# Patient Record
Sex: Female | Born: 1950 | Race: White | Hispanic: No | Marital: Married | Smoking: Current every day smoker
Health system: Southern US, Community
[De-identification: ages and names within clinical notes are randomized; demographics above are authoritative.]

## PROBLEM LIST (undated history)

## (undated) DIAGNOSIS — I219 Acute myocardial infarction, unspecified: Secondary | ICD-10-CM

## (undated) DIAGNOSIS — M8718 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, jaw: Secondary | ICD-10-CM

## (undated) DIAGNOSIS — I2510 Atherosclerotic heart disease of native coronary artery without angina pectoris: Secondary | ICD-10-CM

## (undated) DIAGNOSIS — I10 Essential (primary) hypertension: Secondary | ICD-10-CM

## (undated) DIAGNOSIS — I499 Cardiac arrhythmia, unspecified: Secondary | ICD-10-CM

## (undated) DIAGNOSIS — F17200 Nicotine dependence, unspecified, uncomplicated: Secondary | ICD-10-CM

## (undated) DIAGNOSIS — I639 Cerebral infarction, unspecified: Secondary | ICD-10-CM

## (undated) DIAGNOSIS — Z87442 Personal history of urinary calculi: Secondary | ICD-10-CM

## (undated) DIAGNOSIS — N200 Calculus of kidney: Secondary | ICD-10-CM

## (undated) DIAGNOSIS — K219 Gastro-esophageal reflux disease without esophagitis: Secondary | ICD-10-CM

## (undated) DIAGNOSIS — I739 Peripheral vascular disease, unspecified: Secondary | ICD-10-CM

## (undated) DIAGNOSIS — J449 Chronic obstructive pulmonary disease, unspecified: Secondary | ICD-10-CM

## (undated) DIAGNOSIS — E785 Hyperlipidemia, unspecified: Secondary | ICD-10-CM

## (undated) DIAGNOSIS — J189 Pneumonia, unspecified organism: Secondary | ICD-10-CM

## (undated) DIAGNOSIS — T7840XA Allergy, unspecified, initial encounter: Secondary | ICD-10-CM

## (undated) HISTORY — DX: Calculus of kidney: N20.0

## (undated) HISTORY — DX: 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, jaw: M87.180

## (undated) HISTORY — DX: Chronic obstructive pulmonary disease, unspecified: J44.9

## (undated) HISTORY — DX: Atherosclerotic heart disease of native coronary artery without angina pectoris: I25.10

## (undated) HISTORY — DX: Essential (primary) hypertension: I10

## (undated) HISTORY — DX: Allergy, unspecified, initial encounter: T78.40XA

## (undated) HISTORY — PX: CORONARY ARTERY BYPASS GRAFT: SHX141

## (undated) HISTORY — DX: Cerebral infarction, unspecified: I63.9

## (undated) HISTORY — PX: WISDOM TOOTH EXTRACTION: SHX21

## (undated) HISTORY — DX: Hyperlipidemia, unspecified: E78.5

## (undated) HISTORY — DX: Acute myocardial infarction, unspecified: I21.9

## (undated) HISTORY — DX: Nicotine dependence, unspecified, uncomplicated: F17.200

## (undated) HISTORY — PX: KIDNEY SURGERY: SHX687

---

## 1983-10-17 HISTORY — PX: OTHER SURGICAL HISTORY: SHX169

## 1995-05-16 DIAGNOSIS — I2510 Atherosclerotic heart disease of native coronary artery without angina pectoris: Secondary | ICD-10-CM

## 1995-05-16 HISTORY — DX: Atherosclerotic heart disease of native coronary artery without angina pectoris: I25.10

## 1998-03-17 ENCOUNTER — Ambulatory Visit (HOSPITAL_COMMUNITY): Admission: RE | Admit: 1998-03-17 | Discharge: 1998-03-17 | Payer: Self-pay | Admitting: Internal Medicine

## 1998-09-20 ENCOUNTER — Encounter: Payer: Self-pay | Admitting: Internal Medicine

## 1998-09-20 ENCOUNTER — Ambulatory Visit (HOSPITAL_COMMUNITY): Admission: RE | Admit: 1998-09-20 | Discharge: 1998-09-20 | Payer: Self-pay | Admitting: Internal Medicine

## 1998-12-27 ENCOUNTER — Other Ambulatory Visit: Admission: RE | Admit: 1998-12-27 | Discharge: 1998-12-27 | Payer: Self-pay | Admitting: *Deleted

## 1998-12-27 ENCOUNTER — Encounter (INDEPENDENT_AMBULATORY_CARE_PROVIDER_SITE_OTHER): Payer: Self-pay

## 2000-05-29 ENCOUNTER — Other Ambulatory Visit: Admission: RE | Admit: 2000-05-29 | Discharge: 2000-05-29 | Payer: Self-pay | Admitting: *Deleted

## 2000-06-06 ENCOUNTER — Encounter: Payer: Self-pay | Admitting: Internal Medicine

## 2000-06-06 ENCOUNTER — Encounter: Admission: RE | Admit: 2000-06-06 | Discharge: 2000-06-06 | Payer: Self-pay | Admitting: Internal Medicine

## 2000-06-11 ENCOUNTER — Other Ambulatory Visit: Admission: RE | Admit: 2000-06-11 | Discharge: 2000-06-11 | Payer: Self-pay | Admitting: *Deleted

## 2000-06-11 ENCOUNTER — Encounter (INDEPENDENT_AMBULATORY_CARE_PROVIDER_SITE_OTHER): Payer: Self-pay

## 2004-07-31 ENCOUNTER — Encounter: Admission: RE | Admit: 2004-07-31 | Discharge: 2004-07-31 | Payer: Self-pay | Admitting: Family Medicine

## 2005-10-16 ENCOUNTER — Emergency Department (HOSPITAL_COMMUNITY): Admission: EM | Admit: 2005-10-16 | Discharge: 2005-10-16 | Payer: Self-pay | Admitting: Emergency Medicine

## 2005-10-16 ENCOUNTER — Encounter: Payer: Self-pay | Admitting: Internal Medicine

## 2006-06-17 HISTORY — PX: COLONOSCOPY: SHX174

## 2006-11-05 ENCOUNTER — Ambulatory Visit: Payer: Self-pay | Admitting: Gastroenterology

## 2006-11-13 ENCOUNTER — Encounter: Admission: RE | Admit: 2006-11-13 | Discharge: 2006-11-13 | Payer: Self-pay | Admitting: Family Medicine

## 2006-11-21 ENCOUNTER — Ambulatory Visit: Payer: Self-pay | Admitting: Gastroenterology

## 2007-04-13 ENCOUNTER — Ambulatory Visit: Payer: Self-pay | Admitting: Vascular Surgery

## 2007-04-14 ENCOUNTER — Inpatient Hospital Stay (HOSPITAL_COMMUNITY): Admission: RE | Admit: 2007-04-14 | Discharge: 2007-04-15 | Payer: Self-pay | Admitting: Cardiovascular Disease

## 2007-04-18 HISTORY — PX: CAROTID ARTERY - SUBCLAVIAN ARTERY BYPASS GRAFT: SUR178

## 2007-05-01 ENCOUNTER — Ambulatory Visit: Payer: Self-pay | Admitting: Vascular Surgery

## 2010-03-07 ENCOUNTER — Encounter: Admission: RE | Admit: 2010-03-07 | Discharge: 2010-03-07 | Payer: Self-pay | Admitting: Family Medicine

## 2010-10-30 NOTE — Assessment & Plan Note (Signed)
OFFICE VISIT   [name], Jamie  DOB:  17-Apr-1951                                       05/01/2007  CHART#:[id number]   [name] was in today for follow-up of her left carotid  histoplavian bypass on 04/14/07.  She has done well since discharge from  the hospital.  Has had neurologic deficits and does not report any  fatigue in her left arm.  Her left neck incision is well healed.  She  does have palpable radial pulses bilaterally.  Her right arm blood  pressure is 135/72, left arm is 124/71, pulse 70, respirations 16.  O2  saturations are 99% on room air.  She remains neurologically intact.  She will be released to return to full work on 05/05/07 aside from heavy  lifting.  I will see her again in 6 months and we will arrange a carotid  duplex at [facility] prior to my visit with her in  six months.   Larina Earthly, M.D.  Electronically Signed   TFE/MEDQ  D:  05/01/2007  T:  05/04/2007  Job:  [id number]   cc:   Nanetta Batty, M.D.

## 2010-10-30 NOTE — Cardiovascular Report (Signed)
NAMESALLYANN, KINNAIRD NO.:  [id number]   MEDICAL RECORD NO.:  [id number]          PATIENT TYPE:  OBV   LOCATION:  6533                         FACILITY:  [facility]   PHYSICIAN:  Nanetta Batty, M.D.   DATE OF BIRTH:  08/04/1950   DATE OF PROCEDURE:  DATE OF DISCHARGE:                            CARDIAC CATHETERIZATION   Dictating a RCA aortogram, left subclavian artery angiogram, attempted  PTCA procedure.   Ms. Zender is a 60 year old white female, history of CAD status post  coronary bypass grafting x1 with a LIMA to her LAD in 1996.  She was  recently cathed by Dr. 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  She does complain of exertional chest pain in the left upper  extremity claudication (LIMA steal).  She presents now for potential  percutaneous/endovascular revascularization.   DESCRIPTION OF PROCEDURE:  The patient brought to the second floor [place]  of PV angiographic suite, in the postabsorptive state.  She was pre-  medicated with p.o. Valium.  Her right groin was prepped and shaved in  the usual sterile fashion.  One percent Xylocaine was used for local  anesthesia.  A 6-French sheath was inserted into the right femoral  artery, using the standard Seldinger technique.  A 6-French pigtail  catheter was used for arch angiography.  Visipaque dye was used for the  entirety of the case.  Retrograde aortic pressures were monitored during  the case.  The patient received a total of 3,000 units of heparin  peripherally and an additional 2000 units via the brachial side-arm  sheath.   Attempts to pass a Glidewire antegrade across the left subclavian were  unsuccessful.  Left brachial access was then obtained using a  micropuncture technique and a short 6-French sheath.  Heparin 2000 units  was administered through it's side arm and was sewn securely in place.  Using a  6-French short-guide catheter along with a 3.5 Wholey wire, a  angled Glidewire, a 4-French end-hole catheter and choice PT moderate  support, attempts were made retrograde recanalization.  The lumen was  visible; however, the wire was never able to be passed.  There was a  small to moderate-sized dissection on the lateral aspect of the  subclavian artery, which was contained. It did not encroach on the  vertebral or IMA.  The procedure was abandoned.  An ACT was measured.  The guide catheter was removed.  The sheaths were sewn securely in  place.  The patient left the lab in stable condition.  Both sheaths will  be removed, once the ACT falls below 170.  I will get the vein and  vascular surgeons to review her case.  The arch  aortogram did show a widely patent left common carotid, with no evidence  of bifurcation disease by duplex ultrasound.  She is a good candidate  for carotid subclavian bypass.  I have reviewed the films of with Dr.  Woodfin Ganja, as well.  Dr. Jenne Campus was notified of these results.  The  patient  left the lab in stable condition.      Nanetta Batty, M.D.  Electronically Signed     JB/MEDQ  D:  04/13/2007  T:  04/13/2007  Job:  [id number]   cc:   Patient Chart  PV Angiographic Suite 2nd Floor  [place]  [facility]  Dr. Frazier Richards, [facility]

## 2010-10-30 NOTE — Discharge Summary (Signed)
[name], [name] NO.:  [id number]   MEDICAL RECORD NO.:  [id number]          PATIENT TYPE:  INP   LOCATION:  3302                         FACILITY:  [facility]   PHYSICIAN:  [name], M.D.   DATE OF BIRTH:  1951/05/05   DATE OF ADMISSION:  04/13/2007  DATE OF DISCHARGE:  04/15/2007                               DISCHARGE SUMMARY   DISCHARGE DIAGNOSES:  1. Peripheral vascular disease, attempted left subclavian percutaneous      transluminal angioplasty this admission. The patient ultimately      required left carotid subclavian bypass by Dr. Arbie Cookey on April 14, 2007.  2. Coronary disease.  The patient had bypass surgery in 1996.  A      recent catheterization this year revealed a 70% lesion in the IMA      prior to the insertion site, ramus and obtuse marginal had no      significant disease, a 60% right coronary artery and a 99% osteal      left subclavian stenosis, with an ejection fraction of 60%.   HOSPITAL COURSE:  She is seen in the office in followup, and setup for  outpatient subclavian artery PTA attempt.  Plan for her coronary disease  just continue medical therapy.  On April 13, 2007, Dr. Allyson [name] attempted  subclavian artery PTA that was unsuccessful.  The case was discussed  with Dr. Arbie Cookey.  The patient was taken to the OR on April 14, 2007,  for a left common carotid artery to left subclavian artery bypass  grafting.  We feel she can be discharged on April 15, 2007.  She will  follow up with Dr. Allyson [name].      [name], P.A.      [name], M.D.  Electronically Signed    LKK/MEDQ  D:  04/15/2007  T:  04/15/2007  Job:  [id number]   cc:   Larina Earthly, M.D.

## 2010-10-30 NOTE — Op Note (Signed)
[name], [name] NO.:  [id number]   MEDICAL RECORD NO.:  [id number]          PATIENT TYPE:  OBV   LOCATION:  2550                         FACILITY:  [facility]   PHYSICIAN:  Larina Earthly, M.D.    DATE OF BIRTH:  [birth year]   DATE OF PROCEDURE:  04/14/2007  DATE OF DISCHARGE:                               OPERATIVE REPORT   PREOPERATIVE DIAGNOSIS:  Left subclavian occlusion.   POSTOPERATIVE DIAGNOSIS:  Left subclavian occlusion.   PROCEDURE:  Left common carotid to subclavian bypass with a 7 mm  Hemashield graft.   SURGEON:  Larina Earthly, M.D.   ASSISTANT:  Jerold Coombe, P.A.   ANESTHESIA:  General endotracheal.   COMPLICATIONS:  None.   DISPOSITION:  To recovery room stable   INDICATIONS FOR PROCEDURE:  The patient is a 60 year old white female  with premature atherosclerotic disease.  She is status post coronary  artery bypass grafting in the mid 1990s with a LIMA graft.  She was  found to have an concluded subclavian artery and had an attempted  crossing and angioplasty of this the day prior to this surgery and this  was unsuccessful.  She was admitted and is taken to the operating room  at this time for left carotid subclavian bypass.   PROCEDURE IN DETAIL:  The patient was taken to the operating room and  placed in the supine position.  The area of the left neck and chest was  prepped and draped in the usual sterile fashion. Incision was made above  the level of the clavicle and carried down through the platysma.  The  clavicular head of the sternocleidomastoid was divided.  The phrenic  nerve was identified and was preserved.  The subclavian artery was  isolated, it was of small caliber, it had minimal calcification.  The  common carotid artery was identified through the same incision.  The  vagus and jugular veins were identified and preserved.  A tunnel was  created anterior to the scalene muscles.  This was behind the level of  the  jugular vein.  The patient was given 6000 units of intravenous  heparin.  The common carotid artery was occluded proximally and  distally.  The patient's blood pressure was kept in the 150-160 systolic  range.  The common carotid artery was opened with an 11 blade and  extended  with Potts' scissors.  A 7 mm Hemashield graft was brought  onto the field and was sewn end-to-side to the common carotid artery  with a running 6-0 Prolene suture.  After the usual flushing maneuvers,  the clamp was placed on the graft and removed from the common carotid  artery.  The graft was then brought into approximation on the subclavian  artery.  The subclavian artery was occluded proximally and distally, and  this was distal to the level of the vertebral takeoff and distal to the  LIMA takeoff.  The artery was opened longitudinally.  The graft was cut  to the appropriate length, was spatulated, and sewn end-to-side to the  subclavian artery with a running 6-0 Prolene  suture.  Prior to  completion of the anastomosis, the usual flush maneuvers were  undertaken.  Anastomosis was completed and flow was restored to the left  subclavian.  The patient was given 50 mg of protamine to reverse the  heparin.  The wounds were irrigated with saline.  Hemostasis with  electrocautery.  The clavicular head of the sternocleidomastoid was  closed with several  interrupted 3-0 Vicryl sutures.  The platysma was closed with a running  3-0 Vicryl suture.  The skin was closed with a 4-0 subcuticular Vicryl  stitch.  Sterile dressing was applied and the patient was awakened  neurologically intact and was transferred to the recovery room in stable  condition.      Larina Earthly, M.D.  Electronically Signed     TFE/MEDQ  D:  04/14/2007  T:  04/14/2007  Job:  [id number]   cc:   Nanetta Batty, M.D.  Darlin Priestly, MD

## 2010-10-30 NOTE — Discharge Summary (Signed)
[name], [name] NO.:  [id number]   MEDICAL RECORD NO.:  [id number]          PATIENT TYPE:  INP   LOCATION:  3302                         FACILITY:  [facility]   PHYSICIAN:  Larina Earthly, M.D.    DATE OF BIRTH:  August 04, 1950   DATE OF ADMISSION:  04/13/2007  DATE OF DISCHARGE:  04/15/2007                               DISCHARGE SUMMARY   DISCHARGE DIAGNOSIS:  Subtotal occlusion of left subclavian artery.   SECONDARY DIAGNOSES:  1. Left subclavian occlusion with unsuccessful angioplasty and now      status post left common carotid to subclavian artery bypass.  2. History of coronary artery disease, status post coronary artery      bypass grafting with the left internal mammary artery to the left      anterior descending with retrograde filling.  3. Ongoing tobacco use.  4. Dyslipidemia.   ALLERGIES:  NO KNOWN DRUG ALLERGIES.   PROCEDURE:  1. On April 13, 2007, she had arterial aortogram type 1 showing      99.9% left subclavian artery stenosis with retrograde left      ventricular filling.  She had attempted  angioplasty, but Dr. Allyson Sabal was unable to cross the lesion.  1. On April 14, 2007, left common carotid to subclavian artery      bypass using 7-mm Hemashield graft by Dr. Gretta Began.   BRIEF HISTORY:  [name] is a 60 year old white female with premature  atherosclerotic disease.  She is status post coronary artery bypass  graft in the mid 1990s with a LIMA graft.  Recent catheterization, she  was found to have occluded subclavian artery (99%.)  She reportedly had  complained of decreased strength her left upper arm and was noted to  have a 30 mmHg pressure difference within the right and left arm.  She  had no resting ischemia.  Therefore, Dr. Jenne Campus recommended that she  undergo left subclavian angiogram with possible PTA and stenting by his  partner Dr. Nanetta Batty.  She was started on Plavix in preparation  for this procedure.   HOSPITAL  COURSE:  [name] was admitted to [facility]  initially under the care of Dr. Nanetta Batty.  She was admitted on  April 13, 2007.  She underwent arteriogram as discussed above.  Dr.  Allyson Sabal was unable to perform angioplasty on her left subclavian artery.  Therefore, a vascular surgery consultation was requested.  She was seen  by Dr. Tawanna Cooler Early.  It was noted that with reviewing of her prior  arteriogram films, he noted dissection of the subclavian artery distal  to the occlusion.  Therefore, he felt the left carotid to subclavian  artery bypass would be the best treatment option for her symptoms.  This  was performed on April 14, 2007.  Postoperatively, she was transferred  to stepdown unit 3300 and at this point remained hemodynamically stable.  Her left hand remains well perfused.  Neurologically, she was intact.  Her vitals are stable.   LABORATORY DATA:  Postoperative labs show a sodium of 138, potassium  3.5, chloride 105, CO2  28, blood glucose 0.7, BUN 5, creatinine 0.51,  white count 7.1, hemoglobin 1.5, hematocrit 34.2, platelet count 195.   Postoperative, it is felt that if she tolerates breakfast, ambulates  without difficulty, voids following removal of her Foley catheter and  there are no significant changes in her status, that she will be able to  be discharged later on postoperative day 1, April 15, 2007.  Currently, she remains in stable condition.   DISCHARGE MEDICATIONS:  1. Oxycodone 5 mg 1-2 tablets p.o. q.4 h. p.r.n. pain.  2. Simvastatin 20 mg q.h.s.  3. Lexapro 10 mg p.o. daily.  4. Aspirin 325 mg daily.  5. Metoprolol ER 25 mg p.o. daily.  6. Multivitamin p.o. daily.  7. Plavix 75 mg p.o. daily unless otherwise directed by her      cardiologist.   DISCHARGE INSTRUCTIONS:  1. She is to resume a heart-healthy diet.  2. Increase her activity slowly.  3. Avoid driving or heavy lifting for the next 2-3 weeks.  4. She may shower and clean her  incisions gently with soap and water.  5. She should call if she develops fever greater than 101, redness or      drainage from her incision sites, increased pain or numbness in her      left hand.   FOLLOW UP:  1. She is to see Dr. Arbie Cookey in approximately 2 weeks.  Our office will      contact her regarding specific appointment date and time.  2. She should follow up with Dr. Allyson Sabal or Dr. Jenne Campus as directed.      Jerold Coombe, P.A.      Larina Earthly, M.D.  Electronically Signed    AWZ/MEDQ  D:  04/15/2007  T:  04/15/2007  Job:  [id number]   cc:   Larina Earthly, M.D.  Darlin Priestly, MD  Nanetta Batty, M.D.  [address] [place], [place]

## 2011-03-27 LAB — BASIC METABOLIC PANEL
BUN: 5 — ABNORMAL LOW
BUN: 9
CO2: 27
Calcium: 9.6
Calcium: 9.8
Creatinine, Ser: 0.51
GFR calc non Af Amer: >60
Glucose, Bld: 107 — ABNORMAL HIGH
Glucose, Bld: 90
Potassium: 4
Sodium: 142

## 2011-03-27 LAB — CBC
HCT: 36.8
Hemoglobin: 12.5
MCHC: 34
Platelets: 195
Platelets: 222
RDW: 15.1 — ABNORMAL HIGH
RDW: 15.2 — ABNORMAL HIGH
WBC: 7.1

## 2015-01-18 ENCOUNTER — Encounter: Payer: Self-pay | Admitting: Family Medicine

## 2015-01-23 ENCOUNTER — Ambulatory Visit: Payer: BLUE CROSS/BLUE SHIELD | Admitting: Physician Assistant

## 2015-01-30 ENCOUNTER — Ambulatory Visit (INDEPENDENT_AMBULATORY_CARE_PROVIDER_SITE_OTHER): Payer: BLUE CROSS/BLUE SHIELD | Admitting: Physician Assistant

## 2015-01-30 ENCOUNTER — Encounter: Payer: Self-pay | Admitting: Physician Assistant

## 2015-01-30 VITALS — BP 130/70 | HR 76 | Temp 98.0°F | Resp 16 | Ht 66.0 in | Wt 98.0 lb

## 2015-01-30 DIAGNOSIS — E559 Vitamin D deficiency, unspecified: Secondary | ICD-10-CM | POA: Diagnosis not present

## 2015-01-30 DIAGNOSIS — Z23 Encounter for immunization: Secondary | ICD-10-CM

## 2015-01-30 DIAGNOSIS — Z0000 Encounter for general adult medical examination without abnormal findings: Secondary | ICD-10-CM

## 2015-01-30 DIAGNOSIS — J439 Emphysema, unspecified: Secondary | ICD-10-CM | POA: Diagnosis not present

## 2015-01-30 DIAGNOSIS — M810 Age-related osteoporosis without current pathological fracture: Secondary | ICD-10-CM | POA: Insufficient documentation

## 2015-01-30 DIAGNOSIS — Z951 Presence of aortocoronary bypass graft: Secondary | ICD-10-CM | POA: Insufficient documentation

## 2015-01-30 DIAGNOSIS — Z9889 Other specified postprocedural states: Secondary | ICD-10-CM

## 2015-01-30 DIAGNOSIS — I708 Atherosclerosis of other arteries: Secondary | ICD-10-CM

## 2015-01-30 DIAGNOSIS — I771 Stricture of artery: Secondary | ICD-10-CM | POA: Insufficient documentation

## 2015-01-30 DIAGNOSIS — E785 Hyperlipidemia, unspecified: Secondary | ICD-10-CM | POA: Diagnosis not present

## 2015-01-30 DIAGNOSIS — Z905 Acquired absence of kidney: Secondary | ICD-10-CM

## 2015-01-30 DIAGNOSIS — I2510 Atherosclerotic heart disease of native coronary artery without angina pectoris: Secondary | ICD-10-CM

## 2015-01-30 DIAGNOSIS — Z87442 Personal history of urinary calculi: Secondary | ICD-10-CM

## 2015-01-30 DIAGNOSIS — Z720 Tobacco use: Secondary | ICD-10-CM | POA: Diagnosis not present

## 2015-01-30 DIAGNOSIS — R5383 Other fatigue: Secondary | ICD-10-CM | POA: Diagnosis not present

## 2015-01-30 DIAGNOSIS — Z95828 Presence of other vascular implants and grafts: Secondary | ICD-10-CM

## 2015-01-30 DIAGNOSIS — F17200 Nicotine dependence, unspecified, uncomplicated: Secondary | ICD-10-CM | POA: Insufficient documentation

## 2015-01-30 LAB — CBC WITH DIFFERENTIAL/PLATELET
Basophils Absolute: 0.1 10*3/uL (ref 0.0–0.1)
Basophils Relative: 1 % (ref 0–1)
EOS ABS: 0.1 10*3/uL (ref 0.0–0.7)
EOS PCT: 2 % (ref 0–5)
HCT: 45.4 % (ref 36.0–46.0)
Hemoglobin: 15.2 g/dL — ABNORMAL HIGH (ref 12.0–15.0)
LYMPHS ABS: 1.6 10*3/uL (ref 0.7–4.0)
Lymphocytes Relative: 25 % (ref 12–46)
MCH: 32 pg (ref 26.0–34.0)
MCHC: 33.5 g/dL (ref 30.0–36.0)
MCV: 95.6 fL (ref 78.0–100.0)
MONO ABS: 0.5 10*3/uL (ref 0.1–1.0)
MONOS PCT: 8 % (ref 3–12)
MPV: 8.7 fL (ref 8.6–12.4)
Neutro Abs: 4.1 10*3/uL (ref 1.7–7.7)
Neutrophils Relative %: 64 % (ref 43–77)
PLATELETS: 272 10*3/uL (ref 150–400)
RBC: 4.75 MIL/uL (ref 3.87–5.11)
RDW: 14.7 % (ref 11.5–15.5)
WBC: 6.4 10*3/uL (ref 4.0–10.5)

## 2015-01-30 LAB — COMPLETE METABOLIC PANEL WITH GFR
ALT: 6 U/L (ref 6–29)
AST: 21 U/L (ref 10–35)
Albumin: 3.9 g/dL (ref 3.6–5.1)
Alkaline Phosphatase: 45 U/L (ref 33–130)
BILIRUBIN TOTAL: 0.5 mg/dL (ref 0.2–1.2)
BUN: 16 mg/dL (ref 7–25)
CO2: 27 mmol/L (ref 20–31)
Calcium: 10.3 mg/dL (ref 8.6–10.4)
Chloride: 106 mmol/L (ref 98–110)
Creat: 0.79 mg/dL (ref 0.50–0.99)
GFR, EST NON AFRICAN AMERICAN: 80 mL/min (ref >=60)
Glucose, Bld: 85 mg/dL (ref 70–99)
Potassium: 5.9 mmol/L — ABNORMAL HIGH (ref 3.5–5.3)
Sodium: 139 mmol/L (ref 135–146)
TOTAL PROTEIN: 6.6 g/dL (ref 6.1–8.1)

## 2015-01-30 LAB — LIPID PANEL
Cholesterol: 201 mg/dL — ABNORMAL HIGH (ref 125–200)
HDL: 71 mg/dL (ref >=46)
LDL CALC: 119 mg/dL (ref <130)
TRIGLYCERIDES: 53 mg/dL (ref <150)
Total CHOL/HDL Ratio: 2.8 Ratio (ref <=5.0)
VLDL: 11 mg/dL (ref <30)

## 2015-01-30 NOTE — Progress Notes (Signed)
Patient ID: [name] MRN: [id number], DOB: [birth year], 64 y.o. Date of Encounter: @DATE @  Chief Complaint:  Chief Complaint  Patient presents with  . Follow-up    Re-Establish Care    HPI: 64 y.o. year old white female  presents to re-establish care.  01/30/2015: I REVIEWED HER PAPER CHART AND ABSTRACTED ALL IMPORTANT INFORMATION INTO EPIC  Her last office visit here in this office was 07/06/2010. She reports that since then, she has had no follow-up and no medical care. She says that any time she is around her friends-- they seem to have all types of aches and pains and problems-- and that she felt that she was in much better shape than any of them so she felt that she really did not need to be coming in. Says that because of her bypass surgery and having a kidney removed in the past, that she has been eating a very healthy diet because of the instructions she was given at the time of those surgeries. Says that she has continued to follow the diet given back then. Says that she has a stationary bike and she actually uses hers!! Says that she mows her grass using a riding mower as well as a push mower. Says that she has continued all of these habits and is very active and has been feeling fine so she felt that she could just go without coming into a doctor's office.  However, she recently decided that she probably better come in and get checked just to make sure.  States that she has been having no angina type symptoms. States that she has been feeling well.   Past Medical History/Problem List:   1. Atherosclerosis of native coronary artery of native heart without angina pectoris  2. History of coronary artery bypass surgery 05/20/1995 she underwent coronary artery bypass grafting by Dr. Particia Lather with a LIMA to the LAD. Cardiac catheterization had revealed 95% proximal LAD lesion too close to the left main for endovascular interventional treatment.  CABG was indicated  after a positive stress test, for relief of symptoms, and for myocardial preservation.  3. Subclavian artery stenosis, left She had complained of some mild chest discomfort so underwent a Cardiolite scan 02/23/2007. This revealed evidence of mild anteroseptal ischemia with normal EF. She was also noted to have unequal upper extremity blood pressures on her initial visit with Dr. Jenne Campus with blood pressure 120/70 in the right arm and 90/60 in the left arm. This raised suspicion for left subclavian stenosis.  She underwent left subclavian ultrasound 03/05/2007. Revealed widely patent right and left internal carotids. There were abnormal waveform in the left subclavian suggesting > 50% stenosis. It was felt that she had probable left subclavian stenosis which was concerning that may be compromising her LIMA to her LAD given her abnormal Cardiolite scan. She underwent cardiac catheterization 03/27/2007 revealing 50% proximal LAD lesion. The IMA was visualized with 70% lesion in the IMA prior to the insertion site. The ramus and OM had no significant disease. There was 60% lesion in the ostial portion of the RCA. This showed to have 99% ostial left subclavian stenosis with calcification and TIMI 1 flow into the left subclavian. She was scheduled to undergo left subclavian angiogram with possible PTA/stenting by Dr. Allyson Sabal on 04/13/2007. However, failed percutaneous revascularization.  Had left carotid -  subclavian bypass in 2008.  4. S/P vascular bypass ------ left carotid -  subclavian bypass in 2008.  5. Hyperlipidemia When she was  seeing me in the past she was on Crestor 20 mg daily.  6. Smoker  7. Vitamin D deficiency  8. Osteoporosis DEXA scan 03/07/2010 showed: Lumbar spine----- T score -2.5 Left femur---------- T score -1.6 Patient's diagnostic category was osteoporosis by criteria. At that time, she was prescribed Fosamax 70 mg 1 every week. She was also to take 1200 mg calcium daily.  August 2011 vitamin D level had been low and she was given prescription strength vitamin D and was to take this.    Home Meds: None  Allergies: No Known Allergies  Social History   Social History  . Marital Status: Married    Spouse Name: N/A  . Number of Children: N/A  . Years of Education: N/A   Occupational History  . Not on file.   Social History Main Topics  . Smoking status: Current Every Day Smoker -- 0.25 packs/day for 40 years    Types: Cigarettes  . Smokeless tobacco: Not on file  . Alcohol Use: Not on file  . Drug Use: No  . Sexual Activity: Yes   Other Topics Concern  . Not on file   Social History Narrative    Family History  Problem Relation Age of Onset  . Depression Sister      Review of Systems:  See HPI for pertinent ROS. All other ROS negative.    Physical Exam: Blood pressure 130/70, pulse 76, temperature 98 F (36.7 C), temperature source Oral, resp. rate 16, height  (1.676 m), weight 98 lb (44.453 kg)., Body mass index is 15.83 kg/(m^2). General: Thin, WNWD WF. Appears in no acute distress. Neck: Supple. No thyromegaly. No lymphadenopathy. She has soft carotid bruits bilaterally. She has a soft left subclavian bruit as well. Lungs: Clear bilaterally to auscultation without wheezes, rales, or rhonchi. Breathing is unlabored. Heart: RRR with S1 S2. No murmurs, rubs, or gallops. Abdomen: Soft, non-tender, non-distended with normoactive bowel sounds. No hepatomegaly. No rebound/guarding. No obvious abdominal masses. Musculoskeletal:  Strength and tone normal for age. Extremities/Skin: Warm and dry. No edema.  Neuro: Alert and oriented X 3. Moves all extremities spontaneously. Gait is normal. CNII-XII grossly in tact. Psych:  Responds to questions appropriately with a normal affect.     ASSESSMENT AND PLAN:  64 y.o. year old female with   1. Atherosclerosis of native coronary artery of native heart without angina pectoris - Ambulatory  referral to Cardiology--- added comments to schedule this with Dr. Allyson Sabal as patient has seen him in the past and he will have her records.  2. History of coronary artery bypass surgery - Ambulatory referral to Cardiology--- added comments to schedule this with Dr. Allyson Sabal as patient has seen him in the past and he will have her records.  3. Subclavian artery stenosis, left - Ambulatory referral to Cardiology--- added comments to schedule this with Dr. Allyson Sabal as patient has seen him in the past and he will have her records.  4. S/P vascular bypass - Ambulatory referral to Cardiology--- added comments to schedule this with Dr. Allyson Sabal as patient has seen him in the past and he will have her records.  5. Hyperlipidemia - COMPLETE METABOLIC PANEL WITH GFR - Lipid panel - Ambulatory referral to Cardiology  6. Smoker - Ambulatory referral to Cardiology ----------------------------NEED TO DISCUSS CESSATION, CHANTIX AT NEXT OV----------------------------------  7. Vitamin D deficiency - Vit D  25 hydroxy (rtn osteoporosis monitoring)  8. Osteoporosis - Vit D  25 hydroxy (rtn osteoporosis monitoring)   9.  Other fatigue - CBC with Differential/Platelet - COMPLETE METABOLIC PANEL WITH GFR - TSH  Screening Labs:  CBC, CMET, FLP, TSH, Vitamin D---checked 01/30/2015  10. Immunizations:  She is agreeable to receive a T dap today as well as an Pneumovax 23 today.----These are both given here 01/30/2015.  She is agreeable to schedule a complete physical exam with me in the upcoming 1-2 weeks. She is fasting today so we went ahead and did full set of labs today and gave above immunizations.  Will update further preventive care at her upcoming CPE. Also will further discuss Zostavax at that visit. Also will do pelvic exam with Pap smear, order mammogram, order DEXA scan.  01/30/2015: I REVIEWED HER PAPER CHART AND ABSTRACTED ALL IMPORTANT INFORMATION INTO EPIC  Signed, Shon Hale In[place], [place],  [facility] 01/30/2015 12:07 PM

## 2015-01-31 LAB — TSH: TSH: 0.811 u[IU]/mL (ref 0.350–4.500)

## 2015-01-31 LAB — VITAMIN D 25 HYDROXY (VIT D DEFICIENCY, FRACTURES): Vit D, 25-Hydroxy: 21 ng/mL — ABNORMAL LOW (ref 30–100)

## 2015-02-01 ENCOUNTER — Encounter: Payer: Self-pay | Admitting: Physician Assistant

## 2015-02-01 DIAGNOSIS — Z905 Acquired absence of kidney: Secondary | ICD-10-CM | POA: Insufficient documentation

## 2015-02-01 DIAGNOSIS — J449 Chronic obstructive pulmonary disease, unspecified: Secondary | ICD-10-CM | POA: Insufficient documentation

## 2015-02-01 DIAGNOSIS — Z87442 Personal history of urinary calculi: Secondary | ICD-10-CM | POA: Insufficient documentation

## 2015-02-03 ENCOUNTER — Other Ambulatory Visit: Payer: Self-pay | Admitting: *Deleted

## 2015-02-03 DIAGNOSIS — E785 Hyperlipidemia, unspecified: Secondary | ICD-10-CM

## 2015-02-03 MED ORDER — SIMVASTATIN 20 MG PO TABS: 20.0000 mg | ORAL_TABLET | Freq: Every day | ORAL | Status: DC

## 2015-02-13 ENCOUNTER — Encounter: Payer: Self-pay | Admitting: Physician Assistant

## 2015-02-13 ENCOUNTER — Ambulatory Visit (INDEPENDENT_AMBULATORY_CARE_PROVIDER_SITE_OTHER): Payer: BLUE CROSS/BLUE SHIELD | Admitting: Physician Assistant

## 2015-02-13 VITALS — BP 120/80 | HR 76 | Temp 97.6°F | Resp 18 | Wt 98.0 lb

## 2015-02-13 DIAGNOSIS — J439 Emphysema, unspecified: Secondary | ICD-10-CM | POA: Diagnosis not present

## 2015-02-13 DIAGNOSIS — I2510 Atherosclerotic heart disease of native coronary artery without angina pectoris: Secondary | ICD-10-CM

## 2015-02-13 DIAGNOSIS — Z0000 Encounter for general adult medical examination without abnormal findings: Secondary | ICD-10-CM

## 2015-02-13 DIAGNOSIS — Z87442 Personal history of urinary calculi: Secondary | ICD-10-CM

## 2015-02-13 DIAGNOSIS — I708 Atherosclerosis of other arteries: Secondary | ICD-10-CM | POA: Diagnosis not present

## 2015-02-13 DIAGNOSIS — E785 Hyperlipidemia, unspecified: Secondary | ICD-10-CM

## 2015-02-13 DIAGNOSIS — F17200 Nicotine dependence, unspecified, uncomplicated: Secondary | ICD-10-CM

## 2015-02-13 DIAGNOSIS — M810 Age-related osteoporosis without current pathological fracture: Secondary | ICD-10-CM | POA: Diagnosis not present

## 2015-02-13 DIAGNOSIS — Z95828 Presence of other vascular implants and grafts: Secondary | ICD-10-CM

## 2015-02-13 DIAGNOSIS — E559 Vitamin D deficiency, unspecified: Secondary | ICD-10-CM | POA: Diagnosis not present

## 2015-02-13 DIAGNOSIS — Z905 Acquired absence of kidney: Secondary | ICD-10-CM | POA: Diagnosis not present

## 2015-02-13 DIAGNOSIS — I771 Stricture of artery: Secondary | ICD-10-CM

## 2015-02-13 DIAGNOSIS — Z9889 Other specified postprocedural states: Secondary | ICD-10-CM

## 2015-02-13 DIAGNOSIS — Z720 Tobacco use: Secondary | ICD-10-CM | POA: Diagnosis not present

## 2015-02-13 DIAGNOSIS — Z951 Presence of aortocoronary bypass graft: Secondary | ICD-10-CM

## 2015-02-13 NOTE — Progress Notes (Signed)
Patient ID: [name] MRN: [id number], DOB: [birth year], 64 y.o. Date of Encounter: @DATE @  Chief Complaint:  Chief Complaint  Patient presents with  . Follow-up    CPE    HPI: 64 y.o. year old white female  Presents for CPE.   THE FOLLOWING IS COPIED FROM HER OV NOTE WITH ME 01/30/2015:    01/30/2015: I REVIEWED HER PAPER CHART AND ABSTRACTED ALL IMPORTANT INFORMATION INTO EPIC  She presents to re-establish care. Her last office visit here in this office was 07/06/2010. She reports that since then, she has had no follow-up and no medical care. She says that any time she is around her friends-- they seem to have all types of aches and pains and problems-- and that she felt that she was in much better shape than any of them so she felt that she really did not need to be coming in. Says that because of her bypass surgery and having a kidney removed in the past, that she has been eating a very healthy diet because of the instructions she was given at the time of those surgeries. Says that she has continued to follow the diet given back then. Says that she has a stationary bike and she actually uses hers!! Says that she mows her grass using a riding mower as well as a push mower. Says that she has continued all of these habits and is very active and has been feeling fine so she felt that she could just go without coming into a doctor's office.  However, she recently decided that she probably better come in and get checked just to make sure.  States that she has been having no angina type symptoms. States that she has been feeling well.   AT THAT INITIAL OV ON 01/30/2015: Checked Full Panel of Labs Ordered Referral to Cardiology Gave Tdap, Pneumovax 23 Discussed her returning for CPE and she was agreeable.  I reviewed her paper chart and abstracted information into Epic   02/13/2015--Today-- She presents for CPE. Has no complaints or concerns to address.     Past Medical  History/Problem List:   1. Atherosclerosis of native coronary artery of native heart without angina pectoris  2. History of coronary artery bypass surgery 05/20/1995 she underwent coronary artery bypass grafting by Dr. Particia Lather with a LIMA to the LAD. Cardiac catheterization had revealed 95% proximal LAD lesion too close to the left main for endovascular interventional treatment.  CABG was indicated after a positive stress test, for relief of symptoms, and for myocardial preservation.  3. Subclavian artery stenosis, left She had complained of some mild chest discomfort so underwent a Cardiolite scan 02/23/2007. This revealed evidence of mild anteroseptal ischemia with normal EF. She was also noted to have unequal upper extremity blood pressures on her initial visit with Dr. Jenne Campus with blood pressure 120/70 in the right arm and 90/60 in the left arm. This raised suspicion for left subclavian stenosis.  She underwent left subclavian ultrasound 03/05/2007. Revealed widely patent right and left internal carotids. There were abnormal waveform in the left subclavian suggesting > 50% stenosis. It was felt that she had probable left subclavian stenosis which was concerning that may be compromising her LIMA to her LAD given her abnormal Cardiolite scan. She underwent cardiac catheterization 03/27/2007 revealing 50% proximal LAD lesion. The IMA was visualized with 70% lesion in the IMA prior to the insertion site. The ramus and OM had no significant disease. There was 60% lesion in  the ostial portion of the RCA. This showed to have 99% ostial left subclavian stenosis with calcification and TIMI 1 flow into the left subclavian. She was scheduled to undergo left subclavian angiogram with possible PTA/stenting by Dr. Allyson Sabal on 04/13/2007. However, failed percutaneous revascularization.  Had left carotid -  subclavian bypass in 2008.  4. S/P vascular bypass ------ left carotid -  subclavian bypass in  2008.  5. Hyperlipidemia When she was seeing me in the past she was on Crestor 20 mg daily.  6. Smoker  7. Vitamin D deficiency  8. Osteoporosis DEXA scan 03/07/2010 showed: Lumbar spine----- T score -2.5 Left femur---------- T score -1.6 Patient's diagnostic category was osteoporosis by criteria. At that time, she was prescribed Fosamax 70 mg 1 every week. She was also to take 1200 mg calcium daily. August 2011 vitamin D level had been low and she was given prescription strength vitamin D and was to take this.    Home Meds: None  Allergies: No Known Allergies  Social History   Social History  . Marital Status: Married    Spouse Name: N/A  . Number of Children: N/A  . Years of Education: N/A   Occupational History  . Not on file.   Social History Main Topics  . Smoking status: Current Every Day Smoker -- 0.25 packs/day for 40 years    Types: Cigarettes  . Smokeless tobacco: Not on file  . Alcohol Use: Not on file  . Drug Use: No  . Sexual Activity: Yes   Other Topics Concern  . Not on file   Social History Narrative    Family History  Problem Relation Age of Onset  . Depression Sister   . COPD Father   . Cancer Sister     hysterectomy sec to cancer  . Cancer Sister     cervical cancer  . COPD Brother      Review of Systems:  See HPI for pertinent ROS. All other ROS negative.    Physical Exam: Blood pressure 120/80, pulse 76, temperature 97.6 F (36.4 C), temperature source Oral, resp. rate 18, weight 98 lb (44.453 kg)., Body mass index is 15.83 kg/(m^2). General: Thin, WNWD WF. Appears in no acute distress. HEENT: Eye Exam Normal. Ears: Bilateral Ear canals with desquamation, peeling skin. Mouth: Oral Mucosa normal. Posterior Pharynx normal.  Neck: Supple. No thyromegaly. No lymphadenopathy. She has soft carotid bruits bilaterally. She has a soft left subclavian bruit as well. Lungs: Clear bilaterally to auscultation without wheezes, rales, or  rhonchi. Breathing is unlabored. Heart: RRR with S1 S2. No murmurs, rubs, or gallops. Breast Exam: Normal Bilaterally. No masses. No nipple discharge. No skin changes.  Abdomen: Soft, non-tender, non-distended with normoactive bowel sounds. No hepatomegaly. No rebound/guarding. No obvious abdominal masses. Musculoskeletal:  Strength and tone normal for age. Pelvic Exam: External Genitalia: Normal. Vaginal Mucosa: Normal. Cervix: Normal. Bimanual Exam: Normal. Normal uterus size. No adnexal mass, no tenderness.  Extremities/Skin: Warm and dry. No edema. I noticed she has desquamation/peeling/ in bilateral ear canals--asked pt if she has other areas---she pulls back her bangs and shows me a patch of peeling/desquamation at upper forehead/hairline. I also noticed patches of rash on bilateral anterolateral ankles and a few other scattered patches as well. Neuro: Alert and oriented X 3. Moves all extremities spontaneously. Gait is normal. CNII-XII grossly in tact. Psych:  Responds to questions appropriately with a normal affect.     ASSESSMENT AND PLAN:  64 y.o. year old female with  1. Visit for preventive health examination  A. Screening Labs: 01/30/2015--Did Labs: CBC--------Normal CMET-----Normal FLP--------LDL--119--started Simvastatin ---At OV 02/13/15-she says she IS taking this TSH-------Normal Vit D------21--Recommended to start otc Vit D 2,000 IU QD---At OV 02/13/15- she says she IS taking this  B. Pap Smear: Pap Smear sent 02/13/2015 - PAP, Thin Prep w/HPV rflx HPV Type 16/18  C. Screening Mammogram: At CPE 02/13/2015---she is agreeable for me to schedule f/u Mammogram - MM Digital Screening; Future  D. DEXA: At CPE 02/13/2015--she is agreeable for me to schedule f/u DEXA - DG Bone Density; Future  E. Screening for Colorectal Cancer: Pt reports she has had one colonoscopy in past---was performed at [facility] I CHECKED EPIC---SEE NO MENTION OF THIS IN ENCOUNTERS,  PROCEDURES, ETC I CHECKED PAPER CHART---CANNOT FIND COPY OF THIS AT NEXT OV--WILL HAVE PT SIGN RELEASE TO GET RECORD/FIND OUT DATE OF LAST COLONOSCOPY  F. Immunizations:  Influenza Vaccine--------------N/A---August T dap-----------------------------Given here 01/30/2015 Pneumovax 23 -----------------Given here 01/30/2015. No further Pneumonia Vaccine indicated until age 42 Zostavax:   I Wrote this on her AVS 02/13/2015--to remind her--to call insurance regarding coverage, then call us and let us know.     Rash:  See "Skin" Physical Exam Findings Documented above.----------------------WILL ADDRESS THIS AT NEXT OV----------------------------------    1. Atherosclerosis of native coronary artery of native heart without angina pectoris -I ordered Referral at OV 01/30/2015---- Ambulatory referral to Cardiology--- added comments to schedule this with Dr. Allyson Sabal as patient has seen him in the past and he will have her records.  2. History of coronary artery bypass surgery - -I ordered Referral at OV 01/30/2015-Ambulatory referral to Cardiology--- added comments to schedule this with Dr. Allyson Sabal as patient has seen him in the past and he will have her records.  3. Subclavian artery stenosis, left --I ordered Referral at OV 01/30/2015- Ambulatory referral to Cardiology--- added comments to schedule this with Dr. Allyson Sabal as patient has seen him in the past and he will have her records.  4. S/P vascular bypass --I ordered Referral at OV 01/30/2015- Ambulatory referral to Cardiology--- added comments to schedule this with Dr. Allyson Sabal as patient has seen him in the past and he will have her records.  5. Hyperlipidemia - COMPLETE METABOLIC PANEL WITH GFR - Lipid panel  6. Smoker At OV 02/13/2015--Discussed Medication for Cessation She says she wants to work on "cutting back on her own first"----"if that doesn't work, then would try medication to help with this"--------------F/U AT NEXT  OV------------------------  7. Vitamin D deficiency At OV 01/30/2015--Checked Vitamin D Level---Was low at 21---At f/u OV she confirms that she IS taking the otc Vitamin D 2,000 IU as directed - DG Bone Density; Future  8. Osteoporosis At OV 01/30/2015--Checked Vitamin D Level---Was low at 21---At f/u OV she confirms that she IS taking the otc Vitamin D 2,000 IU as directed - DG Bone Density; Future She is a smoker. --Review of Paper Chart: DEXA scan 03/07/2010 showed: Lumbar spine----- T score -2.5 Left femur---------- T score -1.6 Patient's diagnostic category was osteoporosis by criteria. At that time, she was prescribed Fosamax 70 mg 1 every week. She was also to take 1200 mg calcium daily. August 2011 vitamin D level had been low and she was given prescription strength vitamin D and was to take this.  9. Solitary kidney, acquired 10. 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 Dr. Etta Grandchild. Staghorn Calculus, Left Kidney Nonfunctioning, With Acute and Chronic Pyelonephritis  At CPE 02/13/2015---Discussed with pt ---Needs f/u  with Urology--- She agrees to f/u with them in future, but wants to wait for me to order this referral at next OV---Right now she is going to Cardiology appt, Mammo, DEXA--------------------------------ORDER THIS AT NEXT OV------------------------------------------------------------------------   01/30/2015: I REVIEWED HER PAPER CHART AND ABSTRACTED ALL IMPORTANT INFORMATION INTO EPIC  WILL PLAN F/U OV 3 MONTHS, SOONER IF NEEDED. SHE IS AWARE TO RETURN FASTING FOR FLP/LFT 6 WEEKS AFTER STARTING [address] [place], [place], [facility] 02/13/2015 8:47 AM

## 2015-02-17 LAB — PAP, THIN PREP W/HPV RFLX HPV TYPE 16/18: HPV DNA HIGH RISK: NOT DETECTED

## 2015-02-22 ENCOUNTER — Encounter: Payer: Self-pay | Admitting: Family Medicine

## 2015-03-01 ENCOUNTER — Encounter: Payer: Self-pay | Admitting: Cardiology

## 2015-03-01 ENCOUNTER — Ambulatory Visit (INDEPENDENT_AMBULATORY_CARE_PROVIDER_SITE_OTHER): Payer: BLUE CROSS/BLUE SHIELD | Admitting: Cardiology

## 2015-03-01 VITALS — BP 168/100 | HR 84 | Ht 66.0 in | Wt 96.5 lb

## 2015-03-01 DIAGNOSIS — Z951 Presence of aortocoronary bypass graft: Secondary | ICD-10-CM | POA: Diagnosis not present

## 2015-03-01 DIAGNOSIS — I2510 Atherosclerotic heart disease of native coronary artery without angina pectoris: Secondary | ICD-10-CM | POA: Diagnosis not present

## 2015-03-01 NOTE — Patient Instructions (Signed)
Your physician wants you to follow-up in: 1 Year. You will receive a reminder letter in the mail two months in advance. If you don't receive a letter, please call our office to schedule the follow-up appointment.  Your physician has requested that you have an exercise tolerance test. For further information please visit [email or web address]. Please also follow instruction sheet, as given.    

## 2015-03-01 NOTE — Progress Notes (Signed)
Cardiology Office Note   Date:  03/01/2015   ID:  [name], DOB 1951-05-25, MRN [id number]  PCP:  Frazier Richards, PA-C  Cardiologist:   Rollene Rotunda, MD   Chief Complaint  Patient presents with  . Chest Pain      History of Present Illness: [name] is a 64 y.o. female who presents for evaluation of known coronary disease. She has a history of CABG in 1996. 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  She has not been seen in a while. She's been off her medications. She did recently have a lipid profile with an LDL slightly elevated and she was restarted on a statin.  She actually has done well. She has a physical active job. The patient denies any new symptoms such as chest discomfort, neck or arm discomfort. There has been no new shortness of breath, PND or orthopnea. There have been no reported palpitations, presyncope or syncope.  She still smokes.    Past Medical History  Diagnosis Date  . Allergy   . Myocardial infarction   . Hypertension   . Hyperlipidemia   . Stroke   . COPD (chronic obstructive pulmonary disease)   . Smoker   . Coronary artery disease 05/16/1995--Cath    05/20/1995--CABG x 1-----LIMA to LAD  . Nephrolithiasis     Left Nephrectomy     Past Surgical History  Procedure Laterality Date  . Coronary artery bypass graft    . Kidney surgery Left     64 yo  . Left nephrectomy Left 10/17/1983    Dr. Sural---StagHornCalculus, Left Kidney Nonfunctioning wiht Acute and Chronic Pyelonephritis  . Carotid artery - subclavian artery bypass graft Left 04/18/2007     Current Outpatient Prescriptions  Medication Sig Dispense Refill  . aspirin 81 MG tablet Take 81 mg by mouth daily.    . simvastatin (ZOCOR) 20 MG tablet Take 1 tablet (20 mg total) by mouth at bedtime. 30 tablet 1   No current facility-administered medications for this visit.    Allergies:   Review of patient's allergies indicates no known  allergies.    Social History:  The patient  reports that she has been smoking Cigarettes.  She has a 10 pack-year smoking history. She does not have any smokeless tobacco history on file. She reports that she does not use illicit drugs.   Family History:  The patient's family history includes COPD in her brother and father; Cancer in her sister and sister; Depression in her sister.    ROS:  Please see the history of present illness.   Otherwise, review of systems are positive for none.   All other systems are reviewed and negative.    PHYSICAL EXAM: VS:  BP 168/100 mmHg  Pulse 84  Ht  (1.676 m)  Wt 96 lb 8 oz (43.772 kg)  BMI 15.58 kg/m2 , BMI Body mass index is 15.58 kg/(m^2). GENERAL:  Well appearing HEENT:  Pupils equal round and reactive, fundi not visualized, oral mucosa unremarkable NECK:  No jugular venous distention, waveform within normal limits, carotid upstroke brisk and symmetric, no bruits, no thyromegaly LYMPHATICS:  No cervical, inguinal adenopathy LUNGS:  Clear to auscultation bilaterally BACK:  No CVA tenderness CHEST:  Unremarkable HEART:  PMI not displaced or sustained,S1 and S2 within normal limits, no S3, no S4, no clicks, no rubs, no murmurs ABD:  Flat, positive bowel sounds normal in frequency in pitch, no bruits,  no rebound, no guarding, no midline pulsatile mass, no hepatomegaly, no splenomegaly EXT:  2 plus pulses throughout, no edema, no cyanosis no clubbing SKIN:  No rashes no nodules NEURO:  Cranial nerves II through XII grossly intact, motor grossly intact throughout PSYCH:  Cognitively intact, oriented to person place and time    EKG:  EKG is ordered today. The ekg ordered today demonstrates sinus rhythm, rate 84, axis within normal limits, intervals within normal limits, left atrial enlargement, right atrial enlargement, nonspecific lateral T-wave flattening.   Recent Labs: 01/30/2015: ALT 6; BUN 16; Creat 0.79; Hemoglobin 15.2*; Platelets  272; Potassium 5.9*; Sodium 139; TSH 0.811    Lipid Panel    Component Value Date/Time   CHOL 201* 01/30/2015 1206   TRIG 53 01/30/2015 1206   HDL 71 01/30/2015 1206   CHOLHDL 2.8 01/30/2015 1206   VLDL 11 01/30/2015 1206   LDLCALC 119 01/30/2015 1206      Wt Readings from Last 3 Encounters:  03/01/15 96 lb 8 oz (43.772 kg)  02/13/15 98 lb (44.453 kg)  01/30/15 98 lb (44.453 kg)      Other studies Reviewed: Additional studies/ records that were reviewed today include: Old office records including an extensive [facility] paper chart.  Review of the above records demonstrates:  Please see elsewhere in the note.     ASSESSMENT AND PLAN:  CAD:  She has no ongoing symptoms but she needs aggressive risk reduction. Like to also screen her. I will bring the patient back for a POET (Plain Old Exercise Test). This will allow me to screen for obstructive coronary disease, risk stratify and very importantly provide a prescription for exercise.  TOBACCO:  We discussed the need to stop smoking at length. She will let me know if she wants a prescription.  DYSLIPIDEMIA:  Patient's LDL was elevated as above. She's going to get this checked again in November with a goal less than 70. She is now taking statin.  HTN:  This is a very unusual blood pressure for her. It was the same in both arms. She's not seen elevated for like this in previous readings have not been as high. She needs to give me a blood pressure diary in the next month so I can understand whether she needs further treatment or not. We talked to her about this.  ANXIETY:  She did describe this. I certainly see no contraindication to medical therapy for this but I will defer to [facility] BETH, PA-C.  Anxiolytics or antidepressives would be fine from a cardiovascular standpoint.   Current medicines are reviewed at length with the patient today.  The patient does not have concerns regarding medicines.  The following changes have been  made:  no change  Labs/ tests ordered today include:   Orders Placed This Encounter  Procedures  . Exercise Tolerance Test  . EKG 12-Lead     Disposition:   FU with one year.    Signed, Rollene Rotunda, MD  03/01/2015 11:40 AM    [facility]

## 2015-03-06 ENCOUNTER — Telehealth: Payer: Self-pay | Admitting: Physician Assistant

## 2015-03-06 NOTE — Telephone Encounter (Signed)
[facility] [name]  Patient calling for refill on her nitro glycerine refilled and also valium  If possible these are prescribed by her heart doc and he said we would have to fill these for her

## 2015-03-06 NOTE — Telephone Encounter (Signed)
Dr. Antoine Poche (Cardiologist) did send me a staff message stating that he had told pt that if I felt that she needed anxiolytic, that it was safe from Cardiac standpoint.  Please add Anxiety to Problem List. Send Rx for Xanax 0.25mg  1 po QD PRN # 30 + 0 Also, pt does have h/o CAD so can send Rx for Ntg to use PRN----nitroglycerin 0.4 mg sublingual --As Directed---# 25 +0.

## 2015-03-06 NOTE — Telephone Encounter (Signed)
I see no history of this patient on either medication.  No history on drug registry.  Please advise??

## 2015-03-07 MED ORDER — NITROGLYCERIN 0.4 MG SL SUBL: 0.4000 mg | SUBLINGUAL_TABLET | SUBLINGUAL | Status: DC | PRN

## 2015-03-07 MED ORDER — ALPRAZOLAM 0.25 MG PO TABS: 0.2500 mg | ORAL_TABLET | Freq: Every day | ORAL | Status: DC | PRN

## 2015-03-07 NOTE — Telephone Encounter (Signed)
Left pt message Rx's called in

## 2015-03-16 ENCOUNTER — Encounter: Payer: Self-pay | Admitting: *Deleted

## 2015-03-23 ENCOUNTER — Ambulatory Visit: Payer: BLUE CROSS/BLUE SHIELD

## 2015-03-23 ENCOUNTER — Inpatient Hospital Stay: Admission: RE | Admit: 2015-03-23 | Payer: BLUE CROSS/BLUE SHIELD | Source: Ambulatory Visit

## 2015-03-30 ENCOUNTER — Ambulatory Visit
Admission: RE | Admit: 2015-03-30 | Discharge: 2015-03-30 | Disposition: A | Discharge status: Home / Self Care | Payer: BLUE CROSS/BLUE SHIELD | Source: Ambulatory Visit | Attending: Physician Assistant | Admitting: Physician Assistant

## 2015-03-30 ENCOUNTER — Inpatient Hospital Stay (HOSPITAL_COMMUNITY): Admission: RE | Admit: 2015-03-30 | Payer: BLUE CROSS/BLUE SHIELD | Source: Ambulatory Visit

## 2015-03-30 DIAGNOSIS — F17200 Nicotine dependence, unspecified, uncomplicated: Secondary | ICD-10-CM

## 2015-03-30 DIAGNOSIS — Z0000 Encounter for general adult medical examination without abnormal findings: Secondary | ICD-10-CM

## 2015-03-30 DIAGNOSIS — M810 Age-related osteoporosis without current pathological fracture: Secondary | ICD-10-CM

## 2015-03-30 DIAGNOSIS — E559 Vitamin D deficiency, unspecified: Secondary | ICD-10-CM

## 2015-03-30 LAB — HM DEXA SCAN

## 2015-04-03 ENCOUNTER — Other Ambulatory Visit: Payer: Self-pay | Admitting: Family Medicine

## 2015-04-03 DIAGNOSIS — M810 Age-related osteoporosis without current pathological fracture: Secondary | ICD-10-CM

## 2015-04-03 MED ORDER — ALENDRONATE SODIUM 70 MG PO TABS: 70.0000 mg | ORAL_TABLET | ORAL | Status: DC

## 2015-04-07 ENCOUNTER — Other Ambulatory Visit: Payer: Self-pay | Admitting: Family Medicine

## 2015-04-07 DIAGNOSIS — E785 Hyperlipidemia, unspecified: Secondary | ICD-10-CM

## 2015-04-07 MED ORDER — SIMVASTATIN 20 MG PO TABS
20.0000 mg | ORAL_TABLET | Freq: Every day | ORAL | Status: DC
Start: 2015-04-07 — End: 2015-07-21

## 2015-04-07 NOTE — Telephone Encounter (Signed)
Medication refilled per protocol. 

## 2015-04-16 ENCOUNTER — Other Ambulatory Visit: Payer: Self-pay | Admitting: Physician Assistant

## 2015-04-17 NOTE — Telephone Encounter (Signed)
Medication refilled per protocol. 

## 2015-04-21 ENCOUNTER — Encounter: Payer: Self-pay | Admitting: Family Medicine

## 2015-04-23 ENCOUNTER — Other Ambulatory Visit: Payer: Self-pay | Admitting: Physician Assistant

## 2015-04-24 NOTE — Telephone Encounter (Signed)
LRF Alprazolam 03/07/15 #30  LOV 02/13/15  OK refill?

## 2015-04-24 NOTE — Telephone Encounter (Signed)
Medication refilled per protocol. 

## 2015-04-24 NOTE — Telephone Encounter (Signed)
Approved. #[id number]. 

## 2015-04-26 ENCOUNTER — Telehealth (HOSPITAL_COMMUNITY): Payer: Self-pay

## 2015-04-26 NOTE — Telephone Encounter (Signed)
Encounter complete. 

## 2015-04-28 ENCOUNTER — Ambulatory Visit (HOSPITAL_COMMUNITY)
Admission: RE | Admit: 2015-04-28 | Discharge: 2015-04-28 | Disposition: A | Discharge status: Home / Self Care | Payer: BLUE CROSS/BLUE SHIELD | Source: Ambulatory Visit | Attending: Cardiology | Admitting: Cardiology

## 2015-04-28 DIAGNOSIS — Z951 Presence of aortocoronary bypass graft: Secondary | ICD-10-CM | POA: Diagnosis not present

## 2015-04-28 DIAGNOSIS — I2510 Atherosclerotic heart disease of native coronary artery without angina pectoris: Secondary | ICD-10-CM | POA: Insufficient documentation

## 2015-04-28 LAB — EXERCISE TOLERANCE TEST
CHL CUP MPHR: 157 {beats}/min
CHL CUP RESTING HR STRESS: 76 {beats}/min
CHL RATE OF PERCEIVED EXERTION: 15
CSEPEDS: 13 s
CSEPHR: 97 %
Estimated workload: 7 METS
Exercise duration (min): 5 min
Peak HR: 153 {beats}/min

## 2015-05-17 ENCOUNTER — Ambulatory Visit: Payer: BLUE CROSS/BLUE SHIELD | Admitting: Physician Assistant

## 2015-05-19 ENCOUNTER — Ambulatory Visit: Payer: BLUE CROSS/BLUE SHIELD | Admitting: Cardiology

## 2015-05-29 ENCOUNTER — Ambulatory Visit (INDEPENDENT_AMBULATORY_CARE_PROVIDER_SITE_OTHER): Payer: BLUE CROSS/BLUE SHIELD | Admitting: Physician Assistant

## 2015-05-29 ENCOUNTER — Encounter: Payer: Self-pay | Admitting: Physician Assistant

## 2015-05-29 VITALS — BP 174/100 | HR 76 | Temp 98.2°F | Resp 18 | Wt 108.0 lb

## 2015-05-29 DIAGNOSIS — I10 Essential (primary) hypertension: Secondary | ICD-10-CM

## 2015-05-29 DIAGNOSIS — F17200 Nicotine dependence, unspecified, uncomplicated: Secondary | ICD-10-CM

## 2015-05-29 DIAGNOSIS — M810 Age-related osteoporosis without current pathological fracture: Secondary | ICD-10-CM

## 2015-05-29 DIAGNOSIS — Z905 Acquired absence of kidney: Secondary | ICD-10-CM | POA: Diagnosis not present

## 2015-05-29 DIAGNOSIS — I708 Atherosclerosis of other arteries: Secondary | ICD-10-CM | POA: Diagnosis not present

## 2015-05-29 DIAGNOSIS — E785 Hyperlipidemia, unspecified: Secondary | ICD-10-CM | POA: Diagnosis not present

## 2015-05-29 DIAGNOSIS — Z95828 Presence of other vascular implants and grafts: Secondary | ICD-10-CM | POA: Diagnosis not present

## 2015-05-29 DIAGNOSIS — Z951 Presence of aortocoronary bypass graft: Secondary | ICD-10-CM

## 2015-05-29 DIAGNOSIS — I771 Stricture of artery: Secondary | ICD-10-CM

## 2015-05-29 DIAGNOSIS — Z720 Tobacco use: Secondary | ICD-10-CM | POA: Diagnosis not present

## 2015-05-29 DIAGNOSIS — I2510 Atherosclerotic heart disease of native coronary artery without angina pectoris: Secondary | ICD-10-CM

## 2015-05-29 DIAGNOSIS — E559 Vitamin D deficiency, unspecified: Secondary | ICD-10-CM | POA: Diagnosis not present

## 2015-05-29 DIAGNOSIS — J439 Emphysema, unspecified: Secondary | ICD-10-CM | POA: Diagnosis not present

## 2015-05-29 DIAGNOSIS — Z87442 Personal history of urinary calculi: Secondary | ICD-10-CM | POA: Diagnosis not present

## 2015-05-29 MED ORDER — LISINOPRIL 10 MG PO TABS: 10.0000 mg | ORAL_TABLET | Freq: Every day | ORAL | Status: DC

## 2015-05-29 NOTE — Progress Notes (Signed)
Patient ID: Modest Draeger MRN: [id number], DOB: 10-29-1950, 64 y.o. Date of Encounter: @  Chief Complaint:  Chief Complaint  Patient presents with  . Routine Follow-up OV        HPI: 64 y.o. year old white female  Presents for f/u OV.   THE FOLLOWING IS COPIED FROM HER OV NOTE WITH ME 01/30/2015:    01/30/2015: I REVIEWED HER PAPER CHART AND ABSTRACTED ALL IMPORTANT INFORMATION INTO EPIC  She presents to re-establish care. Her last office visit here in this office was 07/06/2010. She reports that since then, she has had no follow-up and no medical care. She says that any time she is around her friends-- they seem to have all types of aches and pains and problems-- and that she felt that she was in much better shape than any of them so she felt that she really did not need to be coming in. Says that because of her bypass surgery and having a kidney removed in the past, that she has been eating a very healthy diet because of the instructions she was given at the time of those surgeries. Says that she has continued to follow the diet given back then. Says that she has a stationary bike and she actually uses hers!! Says that she mows her grass using a riding mower as well as a push mower. Says that she has continued all of these habits and is very active and has been feeling fine so she felt that she could just go without coming into a doctor's office.  However, she recently decided that she probably better come in and get checked just to make sure.  States that she has been having no angina type symptoms. States that she has been feeling well.   AT THAT INITIAL OV ON 01/30/2015: Checked Full Panel of Labs Ordered Referral to Cardiology Gave Tdap, Pneumovax 23 Discussed her returning for CPE and she was agreeable.  I reviewed her paper chart and abstracted information into Epic   02/13/2015---- She presented for CPE. Had no complaints or concerns to address.    05/29/2015: She states that she saw Cardiology for office visit. Says that they had her scheduled to perform an exercise test and her blood pressure went up during the test so she has an appointment to see him this Friday for follow-up. Reviewed her chart. She had a visit with Dr. Antoine Poche on 03/01/15. At that time the plan was for her to have an exercise test and follow-up with him one year. However, she does have an appointment with him this coming Friday.  Today she reports that she has decreased smoking and has decreased to about 1/2 pack a day. Says that she is "doing good on that ".  States that she is taking her simvastatin as directed. No myalgias or other adverse effects.  States that she is taking the vitamin D 2000 units daily as directed. States that she is taking the Fosamax once weekly as directed and is taking it every Sunday. She has no specific complaints or concerns today.    Past Medical History/Problem List:   1. Atherosclerosis of native coronary artery of native heart without angina pectoris  2. History of coronary artery bypass surgery 05/20/1995 she underwent coronary artery bypass grafting by Dr. Particia Lather with a LIMA to the LAD. Cardiac catheterization had revealed 95% proximal LAD lesion too close to the left main for endovascular interventional treatment.  CABG was indicated after a positive stress  test, for relief of symptoms, and for myocardial preservation.  3. Subclavian artery stenosis, left She had complained of some mild chest discomfort so underwent a Cardiolite scan 02/23/2007. This revealed evidence of mild anteroseptal ischemia with normal EF. She was also noted to have unequal upper extremity blood pressures on her initial visit with Dr. Jenne Campus with blood pressure 120/70 in the right arm and 90/60 in the left arm. This raised suspicion for left subclavian stenosis.  She underwent left subclavian ultrasound 03/05/2007. Revealed widely patent  right and left internal carotids. There were abnormal waveform in the left subclavian suggesting > 50% stenosis. It was felt that she had probable left subclavian stenosis which was concerning that may be compromising her LIMA to her LAD given her abnormal Cardiolite scan. She underwent cardiac catheterization 03/27/2007 revealing 50% proximal LAD lesion. The IMA was visualized with 70% lesion in the IMA prior to the insertion site. The ramus and OM had no significant disease. There was 60% lesion in the ostial portion of the RCA. This showed to have 99% ostial left subclavian stenosis with calcification and TIMI 1 flow into the left subclavian. She was scheduled to undergo left subclavian angiogram with possible PTA/stenting by Dr. Allyson Sabal on 04/13/2007. However, failed percutaneous revascularization.  Had left carotid -  subclavian bypass in 2008.  4. S/P vascular bypass ------ left carotid -  subclavian bypass in 2008.  5. Hyperlipidemia When she was seeing me in the past she was on Crestor 20 mg daily.  6. Smoker  7. Vitamin D deficiency  8. Osteoporosis DEXA scan 03/07/2010 showed: Lumbar spine----- T score -2.5 Left femur---------- T score -1.6 Patient's diagnostic category was osteoporosis by criteria. At that time, she was prescribed Fosamax 70 mg 1 every week. She was also to take 1200 mg calcium daily. August 2011 vitamin D level had been low and she was given prescription strength vitamin D and was to take this.    Home Meds: None  Allergies: No Known Allergies  Social History   Social History  . Marital Status: Married    Spouse Name: N/A  . Number of Children: 2  . Years of Education: N/A   Occupational History  . Not on file.   Social History Main Topics  . Smoking status: Current Every Day Smoker -- 0.25 packs/day for 40 years    Types: Cigarettes  . Smokeless tobacco: Not on file  . Alcohol Use: Not on file  . Drug Use: No  . Sexual Activity: Yes   Other  Topics Concern  . Not on file   Social History Narrative   Lives alone.      Family History  Problem Relation Age of Onset  . Depression Sister   . COPD Father   . Cancer Sister     hysterectomy sec to cancer  . Cancer Sister     cervical cancer  . COPD Brother      Review of Systems:  See HPI for pertinent ROS. All other ROS negative.    Physical Exam: Blood pressure 174/100, pulse 76, temperature 98.2 F (36.8 C), temperature source Oral, resp. rate 18, weight 108 lb (48.988 kg)., Body mass index is 17.44 kg/(m^2). General: Thin, WNWD WF. Appears in no acute distress. Neck: Supple. No thyromegaly. No lymphadenopathy. She has soft carotid bruits bilaterally. She has a soft left subclavian bruit as well. Lungs: Clear bilaterally to auscultation without wheezes, rales, or rhonchi. Breathing is unlabored. Heart: RRR with S1 S2. No murmurs,  rubs, or gallops. Abdomen: Soft, non-tender, non-distended with normoactive bowel sounds. No hepatomegaly. No rebound/guarding. No obvious abdominal masses. Musculoskeletal:  Strength and tone normal for age. Extremities/Skin: Warm and dry. No edema. Neuro: Alert and oriented X 3. Moves all extremities spontaneously. Gait is normal. CNII-XII grossly in tact. Psych:  Responds to questions appropriately with a normal affect.     ASSESSMENT AND PLAN:  64 y.o. year old female with   Atherosclerosis of native coronary artery of native heart without angina pectoris At visit 01/2015 I ordered referral back to Cardiology. Cardiology is now managing this.   History of coronary artery bypass surgery -At visit 01/2015 I ordered referral back to Cardiology. Cardiology is now managing this.   Subclavian artery stenosis, left At visit 01/2015 I ordered referral back to Cardiology. Cardiology is now managing this.  S/P vascular bypass At visit 01/2015 I ordered referral back to Cardiology. Cardiology is now managing this.  Essential hypertension At  visit 05/29/15 blood pressure is elevated. Will add lisinopril 10 mg daily. Will have her schedule follow-up office visit here in 3 weeks to follow-up this--to recheck BP and BMET on medication. Also, she reports that when she went for the stress test recently, her blood pressure increased with exercise and that she was scheduled follow-up appointment with Dr. Antoine Poche this Friday. She is to keep that appointment as well, but also to follow-up with me in 3 weeks. - lisinopril (PRINIVIL,ZESTRIL) 10 MG tablet; Take 1 tablet (10 mg total) by mouth daily.  Dispense: 30 tablet; Refill: 0 - COMPLETE METABOLIC PANEL WITH GFR    Hyperlipidemia Lab 01/30/15 LDL was 119. At that time started simvastatin 20 mg. She has not had follow-up labs but states that she is fasting today and that she is taking the simvastatin 20 mg daily. Recheck FLP/LFT now 05/29/15  Smoker At OV 02/13/2015--Discussed Medication for Cessation She says she wants to work on "cutting back on her own first"----"if that doesn't work, then would try medication to help with this" At OV 05/29/15 she says she has decreased to 1/2 ppd and says she's "doing good on that".  Vitamin D deficiency At OV 01/30/2015--Checked Vitamin D Level---Was low at 21---At f/u OV she confirms that she IS taking the otc Vitamin D 2,000 IU as directed  Osteoporosis At OV 01/30/2015--Checked Vitamin D Level---Was low at 21---At f/u OV she confirms that she IS taking the otc Vitamin D 2,000 IU as directed At CPE 02/13/15-- Ordered  Bone Density She is a smoker. --Review of Paper Chart: DEXA scan 03/07/2010 showed: Lumbar spine----- T score -2.5 Left femur---------- T score -1.6 Patient's diagnostic category was osteoporosis by criteria. At that time, she was prescribed Fosamax 70 mg 1 every week. She was also to take 1200 mg calcium daily. August 2011 vitamin D level had been low and she was given prescription strength vitamin D and was to take this. I reviewed  her old paper chart. When she had DEXA 03/07/2010 this is when Fosamax was prescribed. However she then stopped follow-up visits/meds. At most, she took Fosamax for 12 months, beginning 02/2010. Her most recent DEXA scan was performed 03/30/15. T-scores were -1.7 and -2.3. At that time she was told to start Fosamax 70 mg once a week. She has already started on vitamin D supplement and calcium.   Solitary kidney, acquired At visit 05/29/15 reviewed the need for follow-up with urology. She is now agreeable for me to order referral. Noted that she has seen Dr.  Ron Sural in the past and that if he is still available, to schedule appointment with him or someone in his practice. - Ambulatory referral to Urology   History of nephrolithiasis H/O Left Nephrectomy 10/17/1983. Dr. Etta GrandchildSural. Staghorn Calculus, Left Kidney Nonfunctioning, With Acute and Chronic Pyelonephritis At visit 05/29/15 reviewed the need for follow-up with urology. She is now agreeable for me to order referral. Noted that she has seen Dr. Mickel Crowon Sural in the past and that if he is still available, to schedule appointment with him or someone in his practice. - Ambulatory referral to Urology     THE FOLLOWING IS COPIED FROM HER CPE PERFORMED 03/30/2015: 1. Visit for preventive health examination  A. Screening Labs: 01/30/2015--Did Labs: CBC--------Normal CMET-----Normal FLP--------LDL--119--started Simvastatin 20mg ---At OV 02/13/15-she says she IS taking this TSH-------Normal Vit D------21--Recommended to start otc Vit D 2,000 IU QD---At OV 02/13/15- she says she IS taking this  B. Pap Smear: Pap Smear sent 02/13/2015 - PAP, Thin Prep w/HPV rflx HPV Type 16/18  C. Screening Mammogram: At CPE 02/13/2015---she is agreeable for me to schedule f/u Mammogram - MM Digital Screening; Future SHe had mammogram 03/31/15 ---negative  D. DEXA: At CPE 02/13/2015--she is agreeable for me to schedule f/u DEXA - DG Bone Density; Future See Note under  "Osteoporosis" Above  E. Screening for Colorectal Cancer: Pt reports she has had one colonoscopy in past---was performed at [facility] I CHECKED EPIC---SEE NO MENTION OF THIS IN ENCOUNTERS, PROCEDURES, ETC I CHECKED PAPER CHART---CANNOT FIND COPY OF THIS AT NEXT OV--WILL HAVE PT SIGN RELEASE TO GET RECORD/FIND OUT DATE OF LAST COLONOSCOPY --AT OV 05/29/15--- I did have her sign a release form to get copy of last colonoscopy from [facility]  F. Immunizations:  Influenza Vaccine--------------N/A---August------Given here 05/29/15 T dap-----------------------------Given here 01/30/2015 Pneumovax 23 -----------------Given here 01/30/2015. No further Pneumonia Vaccine indicated until age 89 Zostavax:   I Wrote this on her AVS 02/13/2015--to remind her--to call insurance regarding coverage, then call us and let us know.    WILL PLAN F/U OV 3 WEEKS, SOONER IF NEEDED.  [id number] Sy[address]igned, [place] Gear Beth AbbevilleDixon, GeorgiaPA, [facility] LLPBSFM 05/29/2015 2:12 PM

## 2015-05-30 LAB — COMPLETE METABOLIC PANEL WITH GFR
ALBUMIN: 4 g/dL (ref 3.6–5.1)
ALK PHOS: 45 U/L (ref 33–130)
ALT: 6 U/L (ref 6–29)
AST: 18 U/L (ref 10–35)
BUN: 13 mg/dL (ref 7–25)
CALCIUM: 10 mg/dL (ref 8.6–10.4)
CHLORIDE: 105 mmol/L (ref 98–110)
CO2: 27 mmol/L (ref 20–31)
Creat: 0.7 mg/dL (ref 0.50–0.99)
GFR, Est African American: >89 mL/min (ref >=60)
Glucose, Bld: 67 mg/dL — ABNORMAL LOW (ref 70–99)
POTASSIUM: 5.1 mmol/L (ref 3.5–5.3)
SODIUM: 139 mmol/L (ref 135–146)
Total Bilirubin: 0.4 mg/dL (ref 0.2–1.2)
Total Protein: 6.3 g/dL (ref 6.1–8.1)

## 2015-05-30 LAB — LIPID PANEL
CHOL/HDL RATIO: 3 ratio (ref <=5.0)
CHOLESTEROL: 165 mg/dL (ref 125–200)
HDL: 55 mg/dL (ref >=46)
LDL Cholesterol: 88 mg/dL (ref <130)
Triglycerides: 110 mg/dL (ref <150)
VLDL: 22 mg/dL (ref <30)

## 2015-06-01 ENCOUNTER — Encounter: Payer: Self-pay | Admitting: Cardiology

## 2015-06-01 ENCOUNTER — Ambulatory Visit (INDEPENDENT_AMBULATORY_CARE_PROVIDER_SITE_OTHER): Payer: BLUE CROSS/BLUE SHIELD | Admitting: Cardiology

## 2015-06-01 VITALS — BP 164/86 | HR 78 | Ht 67.0 in | Wt 103.8 lb

## 2015-06-01 DIAGNOSIS — I10 Essential (primary) hypertension: Secondary | ICD-10-CM | POA: Diagnosis not present

## 2015-06-01 NOTE — Progress Notes (Signed)
Cardiology Office Note   Date:  06/01/2015   ID:  [name], DOB 23-Apr-1951, MRN [id number]  PCP:  Frazier Richards, PA-C  Cardiologist:   Rollene Rotunda, MD   Chief Complaint  Patient presents with  . Follow-up    pt states no chest pain no light headedness or dizziness no edema   . Shortness of Breath    some SOB      History of Present Illness: [name] is a 64 y.o. female who presents for evaluation of known coronary disease. She has a history of CABG in 1996. 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   I sent her for a POET (Plain Old Exercise Treadmill)  Recently. This was negative for any evidence of ischemia. However, she had a hypertensive blood pressure response. She's back to discuss this.   Yesterday she did see her primary care doctor who prescribed lisinopril. She just started taking it. She's had no new cardiovascular complaints. She's had no new shortness of breath, PND or orthopnea.  Past Medical History  Diagnosis Date  . Allergy   . Myocardial infarction (HCC)   . Hypertension   . Hyperlipidemia   . Stroke (HCC)   . COPD (chronic obstructive pulmonary disease) (HCC)   . Smoker   . Coronary artery disease 05/16/1995--Cath    05/20/1995--CABG x 1-----LIMA to LAD  . Nephrolithiasis     Left Nephrectomy     Past Surgical History  Procedure Laterality Date  . Coronary artery bypass graft    . Kidney surgery Left     64 yo  . Left nephrectomy Left 10/17/1983    Dr. Sural---StagHornCalculus, Left Kidney Nonfunctioning wiht Acute and Chronic Pyelonephritis  . Carotid artery - subclavian artery bypass graft Left 04/18/2007     Current Outpatient Prescriptions  Medication Sig Dispense Refill  . alendronate (FOSAMAX) 70 MG tablet Take 1 tablet (70 mg total) by mouth every 7 (seven) days. Take with a full glass of water on an empty stomach. 12 tablet 3  . ALPRAZolam (XANAX) 0.25 MG tablet TAKE 1 TABLET BY MOUTH  EVERY DAY AS NEEDED FOR ANXIETY 30 tablet 2  . aspirin 81 MG tablet Take 81 mg by mouth daily.    Marland Kitchen lisinopril (PRINIVIL,ZESTRIL) 10 MG tablet Take 1 tablet (10 mg total) by mouth daily. 30 tablet 0  . nitroGLYCERIN (NITROSTAT) 0.4 MG SL tablet PLACE 1 TABLET ON THE TONGUE &LET DISSOLVE EVERY 5 MINS AS NEEDED FOR CHEST PAIN 25 tablet 0  . simvastatin (ZOCOR) 20 MG tablet Take 1 tablet (20 mg total) by mouth at bedtime. 90 tablet 0   No current facility-administered medications for this visit.    Allergies:   Review of patient's allergies indicates no known allergies.    ROS:  Please see the history of present illness.   Otherwise, review of systems are positive for none.   All other systems are reviewed and negative.    PHYSICAL EXAM: VS:  BP 164/86 mmHg  Pulse 78  Ht  (1.702 m)  Wt 103 lb 12.8 oz (47.083 kg)  BMI 16.25 kg/m2 , BMI Body mass index is 16.25 kg/(m^2). GENERAL:  Well appearing HEENT:  Pupils equal round and reactive, fundi not visualized, oral mucosa unremarkable, conjunctival hemorrhage.   NECK:  No jugular venous distention, waveform within normal limits, carotid upstroke brisk and symmetric, no bruits, no thyromegaly LUNGS:  Clear to auscultation bilaterally BACK:  No CVA tenderness CHEST:  Unremarkable HEART:  PMI not displaced or sustained,S1 and S2 within normal limits, no S3, no S4, no clicks, no rubs, no murmurs ABD:  Flat, positive bowel sounds normal in frequency in pitch, no bruits, no rebound, no guarding, no midline pulsatile mass, no hepatomegaly, no splenomegaly EXT:  2 plus pulses throughout, no edema, no cyanosis no clubbing   EKG:  EKG is not ordered today.    Recent Labs: 01/30/2015: Hemoglobin 15.2*; Platelets 272; TSH 0.811 05/29/2015: ALT 6; BUN 13; Creat 0.70; Potassium 5.1; Sodium 139    Lipid Panel    Component Value Date/Time   CHOL 165 05/29/2015 1433   TRIG 110 05/29/2015 1433   HDL 55 05/29/2015 1433   CHOLHDL 3.0  05/29/2015 1433   VLDL 22 05/29/2015 1433   LDLCALC 88 05/29/2015 1433      Wt Readings from Last 3 Encounters:  06/01/15 103 lb 12.8 oz (47.083 kg)  05/29/15 108 lb (48.988 kg)  03/01/15 96 lb 8 oz (43.772 kg)      Other studies Reviewed: Additional studies/ records that were reviewed today include:  POET (Plain Old Exercise Treadmill)     ASSESSMENT AND PLAN:  CAD:   The patient had negative test for ischemia. She will continue with risk reduction.  TOBACCO:   We talked about this again today. She is tapering I told her to please pick a quit date.   DYSLIPIDEMIA:   Her cholesterol is excellent she will continue the meds as listed.  Lab Results  Component Value Date   CHOL 165 05/29/2015   TRIG 110 05/29/2015   HDL 55 05/29/2015   LDLCALC 88 05/29/2015    HTN:   Her blood pressure is elevated as above. She will continue the lisinopril. I talked to her about buying a blood pressure cuff getting a blood pressure diary. After she's had a couple of weeks of data she can let us know and I will titrate meds as necessary.   Current medicines are reviewed at length with the patient today.  The patient does not have concerns regarding medicines.  The following changes have been made:  no change  Labs/ tests ordered today include:   No orders of the defined types were placed in this encounter.     Disposition:   FU with 3 months.[name].    Signed, Lennon Boutwell, MD  06/01/2015 3:47 PM    [facility]

## 2015-06-01 NOTE — Patient Instructions (Signed)
Your physician wants you to follow-up in: 4 Months. You will receive a reminder letter in the mail two months in advance. If you don't receive a letter, please call our office to schedule the follow-up appointment.   Merry Christmas and Happy New Year!!  

## 2015-06-21 ENCOUNTER — Ambulatory Visit: Payer: BLUE CROSS/BLUE SHIELD | Admitting: Physician Assistant

## 2015-07-05 ENCOUNTER — Other Ambulatory Visit: Payer: Self-pay | Admitting: Physician Assistant

## 2015-07-05 NOTE — Telephone Encounter (Signed)
Refill appropriate and filled per protocol. 

## 2015-07-21 ENCOUNTER — Other Ambulatory Visit: Payer: Self-pay | Admitting: Physician Assistant

## 2015-07-21 NOTE — Telephone Encounter (Signed)
Refill appropriate and filled per protocol. 

## 2015-07-21 NOTE — Telephone Encounter (Signed)
Medication refilled per protocol. 

## 2015-07-27 ENCOUNTER — Other Ambulatory Visit: Payer: Self-pay | Admitting: Family Medicine

## 2015-07-27 MED ORDER — LISINOPRIL 10 MG PO TABS: ORAL_TABLET | ORAL | Status: DC

## 2015-07-27 NOTE — Telephone Encounter (Signed)
Medication refilled per protocol. 

## 2015-10-16 ENCOUNTER — Ambulatory Visit: Payer: BLUE CROSS/BLUE SHIELD | Admitting: Physician Assistant

## 2015-10-19 NOTE — Progress Notes (Signed)
Cardiology Office Note   Date:  10/20/2015   ID:  [name], DOB Feb 10, 1951, MRN [id number]  PCP:  Frazier Richards, PA-C  Cardiologist:   Rollene Rotunda, MD   Chief Complaint  Patient presents with  . Hypertension      History of Present Illness: [name] is a 65 y.o. female who presents for evaluation of known coronary disease. She has a history of CABG in 1996. 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   I sent her for a POET (Plain Old Exercise Treadmill) recently. This was negative for any evidence of ischemia. However, she had a hypertensive blood pressure response.  She was supposed to go home and keep a BP diary after the last appt.  However, she did not do this.  She comes back today.  The patient denies any new symptoms such as chest discomfort, neck or arm discomfort. There has been no new shortness of breath, PND or orthopnea. There have been no reported palpitations, presyncope or syncope.    Past Medical History  Diagnosis Date  . Allergy   . Myocardial infarction (HCC)   . Hypertension   . Hyperlipidemia   . Stroke (HCC)   . COPD (chronic obstructive pulmonary disease) (HCC)   . Smoker   . Coronary artery disease 05/16/1995--Cath    05/20/1995--CABG x 1-----LIMA to LAD  . Nephrolithiasis     Left Nephrectomy     Past Surgical History  Procedure Laterality Date  . Coronary artery bypass graft    . Kidney surgery Left     65 yo  . Left nephrectomy Left 10/17/1983    Dr. Sural---StagHornCalculus, Left Kidney Nonfunctioning wiht Acute and Chronic Pyelonephritis  . Carotid artery - subclavian artery bypass graft Left 04/18/2007     Current Outpatient Prescriptions  Medication Sig Dispense Refill  . alendronate (FOSAMAX) 70 MG tablet Take 1 tablet (70 mg total) by mouth every 7 (seven) days. Take with a full glass of water on an empty stomach. 12 tablet 3  . ALPRAZolam (XANAX) 0.25 MG tablet TAKE 1 TABLET BY MOUTH  EVERY DAY AS NEEDED FOR ANXIETY 30 tablet 2  . aspirin 81 MG tablet Take 81 mg by mouth daily.    Marland Kitchen lisinopril (PRINIVIL,ZESTRIL) 20 MG tablet TAKE 1 TABLET (10 MG TOTAL) BY MOUTH DAILY. 90 tablet 3  . nitroGLYCERIN (NITROSTAT) 0.4 MG SL tablet PLACE 1 TABLET ON THE TONGUE &LET DISSOLVE EVERY 5 MINS AS NEEDED FOR CHEST PAIN 25 tablet 0  . simvastatin (ZOCOR) 20 MG tablet TAKE 1 TABLET EVERYDAY AT BEDTIME 90 tablet 0   No current facility-administered medications for this visit.    Allergies:   Review of patient's allergies indicates no known allergies.    ROS:  Please see the history of present illness.   Otherwise, review of systems are positive for none.   All other systems are reviewed and negative.    PHYSICAL EXAM: VS:  BP 154/78 mmHg  Pulse 77  Ht  (1.6 m)  Wt 98 lb (44.453 kg)  BMI 17.36 kg/m2 , BMI Body mass index is 17.36 kg/(m^2). GENERAL:  Well appearing HEENT:  Pupils equal round and reactive, fundi not visualized, oral mucosa unremarkable, conjunctival hemorrhage.   NECK:  No jugular venous distention, waveform within normal limits, carotid upstroke brisk and symmetric, no bruits, no thyromegaly LUNGS:  Clear to auscultation bilaterally BACK:  No CVA tenderness CHEST:  Unremarkable HEART:  PMI not displaced or sustained,S1 and S2 within normal limits, no S3, no S4, no clicks, no rubs, no murmurs ABD:  Flat, positive bowel sounds normal in frequency in pitch, no bruits, no rebound, no guarding, no midline pulsatile mass, no hepatomegaly, no splenomegaly EXT:  2 plus pulses throughout, no edema, no cyanosis no clubbing   EKG:  EKG is not ordered today.    Recent Labs: 01/30/2015: Hemoglobin 15.2*; Platelets 272; TSH 0.811 05/29/2015: ALT 6; BUN 13; Creat 0.70; Potassium 5.1; Sodium 139    Lipid Panel    Component Value Date/Time   CHOL 165 05/29/2015 1433   TRIG 110 05/29/2015 1433   HDL 55 05/29/2015 1433   CHOLHDL 3.0 05/29/2015 1433   VLDL 22  05/29/2015 1433   LDLCALC 88 05/29/2015 1433      Wt Readings from Last 3 Encounters:  10/20/15 98 lb (44.453 kg)  06/01/15 103 lb 12.8 oz (47.083 kg)  05/29/15 108 lb (48.988 kg)      Other studies Reviewed: Additional studies/ records that were reviewed today include:  POET (Plain Old Exercise Treadmill)     ASSESSMENT AND PLAN:  CAD:   The patient had negative test for ischemia. She will continue with risk reduction.    TOBACCO:   We talked about this again today. She has tapered and thinks that she can quit.  DYSLIPIDEMIA:   Her cholesterol is excellent she will continue the meds as listed.  Lab Results  Component Value Date   CHOL 165 05/29/2015   TRIG 110 05/29/2015   HDL 55 05/29/2015   LDLCALC 88 05/29/2015    HTN:   I will increase the lisinopril to 20 mg daily.  She is given written instructions to get a BMET in about 10 days.     Current medicines are reviewed at length with the patient today.  The patient does not have concerns regarding medicines.  The following changes have been made:  As above  Labs/ tests ordered today include:   Orders Placed This Encounter  Procedures  . Basic Metabolic Panel (BMET)     Disposition:   FU with APP in 4 months to follow BP.   Signed, Rollene RotundaJames Porshe Fleagle, MD  10/20/2015 12:11 PM    [facility]

## 2015-10-20 ENCOUNTER — Ambulatory Visit (INDEPENDENT_AMBULATORY_CARE_PROVIDER_SITE_OTHER): Payer: BLUE CROSS/BLUE SHIELD | Admitting: Cardiology

## 2015-10-20 ENCOUNTER — Encounter: Payer: Self-pay | Admitting: Cardiology

## 2015-10-20 VITALS — BP 154/78 | HR 77 | Ht 63.0 in | Wt 98.0 lb

## 2015-10-20 DIAGNOSIS — Z79899 Other long term (current) drug therapy: Secondary | ICD-10-CM | POA: Diagnosis not present

## 2015-10-20 MED ORDER — LISINOPRIL 20 MG PO TABS: ORAL_TABLET | ORAL | Status: DC

## 2015-10-20 NOTE — Patient Instructions (Signed)
Medication Instructions:  Increase Lisinopril 20 mg daily  Labwork: BMP in 10 days  Testing/Procedures: NONE  Follow-Up: Your physician wants you to follow-up in: 4 Months with APP You will receive a reminder letter in the mail two months in advance. If you don't receive a letter, please call our office to schedule the follow-up appointment.   Any Other Special Instructions Will Be Listed Below (If Applicable).   If you need a refill on your cardiac medications before your next appointment, please call your pharmacy.

## 2015-10-23 ENCOUNTER — Ambulatory Visit (INDEPENDENT_AMBULATORY_CARE_PROVIDER_SITE_OTHER): Payer: BLUE CROSS/BLUE SHIELD | Admitting: Physician Assistant

## 2015-10-23 ENCOUNTER — Encounter: Payer: Self-pay | Admitting: Physician Assistant

## 2015-10-23 VITALS — BP 152/94 | HR 84 | Temp 98.2°F | Resp 18 | Wt 97.0 lb

## 2015-10-23 DIAGNOSIS — I708 Atherosclerosis of other arteries: Secondary | ICD-10-CM

## 2015-10-23 DIAGNOSIS — Z951 Presence of aortocoronary bypass graft: Secondary | ICD-10-CM

## 2015-10-23 DIAGNOSIS — Z87442 Personal history of urinary calculi: Secondary | ICD-10-CM

## 2015-10-23 DIAGNOSIS — E559 Vitamin D deficiency, unspecified: Secondary | ICD-10-CM

## 2015-10-23 DIAGNOSIS — M810 Age-related osteoporosis without current pathological fracture: Secondary | ICD-10-CM

## 2015-10-23 DIAGNOSIS — E785 Hyperlipidemia, unspecified: Secondary | ICD-10-CM | POA: Diagnosis not present

## 2015-10-23 DIAGNOSIS — I10 Essential (primary) hypertension: Secondary | ICD-10-CM | POA: Diagnosis not present

## 2015-10-23 DIAGNOSIS — Z905 Acquired absence of kidney: Secondary | ICD-10-CM

## 2015-10-23 DIAGNOSIS — J439 Emphysema, unspecified: Secondary | ICD-10-CM | POA: Diagnosis not present

## 2015-10-23 DIAGNOSIS — Z720 Tobacco use: Secondary | ICD-10-CM

## 2015-10-23 DIAGNOSIS — I2510 Atherosclerotic heart disease of native coronary artery without angina pectoris: Secondary | ICD-10-CM

## 2015-10-23 DIAGNOSIS — Z95828 Presence of other vascular implants and grafts: Secondary | ICD-10-CM | POA: Diagnosis not present

## 2015-10-23 DIAGNOSIS — I771 Stricture of artery: Secondary | ICD-10-CM

## 2015-10-23 DIAGNOSIS — F17200 Nicotine dependence, unspecified, uncomplicated: Secondary | ICD-10-CM

## 2015-10-23 NOTE — Progress Notes (Signed)
Patient ID: [name] MRN: [id number], DOB: [birth year], 65 y.o. Date of Encounter: @DATE @  Chief Complaint:  Chief Complaint  Patient presents with  . Routine Follow-up OV        HPI: 65 y.o. year old white female  Presents for f/u OV.   THE FOLLOWING IS COPIED FROM HER OV NOTE WITH ME 01/30/2015:    64/15/2016: I REVIEWED HER PAPER CHART AND ABSTRACTED ALL IMPORTANT INFORMATION INTO EPIC  She presents to re-establish care. Her last office visit here in this office was 07/06/2010. She reports that since then, she has had no follow-up and no medical care. She says that any time she is around her friends-- they seem to have all types of aches and pains and problems-- and that she felt that she was in much better shape than any of them so she felt that she really did not need to be coming in. Says that because of her bypass surgery and having a kidney removed in the past, that she has been eating a very healthy diet because of the instructions she was given at the time of those surgeries. Says that she has continued to follow the diet given back then. Says that she has a stationary bike and she actually uses hers!! Says that she mows her grass using a riding mower as well as a push mower. Says that she has continued all of these habits and is very active and has been feeling fine so she felt that she could just go without coming into a doctor's office.  However, she recently decided that she probably better come in and get checked just to make sure.  States that she has been having no angina type symptoms. States that she has been feeling well.   AT THAT INITIAL OV ON 01/30/2015: Checked Full Panel of Labs Ordered Referral to Cardiology Gave Tdap, Pneumovax 23 Discussed her returning for CPE and she was agreeable.  I reviewed her paper chart and abstracted information into Epic   02/13/2015---- She presented for CPE. Had no complaints or concerns to address.    05/29/2015: She states that she saw Cardiology for office visit. Says that they had her scheduled to perform an exercise test and her blood pressure went up during the test so she has an appointment to see him this Friday for follow-up. Reviewed her chart. She had a visit with Dr. Antoine Poche on 03/01/15. At that time the plan was for her to have an exercise test and follow-up with him one year. However, she does have an appointment with him this coming Friday.  Today she reports that she has decreased smoking and has decreased to about 1/2 pack a day. Says that she is "doing good on that ".  States that she is taking her simvastatin as directed. No myalgias or other adverse effects.  States that she is taking the vitamin D 2000 units daily as directed. States that she is taking the Fosamax once weekly as directed and is taking it every Sunday. She has no specific complaints or concerns today.  10/23/2015: Reviewed her OV note with Dr. Antoine Poche 10/20/2015--he increased lisinopril to 20mg . Told her to check BMET 10 days. She has lab slip with her today. Says he told her she could have lab drawn here but told her it needs to be done 10days after increasing lisinopril.  She says she has decreased smoking to 10 cigarettes per day.  Says she missed Urology appt. Says she still has  their paperwork at home and she will call to schedule a new appt.  Is taking statin. No myalgias or other adv effects.  She is taking Fosamax. No jaw pain or thigh pain. Taking Vit D.  No complaints or concerns today.     Past Medical History/Problem List:   1. Atherosclerosis of native coronary artery of native heart without angina pectoris  2. History of coronary artery bypass surgery 05/20/1995 she underwent coronary artery bypass grafting by Dr. Particia Latherharles Wilson with a LIMA to the LAD. Cardiac catheterization had revealed 95% proximal LAD lesion too close to the left main for endovascular interventional treatment.   CABG was indicated after a positive stress test, for relief of symptoms, and for myocardial preservation.  3. Subclavian artery stenosis, left She had complained of some mild chest discomfort so underwent a Cardiolite scan 02/23/2007. This revealed evidence of mild anteroseptal ischemia with normal EF. She was also noted to have unequal upper extremity blood pressures on her initial visit with Dr. Jenne CampusMcQueen with blood pressure 120/70 in the right arm and 90/60 in the left arm. This raised suspicion for left subclavian stenosis.  She underwent left subclavian ultrasound 03/05/2007. Revealed widely patent right and left internal carotids. There were abnormal waveform in the left subclavian suggesting > 50% stenosis. It was felt that she had probable left subclavian stenosis which was concerning that may be compromising her LIMA to her LAD given her abnormal Cardiolite scan. She underwent cardiac catheterization 03/27/2007 revealing 50% proximal LAD lesion. The IMA was visualized with 70% lesion in the IMA prior to the insertion site. The ramus and OM had no significant disease. There was 60% lesion in the ostial portion of the RCA. This showed to have 99% ostial left subclavian stenosis with calcification and TIMI 1 flow into the left subclavian. She was scheduled to undergo left subclavian angiogram with possible PTA/stenting by Dr. Allyson SabalBerry on 04/13/2007. However, failed percutaneous revascularization.  Had left carotid -  subclavian bypass in 2008.  4. S/P vascular bypass ------ left carotid -  subclavian bypass in 2008.  5. Hyperlipidemia When she was seeing me in the past she was on Crestor 20 mg daily.  6. Smoker  7. Vitamin D deficiency  8. Osteoporosis DEXA scan 03/07/2010 showed: Lumbar spine----- T score -2.5 Left femur---------- T score -1.6 Patient's diagnostic category was osteoporosis by criteria. At that time, she was prescribed Fosamax 70 mg 1 every week. She was also to take 1200  mg calcium daily. August 2011 vitamin D level had been low and she was given prescription strength vitamin D and was to take this.    Home Meds: None  Allergies: No Known Allergies  Social History   Social History  . Marital Status: Married    Spouse Name: N/A  . Number of Children: 2  . Years of Education: N/A   Occupational History  . Not on file.   Social History Main Topics  . Smoking status: Current Every Day Smoker -- 0.25 packs/day for 40 years    Types: Cigarettes  . Smokeless tobacco: Not on file  . Alcohol Use: Not on file  . Drug Use: No  . Sexual Activity: Yes   Other Topics Concern  . Not on file   Social History Narrative   Lives alone.      Family History  Problem Relation Age of Onset  . Depression Sister   . COPD Father   . Cancer Sister     hysterectomy sec  to cancer  . Cancer Sister     cervical cancer  . COPD Brother      Review of Systems:  See HPI for pertinent ROS. All other ROS negative.    Physical Exam: Blood pressure 152/94, pulse 84, temperature 98.2 F (36.8 C), temperature source Oral, resp. rate 18, weight 97 lb (43.999 kg)., Body mass index is 17.19 kg/(m^2). General: Thin, WNWD WF. Appears in no acute distress. Neck: Supple. No thyromegaly. No lymphadenopathy. She has soft carotid bruits bilaterally. She has a soft left subclavian bruit as well. Lungs: Clear bilaterally to auscultation without wheezes, rales, or rhonchi. Breathing is unlabored. Heart: RRR with S1 S2. No murmurs, rubs, or gallops. Abdomen: Soft, non-tender, non-distended with normoactive bowel sounds. No hepatomegaly. No rebound/guarding. No obvious abdominal masses. Musculoskeletal:  Strength and tone normal for age. Extremities/Skin: Warm and dry. No edema. Neuro: Alert and oriented X 3. Moves all extremities spontaneously. Gait is normal. CNII-XII grossly in tact. Psych:  Responds to questions appropriately with a normal affect.     ASSESSMENT AND  PLAN:  65 y.o. year old female with   Atherosclerosis of native coronary artery of native heart without angina pectoris At visit 01/2015 I ordered referral back to Cardiology. Cardiology is now managing this.   History of coronary artery bypass surgery -At visit 01/2015 I ordered referral back to Cardiology. Cardiology is now managing this.   Subclavian artery stenosis, left At visit 01/2015 I ordered referral back to Cardiology. Cardiology is now managing this.  S/P vascular bypass At visit 01/2015 I ordered referral back to Cardiology. Cardiology is now managing this.  Essential hypertension At visit 05/29/15 blood pressure was elevated. Added lisinopril 10 mg daily. Planned for her to schedule follow-up office visit here in 3 weeks to follow-up this--to recheck BP and BMET on medication. However, did not f/u.  She had OV at Cardiology 10/20/15---Dr. Antoine Poche increased lisinopril  QD. To recheck BMET after on increased med 10 days.  She will return fasting for labs around May 15th.    Hyperlipidemia Lab 01/30/15 LDL was 119. At that time started simvastatin 20 mg. She has not had follow-up labs but states that she is fasting today and that she is taking the simvastatin 20 mg daily. She had f/u LFP/LFT 05/29/2015.  At OV 10/23/2015---placed future order for FLP/CMET---pt to return fasting to recheck labs around 10/30/2015---after increased lisinopril dose for 10 days per Cardiology---  Smoker At OV 02/13/2015--Discussed Medication for Cessation She says she wants to work on "cutting back on her own first"----"if that doesn't work, then would try medication to help with this" At OV 05/29/15 she says she has decreased to 1/2 ppd and says she's "doing good on that". At OV 10/23/2015---says smoking decreased to about 10 cigarettes a day.   Vitamin D deficiency At OV 01/30/2015--Checked Vitamin D Level---Was low at 21---At f/u OV she confirms that she IS taking the otc Vitamin D 2,000 IU as  directed At OV 10/23/2015---future lab ordered to recheck Vit D when she returns for FLP/CMET  Osteoporosis At OV 01/30/2015--Checked Vitamin D Level---Was low at 21---At f/u OV she confirms that she IS taking the otc Vitamin D 2,000 IU as directed At CPE 02/13/15-- Ordered  Bone Density She is a smoker. --Review of Paper Chart: DEXA scan 03/07/2010 showed: Lumbar spine----- T score -2.5 Left femur---------- T score -1.6 Patient's diagnostic category was osteoporosis by criteria. At that time, she was prescribed Fosamax 70 mg 1 every week. She was  also to take 1200 mg calcium daily. August 2011 vitamin D level had been low and she was given prescription strength vitamin D and was to take this. I reviewed her old paper chart. When she had DEXA 03/07/2010 this is when Fosamax was prescribed. However she then stopped follow-up visits/meds. At most, she took Fosamax for 12 months, beginning 02/2010. Her most recent DEXA scan was performed 03/30/15. T-scores were -1.7 and -2.3. At that time she was told to start Fosamax 70 mg once a week. She has already started on vitamin D supplement and calcium.   Solitary kidney, acquired At visit 05/29/15 reviewed the need for follow-up with urology. She is now agreeable for me to order referral. Noted that she has seen Dr. Mickel Crow in the past and that if he is still available, to schedule appointment with him or someone in his practice. - Ambulatory referral to Urology At OV 10/23/2015--I followed up this---she says she is going to reschedule the appt with Urology---she wasnot able to go to originally scheduled appt.    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 Dr. Etta Grandchild. Staghorn Calculus, Left Kidney Nonfunctioning, With Acute and Chronic Pyelonephritis At visit 05/29/15 reviewed the need for follow-up with urology. She is now agreeable for me to order referral. Noted that she has seen Dr. Mickel Crow in the past and that if he is still available, to  schedule appointment with him or someone in his practice. - Ambulatory referral to Urology At OV 10/23/2015--I followed up this---she says she is going to reschedule the appt with Urology---she wasnot able to go to originally scheduled appt.     THE FOLLOWING IS COPIED FROM HER CPE PERFORMED 03/30/2015: 1. Visit for preventive health examination  A. Screening Labs: 01/30/2015--Did Labs: CBC--------Normal CMET-----Normal FLP--------LDL--119--started Simvastatin 20mg ---At OV 02/13/15-she says she IS taking this TSH-------Normal Vit D------21--Recommended to start otc Vit D 2,000 IU QD---At OV 02/13/15- she says she IS taking this  B. Pap Smear: Pap Smear sent 02/13/2015 - PAP, Thin Prep w/HPV rflx HPV Type 16/18  C. Screening Mammogram: At CPE 02/13/2015---she is agreeable for me to schedule f/u Mammogram - MM Digital Screening; Future SHe had mammogram 03/31/15 ---negative  D. DEXA: At CPE 02/13/2015--she is agreeable for me to schedule f/u DEXA - DG Bone Density; Future See Note under "Osteoporosis" Above  E. Screening for Colorectal Cancer: Pt reports she has had one colonoscopy in past---was performed at [facility] I CHECKED EPIC---SEE NO MENTION OF THIS IN ENCOUNTERS, PROCEDURES, ETC I CHECKED PAPER CHART---CANNOT FIND COPY OF THIS AT NEXT OV--WILL HAVE PT SIGN RELEASE TO GET RECORD/FIND OUT DATE OF LAST COLONOSCOPY --AT OV 05/29/15--- I did have her sign a release form to get copy of last colonoscopy from [facility]  F. Immunizations:  Influenza Vaccine--------------N/A---August------Given here 05/29/15 T dap-----------------------------Given here 01/30/2015 Pneumovax 23 -----------------Given here 01/30/2015. No further Pneumonia Vaccine indicated until age 49 Zostavax:   I Wrote this on her AVS 02/13/2015--to remind her--to call insurance regarding coverage, then call us and let us know.    SHE WILL RETURN FOR LABS ~10/30/2015---CMET, FLP, Vit D She has appt to f/u at  Cardiology in August.  Will schedule f/u OV here in 6 months. F/U sooner if needed.   [address] [place], [place], [place] [facility] 10/23/2015 12:11 PM

## 2015-10-28 ENCOUNTER — Encounter: Payer: Self-pay | Admitting: Physician Assistant

## 2015-11-08 ENCOUNTER — Other Ambulatory Visit: Payer: BLUE CROSS/BLUE SHIELD

## 2015-11-08 DIAGNOSIS — M810 Age-related osteoporosis without current pathological fracture: Secondary | ICD-10-CM

## 2015-11-08 DIAGNOSIS — E785 Hyperlipidemia, unspecified: Secondary | ICD-10-CM

## 2015-11-08 DIAGNOSIS — E559 Vitamin D deficiency, unspecified: Secondary | ICD-10-CM

## 2015-11-08 DIAGNOSIS — I10 Essential (primary) hypertension: Secondary | ICD-10-CM

## 2015-11-08 LAB — LIPID PANEL
CHOL/HDL RATIO: 3.5 ratio (ref <=5.0)
CHOLESTEROL: 196 mg/dL (ref 125–200)
HDL: 56 mg/dL (ref >=46)
LDL CALC: 120 mg/dL (ref <130)
TRIGLYCERIDES: 100 mg/dL (ref <150)
VLDL: 20 mg/dL (ref <30)

## 2015-11-08 LAB — COMPLETE METABOLIC PANEL WITH GFR
ALT: 5 U/L — AB (ref 6–29)
AST: 16 U/L (ref 10–35)
Albumin: 3.9 g/dL (ref 3.6–5.1)
Alkaline Phosphatase: 36 U/L (ref 33–130)
BILIRUBIN TOTAL: 0.4 mg/dL (ref 0.2–1.2)
BUN: 13 mg/dL (ref 7–25)
CHLORIDE: 107 mmol/L (ref 98–110)
CO2: 26 mmol/L (ref 20–31)
CREATININE: 0.71 mg/dL (ref 0.50–0.99)
Calcium: 9.9 mg/dL (ref 8.6–10.4)
GFR, Est African American: >89 mL/min (ref >=60)
GLUCOSE: 92 mg/dL (ref 70–99)
Potassium: 4.6 mmol/L (ref 3.5–5.3)
SODIUM: 140 mmol/L (ref 135–146)
TOTAL PROTEIN: 6.3 g/dL (ref 6.1–8.1)

## 2015-11-09 LAB — VITAMIN D 25 HYDROXY (VIT D DEFICIENCY, FRACTURES): Vit D, 25-Hydroxy: 28 ng/mL — ABNORMAL LOW (ref 30–100)

## 2015-11-16 ENCOUNTER — Other Ambulatory Visit: Payer: Self-pay | Admitting: *Deleted

## 2015-11-16 DIAGNOSIS — E785 Hyperlipidemia, unspecified: Secondary | ICD-10-CM

## 2015-11-16 MED ORDER — VITAMIN D3 50 MCG (2000 UT) PO CAPS: 2000.0000 [IU] | ORAL_CAPSULE | Freq: Every day | ORAL | Status: DC

## 2015-11-16 MED ORDER — SIMVASTATIN 40 MG PO TABS: 40.0000 mg | ORAL_TABLET | Freq: Every day | ORAL | Status: DC

## 2016-01-10 ENCOUNTER — Other Ambulatory Visit: Payer: Self-pay | Admitting: Physician Assistant

## 2016-01-10 NOTE — Telephone Encounter (Signed)
RF 04/24/15 #30 + 2   LOV 10/23/15  OK refill?

## 2016-01-10 NOTE — Telephone Encounter (Signed)
Approved. #[id number]. 

## 2016-01-11 NOTE — Telephone Encounter (Signed)
rx called in

## 2016-01-22 ENCOUNTER — Other Ambulatory Visit: Payer: Self-pay | Admitting: Family Medicine

## 2016-01-22 MED ORDER — SIMVASTATIN 40 MG PO TABS: 40.0000 mg | ORAL_TABLET | Freq: Every day | ORAL | 1 refills | Status: DC

## 2016-01-22 NOTE — Telephone Encounter (Signed)
Medication refilled per protocol. 

## 2016-01-24 ENCOUNTER — Ambulatory Visit: Payer: BLUE CROSS/BLUE SHIELD | Admitting: Cardiology

## 2016-02-15 ENCOUNTER — Ambulatory Visit: Payer: BLUE CROSS/BLUE SHIELD | Admitting: Cardiology

## 2016-03-12 ENCOUNTER — Ambulatory Visit (INDEPENDENT_AMBULATORY_CARE_PROVIDER_SITE_OTHER): Payer: BLUE CROSS/BLUE SHIELD | Admitting: Cardiology

## 2016-03-12 ENCOUNTER — Other Ambulatory Visit: Payer: Self-pay | Admitting: Cardiology

## 2016-03-12 ENCOUNTER — Encounter: Payer: Self-pay | Admitting: Cardiology

## 2016-03-12 DIAGNOSIS — I771 Stricture of artery: Secondary | ICD-10-CM

## 2016-03-12 DIAGNOSIS — E785 Hyperlipidemia, unspecified: Secondary | ICD-10-CM

## 2016-03-12 DIAGNOSIS — I10 Essential (primary) hypertension: Secondary | ICD-10-CM

## 2016-03-12 DIAGNOSIS — Z720 Tobacco use: Secondary | ICD-10-CM

## 2016-03-12 DIAGNOSIS — I708 Atherosclerosis of other arteries: Secondary | ICD-10-CM

## 2016-03-12 DIAGNOSIS — Z951 Presence of aortocoronary bypass graft: Secondary | ICD-10-CM | POA: Diagnosis not present

## 2016-03-12 DIAGNOSIS — R0989 Other specified symptoms and signs involving the circulatory and respiratory systems: Secondary | ICD-10-CM | POA: Diagnosis not present

## 2016-03-12 DIAGNOSIS — F17200 Nicotine dependence, unspecified, uncomplicated: Secondary | ICD-10-CM

## 2016-03-12 NOTE — Patient Instructions (Signed)
Medication Instructions:  Your physician recommends that you continue on your current medications as directed. Please refer to the Current Medication list given to you today.  Labwork: None   Testing/Procedures: Your physician has requested that you have a carotid duplex. This test is an ultrasound of the carotid arteries in your neck. It looks at blood flow through these arteries that supply the brain with blood. Allow one hour for this exam. There are no restrictions or special instructions.  Follow-Up: Your physician wants you to follow-up in: 6 months with Dr Antoine PocheHochrein. You will receive a reminder letter in the mail two months in advance. If you don't receive a letter, please call our office to schedule the follow-up appointment.   Any Other Special Instructions Will Be Listed Below (If Applicable).  PLEASE SCHEDULE CARTIOD DOPPLER AT PATIENTS EARLIEST CONVENIENCE.    If you need a refill on your cardiac medications before your next appointment, please call your pharmacy.

## 2016-03-12 NOTE — Assessment & Plan Note (Addendum)
Still smoking- 1/2 PPD

## 2016-03-12 NOTE — Assessment & Plan Note (Addendum)
On statin Rx-due for lipids and LFTs

## 2016-03-12 NOTE — Assessment & Plan Note (Signed)
10/17/1983---Left Nephrectomy--Dr. Etta GrandchildSural.  Secondary to StagHorn Calculus, Nonfunction

## 2016-03-12 NOTE — Assessment & Plan Note (Signed)
CABG x 1 1996-LIMA-LAD No chest pain. POET NOV 2016 negative for ischemia but positive for HTN response.

## 2016-03-12 NOTE — Progress Notes (Signed)
03/12/2016 [name]   01/19/1951  [id number]  Primary Physician Frazier RichardsIXON,MARY BETH, PA-C Primary Cardiologist: Dr Antoine PocheHochrein  HPI:  Pleasant [age] y/p thin female (99 lbs!). 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 She has a history of CAD and PVD. She had CABG x 1 in 1996 with an LIMA- to her LAD and a Lt common carotid to Lt subclavian bypass by Dr Arbie CookeyEarly in 2008. Other problems include HTN, HLD, and smoking. Her statin was increased by Ms Durwin Noraixon in May and she is to f/u with her later this month. The pt denies any chest pain or unusual dyspnea. She has no issues with her medications. She tells me she has a good appetite and eats well "I just burn it off".    Current Outpatient Prescriptions  Medication Sig Dispense Refill  . alendronate (FOSAMAX) 70 MG tablet Take 1 tablet (70 mg total) by mouth every 7 (seven) days. Take with a full glass of water on an empty stomach. 12 tablet 3  . ALPRAZolam (XANAX) 0.25 MG tablet TAKE 1 TABLET EVERY DAY AS NEEDED 30 tablet 2  . aspirin 81 MG tablet Take 81 mg by mouth daily.    . Cholecalciferol (VITAMIN D3) 2000 units capsule Take 1 capsule (2,000 Units total) by mouth daily.    Marland Kitchen. lisinopril (PRINIVIL,ZESTRIL) 20 MG tablet TAKE 1 TABLET (10 MG TOTAL) BY MOUTH DAILY. 90 tablet 3  . nitroGLYCERIN (NITROSTAT) 0.4 MG SL tablet PLACE 1 TABLET ON THE TONGUE &LET DISSOLVE EVERY 5 MINS AS NEEDED FOR CHEST PAIN 25 tablet 0  . simvastatin (ZOCOR) 40 MG tablet Take 1 tablet (40 mg total) by mouth daily. 90 tablet 1   No current facility-administered medications for this visit.     No Known Allergies  Social History   Social History  . Marital status: Married    Spouse name: N/A  . Number of children: 2  . Years of education: N/A   Occupational History  . Not on file.   Social History Main Topics  . Smoking status: Current Every Day Smoker    Packs/day: 0.25    Years: 40.00    Types: Cigarettes  . Smokeless tobacco: Not on file  . Alcohol use Not  on file  . Drug use: No  . Sexual activity: Yes   Other Topics Concern  . Not on file   Social History Narrative   Lives alone.       Review of Systems: General: negative for chills, fever, night sweats or weight changes.  Cardiovascular: negative for chest pain, dyspnea on exertion, edema, orthopnea, palpitations, paroxysmal nocturnal dyspnea or shortness of breath Dermatological: negative for rash Respiratory: negative for cough or wheezing Urologic: negative for hematuria Abdominal: negative for nausea, vomiting, diarrhea, bright red blood per rectum, melena, or hematemesis Neurologic: negative for visual changes, syncope, or dizziness All other systems reviewed and are otherwise negative except as noted above.    Blood pressure (!) 156/84, pulse 68, height 5\' 6"  (1.676 m), weight 99 lb 3.2 oz (45 kg). BMI 16.0 General appearance: alert, cooperative, appears older than stated age, no distress and thin Neck: no JVD and RCA bruit Lungs: clear to auscultation bilaterally Heart: regular rate and rhythm Extremities: no LE edema Pulses: good Rt RA pulse, diminnished Lt RA, and bilateral LE pulses.  Skin: pale cool dry Neurologic: Grossly normal  EKG NSR ASSESSMENT AND PLAN:   S/P vascular bypass S/P LSCA BPG 2008  Smoker Still smoking- 1/2 PPD  Solitary kidney, acquired 10/17/1983---Left Nephrectomy--Dr. Sural.  Secondary to StagHorn Calculus, Nonfunction  Hyperlipidemia On statin Rx-due for lipids and LFTs  Hx of CABG CABG x 1 1996-LIMA-LAD No chest pain. POET NOV 2016 negative for ischemia but positive for HTN response.   Essential hypertension Acceptable control  Subclavian artery stenosis, left Carotid to Lt subclavian artery bypass 2008 by Dr Arbie Cookey, diminished pulses Lt arm on exam but asymptomatic.   Carotid bruit present Rt CA bruit on exam- asymptomatic   PLAN  She is due for a visit with her PCP in a week or so, I will defer follow up of her lipids  to Ms Durwin Nora- LDL goal should be 70 or less. We discussed smoking cessation but she feels she has done as good as she can do in this regard. I did order Carotid dopplers. She also has Lt SCA and bilateral LE arterial disease on exam but she is asymptomatic at this time.   Corine Shelter PA-C 03/12/2016 3:23 PM

## 2016-03-12 NOTE — Assessment & Plan Note (Addendum)
Carotid to Lt subclavian artery bypass 2008 by Dr Arbie CookeyEarly, diminished pulses Lt arm on exam but asymptomatic.

## 2016-03-12 NOTE — Assessment & Plan Note (Signed)
Rt CA bruit on exam- asymptomatic

## 2016-03-12 NOTE — Assessment & Plan Note (Signed)
Acceptable control 

## 2016-03-12 NOTE — Assessment & Plan Note (Signed)
S/P LSCA BPG 2008

## 2016-03-21 ENCOUNTER — Ambulatory Visit (HOSPITAL_COMMUNITY)
Admission: RE | Admit: 2016-03-21 | Discharge: 2016-03-21 | Disposition: A | Discharge status: Home / Self Care | Payer: BLUE CROSS/BLUE SHIELD | Source: Ambulatory Visit | Attending: Cardiology | Admitting: Cardiology

## 2016-03-21 DIAGNOSIS — I6523 Occlusion and stenosis of bilateral carotid arteries: Secondary | ICD-10-CM | POA: Diagnosis not present

## 2016-03-21 DIAGNOSIS — R0989 Other specified symptoms and signs involving the circulatory and respiratory systems: Secondary | ICD-10-CM

## 2016-04-24 ENCOUNTER — Other Ambulatory Visit: Payer: Self-pay | Admitting: Physician Assistant

## 2016-04-24 DIAGNOSIS — M810 Age-related osteoporosis without current pathological fracture: Secondary | ICD-10-CM

## 2016-04-25 ENCOUNTER — Ambulatory Visit (INDEPENDENT_AMBULATORY_CARE_PROVIDER_SITE_OTHER): Payer: BLUE CROSS/BLUE SHIELD | Admitting: Physician Assistant

## 2016-04-25 ENCOUNTER — Encounter: Payer: Self-pay | Admitting: Physician Assistant

## 2016-04-25 VITALS — BP 140/78 | HR 67 | Temp 97.5°F | Resp 20 | Wt 98.0 lb

## 2016-04-25 DIAGNOSIS — Z87442 Personal history of urinary calculi: Secondary | ICD-10-CM | POA: Diagnosis not present

## 2016-04-25 DIAGNOSIS — J439 Emphysema, unspecified: Secondary | ICD-10-CM | POA: Diagnosis not present

## 2016-04-25 DIAGNOSIS — M816 Localized osteoporosis [Lequesne]: Secondary | ICD-10-CM

## 2016-04-25 DIAGNOSIS — Z951 Presence of aortocoronary bypass graft: Secondary | ICD-10-CM | POA: Diagnosis not present

## 2016-04-25 DIAGNOSIS — E785 Hyperlipidemia, unspecified: Secondary | ICD-10-CM

## 2016-04-25 DIAGNOSIS — E559 Vitamin D deficiency, unspecified: Secondary | ICD-10-CM | POA: Diagnosis not present

## 2016-04-25 DIAGNOSIS — Z905 Acquired absence of kidney: Secondary | ICD-10-CM | POA: Diagnosis not present

## 2016-04-25 DIAGNOSIS — L209 Atopic dermatitis, unspecified: Secondary | ICD-10-CM

## 2016-04-25 DIAGNOSIS — R0989 Other specified symptoms and signs involving the circulatory and respiratory systems: Secondary | ICD-10-CM | POA: Diagnosis not present

## 2016-04-25 DIAGNOSIS — I771 Stricture of artery: Secondary | ICD-10-CM | POA: Diagnosis not present

## 2016-04-25 DIAGNOSIS — F17200 Nicotine dependence, unspecified, uncomplicated: Secondary | ICD-10-CM | POA: Diagnosis not present

## 2016-04-25 DIAGNOSIS — I10 Essential (primary) hypertension: Secondary | ICD-10-CM | POA: Diagnosis not present

## 2016-04-25 MED ORDER — TRIAMCINOLONE ACETONIDE 0.1 % EX CREA: 1.0000 "application " | TOPICAL_CREAM | Freq: Two times a day (BID) | CUTANEOUS | 0 refills | Status: DC

## 2016-04-25 MED ORDER — LISINOPRIL 40 MG PO TABS: 40.0000 mg | ORAL_TABLET | Freq: Every day | ORAL | 0 refills | Status: DC

## 2016-04-25 NOTE — Telephone Encounter (Signed)
Medication refilled per protocol. 

## 2016-04-25 NOTE — Progress Notes (Signed)
Patient ID: Jamie AbbottCarolyn [name] MRN: [id number], DOB: 03/11/1951, 65 y.o. Date of Encounter: @DATE @  Chief Complaint:  Chief Complaint  Patient presents with  . Routine Follow-up OV        HPI: 65 y.o. year old white female  Presents for f/u OV.   THE FOLLOWING IS COPIED FROM HER OV NOTE WITH ME 01/30/2015:    01/30/2015: I REVIEWED HER PAPER CHART AND ABSTRACTED ALL IMPORTANT INFORMATION INTO EPIC  She presents to re-establish care. Her last office visit here in this office was 07/06/2010. She reports that since then, she has had no follow-up and no medical care. She says that any time she is around her friends-- they seem to have all types of aches and pains and problems-- and that she felt that she was in much better shape than any of them so she felt that she really did not need to be coming in. Says that because of her bypass surgery and having a kidney removed in the past, that she has been eating a very healthy diet because of the instructions she was given at the time of those surgeries. Says that she has continued to follow the diet given back then. Says that she has a stationary bike and she actually uses hers!! Says that she mows her grass using a riding mower as well as a push mower. Says that she has continued all of these habits and is very active and has been feeling fine so she felt that she could just go without coming into a doctor's office.  However, she recently decided that she probably better come in and get checked just to make sure.  States that she has been having no angina type symptoms. States that she has been feeling well.   AT THAT INITIAL OV ON 01/30/2015: Checked Full Panel of Labs Ordered Referral to Cardiology Gave Tdap, Pneumovax 23 Discussed her returning for CPE and she was agreeable.  I reviewed her paper chart and abstracted information into Epic   02/13/2015---- She presented for CPE. Had no complaints or concerns to address.    05/29/2015: She states that she saw Cardiology for office visit. Says that they had her scheduled to perform an exercise test and her blood pressure went up during the test so she has an appointment to see him this Friday for follow-up. Reviewed her chart. She had a visit with Dr. Antoine PocheHochrein on 03/01/15. At that time the plan was for her to have an exercise test and follow-up with him one year. However, she does have an appointment with him this coming Friday.  Today she reports that she has decreased smoking and has decreased to about 1/2 pack a day. Says that she is "doing good on that ".  States that she is taking her simvastatin as directed. No myalgias or other adverse effects.  States that she is taking the vitamin D 2000 units daily as directed. States that she is taking the Fosamax once weekly as directed and is taking it every Sunday. She has no specific complaints or concerns today.  10/23/2015: Reviewed her OV note with Dr. Antoine PocheHochrein 10/20/2015--he increased lisinopril to 20mg . Told her to check BMET 10 days. She has lab slip with her today. Says he told her she could have lab drawn here but told her it needs to be done 10days after increasing lisinopril.  She says she has decreased smoking to 10 cigarettes per day.  Says she missed Urology appt. Says she still has  their paperwork at home and she will call to schedule a new appt.  Is taking statin. No myalgias or other adv effects.  She is taking Fosamax. No jaw pain or thigh pain. Taking Vit D.  No complaints or concerns today.    04/25/2016: She has rash on anterior aspect of her ankle and rash on forehead at hairline. Has used hydrocortisone cream--says it gets a little better when she uses that but it doesn't clear up completely. She had OV at Cardiology 02/2016 with Corine Shelter. She had f/u carotid dopplers 03/22/16 which did not show any high-grade stensois.  I reviewed that at last lab with me--11/08/15--had said to increase  simvastatin to 40mg  and recheck labs 6 weeks. She never had f/u labs. She is fasting today. She is taking the Simvastatin 40mg . No myalgias or other adv effects.  She is taking Fosamax as directed. No jaw pain, no femur pain.  Seh is taking BP meds as directed. No lightheadedness or other adv effects.  No other concerns to address today.    Past Medical History/Problem List:   1. Atherosclerosis of native coronary artery of native heart without angina pectoris  2. History of coronary artery bypass surgery 05/20/1995 she underwent coronary artery bypass grafting by Dr. Particia Lather with a LIMA to the LAD. Cardiac catheterization had revealed 95% proximal LAD lesion too close to the left main for endovascular interventional treatment.  CABG was indicated after a positive stress test, for relief of symptoms, and for myocardial preservation.  3. Subclavian artery stenosis, left She had complained of some mild chest discomfort so underwent a Cardiolite scan 02/23/2007. This revealed evidence of mild anteroseptal ischemia with normal EF. She was also noted to have unequal upper extremity blood pressures on her initial visit with Dr. Jenne Campus with blood pressure 120/70 in the right arm and 90/60 in the left arm. This raised suspicion for left subclavian stenosis.  She underwent left subclavian ultrasound 03/05/2007. Revealed widely patent right and left internal carotids. There were abnormal waveform in the left subclavian suggesting > 50% stenosis. It was felt that she had probable left subclavian stenosis which was concerning that may be compromising her LIMA to her LAD given her abnormal Cardiolite scan. She underwent cardiac catheterization 03/27/2007 revealing 50% proximal LAD lesion. The IMA was visualized with 70% lesion in the IMA prior to the insertion site. The ramus and OM had no significant disease. There was 60% lesion in the ostial portion of the RCA. This showed to have 99% ostial left  subclavian stenosis with calcification and TIMI 1 flow into the left subclavian. She was scheduled to undergo left subclavian angiogram with possible PTA/stenting by Dr. Allyson Sabal on 04/13/2007. However, failed percutaneous revascularization.  Had left carotid -  subclavian bypass in 2008.  4. S/P vascular bypass ------ left carotid -  subclavian bypass in 2008.  5. Hyperlipidemia When she was seeing me in the past she was on Crestor 20 mg daily.  6. Smoker  7. Vitamin D deficiency  8. Osteoporosis DEXA scan 03/07/2010 showed: Lumbar spine----- T score -2.5 Left femur---------- T score -1.6 Patient's diagnostic category was osteoporosis by criteria. At that time, she was prescribed Fosamax 70 mg 1 every week. She was also to take 1200 mg calcium daily. August 2011 vitamin D level had been low and she was given prescription strength vitamin D and was to take this.    Home Meds: None  Allergies: No Known Allergies  Social History   Social  History  . Marital status: Married    Spouse name: N/A  . Number of children: 2  . Years of education: N/A   Occupational History  . Not on file.   Social History Main Topics  . Smoking status: Current Every Day Smoker    Packs/day: 0.25    Years: 40.00    Types: Cigarettes  . Smokeless tobacco: Not on file  . Alcohol use Not on file  . Drug use: No  . Sexual activity: Yes   Other Topics Concern  . Not on file   Social History Narrative   Lives alone.      Family History  Problem Relation Age of Onset  . Depression Sister   . COPD Father   . Cancer Sister     hysterectomy sec to cancer  . Cancer Sister     cervical cancer  . COPD Brother      Review of Systems:  See HPI for pertinent ROS. All other ROS negative.    Physical Exam: Blood pressure 140/78, pulse 67, temperature 97.5 F (36.4 C), temperature source Oral, resp. rate 20, weight 98 lb (44.5 kg), SpO2 92 %., Body mass index is 15.82 kg/m. General: Thin, WNWD  WF. Appears in no acute distress. Neck: Supple. No thyromegaly. No lymphadenopathy. She has soft carotid bruits bilaterally. She has a soft left subclavian bruit as well. Lungs: Clear bilaterally to auscultation without wheezes, rales, or rhonchi. Breathing is unlabored. Heart: RRR with S1 S2. No murmurs, rubs, or gallops. Abdomen: Soft, non-tender, non-distended with normoactive bowel sounds. No hepatomegaly. No rebound/guarding. No obvious abdominal masses. Musculoskeletal:  Strength and tone normal for age. Extremities/Skin: Anterior aspect of ankle: she has well-demarcated patch-- ~1 inch oval shape--thick scale Forehead at hairline: She has linear area of well-demarcated area of thick scale Neuro: Alert and oriented X 3. Moves all extremities spontaneously. Gait is normal. CNII-XII grossly in tact. Psych:  Responds to questions appropriately with a normal affect.     ASSESSMENT AND PLAN:  66 y.o. year old female with   Atopic Dermatitis (Possible Psoriasis--but not on knees, usual areas) 04/25/2016: Treat with Triamcinolone Cream--Recheck at f/u OV 2 weeks.   HTN 04/25/2016: BP slightly elevated at LOV here and at LOV at Cardiology. She says she didn't eat anything today b/c she "didnt want to eat  a hotdog or somehting that woulld make her blood pressure be up"  Will increase Lisinopril from 20mg  to 40mg .   Will have her return for f/u OV 2 weeks to recheck BP and BMET  Hyperlipidemia 04/25/2016: Increased Simva to 40mg  10/2015. Never had f/u labs. Recheck now.   Atherosclerosis of native coronary artery of native heart without angina pectoris 04/25/2016: At visit 01/2015 I ordered referral back to Cardiology. Cardiology is now managing this.   History of coronary artery bypass surgery 04/25/2016:-At visit 01/2015 I ordered referral back to Cardiology. Cardiology is now managing this.   Subclavian artery stenosis, left 04/25/2016:At visit 01/2015 I ordered referral back to Cardiology.  Cardiology is now managing this.  S/P vascular bypass 04/25/2016:At visit 01/2015 I ordered referral back to Cardiology. Cardiology is now managing this.  Smoker At OV 02/13/2015--Discussed Medication for Cessation She says she wants to work on "cutting back on her own first"----"if that doesn't work, then would try medication to help with this" At OV 05/29/15 she says she has decreased to 1/2 ppd and says she's "doing good on that". At OV 10/23/2015---says smoking decreased to about 10  cigarettes a day.  At OV 04/25/2016--says still at 10 cigarettes per day  Vitamin D deficiency At OV 01/30/2015--Checked Vitamin D Level---Was low at 21---At f/u OV she confirms that she IS taking the otc Vitamin D 2,000 IU as directed At OV 10/23/2015---future lab ordered to recheck Vit D when she returns for FLP/CMET  Osteoporosis At OV 01/30/2015--Checked Vitamin D Level---Was low at 21---At f/u OV she confirms that she IS taking the otc Vitamin D 2,000 IU as directed At CPE 02/13/15-- Ordered  Bone Density She is a smoker. --Review of Paper Chart: DEXA scan 03/07/2010 showed: Lumbar spine----- T score -2.5 Left femur---------- T score -1.6 Patient's diagnostic category was osteoporosis by criteria. At that time, she was prescribed Fosamax 70 mg 1 every week. She was also to take 1200 mg calcium daily. August 2011 vitamin D level had been low and she was given prescription strength vitamin D and was to take this. I reviewed her old paper chart. When she had DEXA 03/07/2010 this is when Fosamax was prescribed. However she then stopped follow-up visits/meds. At most, she took Fosamax for 12 months, beginning 02/2010. Her most recent DEXA scan was performed 03/30/15. T-scores were -1.7 and -2.3. At that time she was told to start Fosamax 70 mg once a week. She has already started on vitamin D supplement and calcium. 04/25/2016: Cont Fosamax, Calcium, Vit D   Solitary kidney, acquired At visit 05/29/15 reviewed the  need for follow-up with urology. She is now agreeable for me to order referral. Noted that she has seen Dr. Mickel Crow in the past and that if he is still available, to schedule appointment with him or someone in his practice. - Ambulatory referral to Urology At OV 10/23/2015--I followed up this---she says she is going to reschedule the appt with Urology---she wasnot able to go to originally scheduled appt.    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 Dr. Etta Grandchild. Staghorn Calculus, Left Kidney Nonfunctioning, With Acute and Chronic Pyelonephritis At visit 05/29/15 reviewed the need for follow-up with urology. She is now agreeable for me to order referral. Noted that she has seen Dr. Mickel Crow in the past and that if he is still available, to schedule appointment with him or someone in his practice. - Ambulatory referral to Urology At OV 10/23/2015--I followed up this---she says she is going to reschedule the appt with Urology---she wasnot able to go to originally scheduled appt.     THE FOLLOWING IS COPIED FROM HER CPE PERFORMED 03/30/2015: 1. Visit for preventive health examination  A. Screening Labs: 01/30/2015--Did Labs: CBC--------Normal CMET-----Normal FLP--------LDL--119--started Simvastatin ---At OV 02/13/15-she says she IS taking this TSH-------Normal Vit D------21--Recommended to start otc Vit D 2,000 IU QD---At OV 02/13/15- she says she IS taking this  B. Pap Smear: Pap Smear sent 02/13/2015 - PAP, Thin Prep w/HPV rflx HPV Type 16/18  C. Screening Mammogram: At CPE 02/13/2015---she is agreeable for me to schedule f/u Mammogram - MM Digital Screening; Future SHe had mammogram 03/31/15 ---negative  D. DEXA: At CPE 02/13/2015--she is agreeable for me to schedule f/u DEXA - DG Bone Density; Future See Note under "Osteoporosis" Above  E. Screening for Colorectal Cancer: Pt reports she has had one colonoscopy in past---was performed at [facility] I CHECKED EPIC---SEE  NO MENTION OF THIS IN ENCOUNTERS, PROCEDURES, ETC I CHECKED PAPER CHART---CANNOT FIND COPY OF THIS AT NEXT OV--WILL HAVE PT SIGN RELEASE TO GET RECORD/FIND OUT DATE OF LAST COLONOSCOPY --AT OV 05/29/15--- I did have her  sign a release form to get copy of last colonoscopy from [facility]  F. Immunizations:  Influenza Vaccine--------------N/A---August------Given here 05/29/15 T dap-----------------------------Given here 01/30/2015 Pneumovax 23 -----------------Given here 01/30/2015. No further Pneumonia Vaccine indicated until age 89 Zostavax:   I Wrote this on her AVS 02/13/2015--to remind her--to call insurance regarding coverage, then call us and let us know.    Murray Hodgkins [place], [place], [facility] 04/25/2016 2:18 PM

## 2016-04-26 LAB — LIPID PANEL
CHOLESTEROL: 164 mg/dL (ref <200)
HDL: 54 mg/dL (ref >50)
LDL CALC: 97 mg/dL
TRIGLYCERIDES: 66 mg/dL (ref <150)
Total CHOL/HDL Ratio: 3 Ratio (ref <5.0)
VLDL: 13 mg/dL (ref <30)

## 2016-04-26 LAB — COMPLETE METABOLIC PANEL WITH GFR
ALK PHOS: 37 U/L (ref 33–130)
ALT: 7 U/L (ref 6–29)
AST: 22 U/L (ref 10–35)
Albumin: 4 g/dL (ref 3.6–5.1)
BUN: 19 mg/dL (ref 7–25)
CALCIUM: 10.1 mg/dL (ref 8.6–10.4)
CO2: 24 mmol/L (ref 20–31)
Chloride: 108 mmol/L (ref 98–110)
Creat: 0.81 mg/dL (ref 0.50–0.99)
GFR, EST AFRICAN AMERICAN: 89 mL/min (ref >=60)
GFR, Est Non African American: 77 mL/min (ref >=60)
GLUCOSE: 76 mg/dL (ref 70–99)
POTASSIUM: 5.3 mmol/L (ref 3.5–5.3)
Sodium: 140 mmol/L (ref 135–146)
TOTAL PROTEIN: 6.4 g/dL (ref 6.1–8.1)
Total Bilirubin: 0.2 mg/dL (ref 0.2–1.2)

## 2016-05-13 ENCOUNTER — Encounter: Payer: Self-pay | Admitting: Physician Assistant

## 2016-05-13 ENCOUNTER — Ambulatory Visit (INDEPENDENT_AMBULATORY_CARE_PROVIDER_SITE_OTHER): Payer: BLUE CROSS/BLUE SHIELD | Admitting: Physician Assistant

## 2016-05-13 VITALS — BP 130/60 | HR 93 | Temp 98.0°F | Resp 18 | Wt 97.0 lb

## 2016-05-13 DIAGNOSIS — Z23 Encounter for immunization: Secondary | ICD-10-CM | POA: Diagnosis not present

## 2016-05-13 DIAGNOSIS — I10 Essential (primary) hypertension: Secondary | ICD-10-CM | POA: Diagnosis not present

## 2016-05-13 NOTE — Progress Notes (Signed)
Patient ID: Jamie AbbottCarolyn [name] MRN: [id number], DOB: 03/11/1951, 65 y.o. Date of Encounter: @DATE @  Chief Complaint:  Chief Complaint  Patient presents with  . Routine Follow-up OV        HPI: 65 y.o. year old white female  Presents for f/u OV.   THE FOLLOWING IS COPIED FROM HER OV NOTE WITH ME 01/30/2015:    01/30/2015: I REVIEWED HER PAPER CHART AND ABSTRACTED ALL IMPORTANT INFORMATION INTO EPIC  She presents to re-establish care. Her last office visit here in this office was 07/06/2010. She reports that since then, she has had no follow-up and no medical care. She says that any time she is around her friends-- they seem to have all types of aches and pains and problems-- and that she felt that she was in much better shape than any of them so she felt that she really did not need to be coming in. Says that because of her bypass surgery and having a kidney removed in the past, that she has been eating a very healthy diet because of the instructions she was given at the time of those surgeries. Says that she has continued to follow the diet given back then. Says that she has a stationary bike and she actually uses hers!! Says that she mows her grass using a riding mower as well as a push mower. Says that she has continued all of these habits and is very active and has been feeling fine so she felt that she could just go without coming into a doctor's office.  However, she recently decided that she probably better come in and get checked just to make sure.  States that she has been having no angina type symptoms. States that she has been feeling well.   AT THAT INITIAL OV ON 01/30/2015: Checked Full Panel of Labs Ordered Referral to Cardiology Gave Tdap, Pneumovax 23 Discussed her returning for CPE and she was agreeable.  I reviewed her paper chart and abstracted information into Epic   02/13/2015---- She presented for CPE. Had no complaints or concerns to address.    05/29/2015: She states that she saw Cardiology for office visit. Says that they had her scheduled to perform an exercise test and her blood pressure went up during the test so she has an appointment to see him this Friday for follow-up. Reviewed her chart. She had a visit with Dr. Antoine PocheHochrein on 03/01/15. At that time the plan was for her to have an exercise test and follow-up with him one year. However, she does have an appointment with him this coming Friday.  Today she reports that she has decreased smoking and has decreased to about 1/2 pack a day. Says that she is "doing good on that ".  States that she is taking her simvastatin as directed. No myalgias or other adverse effects.  States that she is taking the vitamin D 2000 units daily as directed. States that she is taking the Fosamax once weekly as directed and is taking it every Sunday. She has no specific complaints or concerns today.  10/23/2015: Reviewed her OV note with Dr. Antoine PocheHochrein 10/20/2015--he increased lisinopril to 20mg . Told her to check BMET 10 days. She has lab slip with her today. Says he told her she could have lab drawn here but told her it needs to be done 10days after increasing lisinopril.  She says she has decreased smoking to 10 cigarettes per day.  Says she missed Urology appt. Says she still has  their paperwork at home and she will call to schedule a new appt.  Is taking statin. No myalgias or other adv effects.  She is taking Fosamax. No jaw pain or thigh pain. Taking Vit D.  No complaints or concerns today.    04/25/2016: She has rash on anterior aspect of her ankle and rash on forehead at hairline. Has used hydrocortisone cream--says it gets a little better when she uses that but it doesn't clear up completely. She had OV at Cardiology 02/2016 with Corine ShelterLuke Kilroy. She had f/u carotid dopplers 03/22/16 which did not show any high-grade stensois.  I reviewed that at last lab with me--11/08/15--had said to increase  simvastatin to 40mg  and recheck labs 6 weeks. She never had f/u labs. She is fasting today. She is taking the Simvastatin 40mg . No myalgias or other adv effects.  She is taking Fosamax as directed. No jaw pain, no femur pain.  She is taking BP meds as directed. No lightheadedness or other adv effects.  No other concerns to address today.   05/13/2016: At OV 04/25/16 I increased the dose of lisinopril from 20 mg to 40 mg. she was to follow-up in 2 weeks to recheck blood pressure and bmet. Today she reports that she is taking the increased dose of lisinopril 40 mg. She is having no adverse effects. Also states that the rash that we treated at the last visit is improved. Rash that was at her hairline/4 head has resolved and the one on the anterior aspect of her ankle is improving.    Past Medical History/Problem List:   1. Atherosclerosis of native coronary artery of native heart without angina pectoris  2. History of coronary artery bypass surgery 05/20/1995 she underwent coronary artery bypass grafting by Dr. Particia Latherharles Wilson with a LIMA to the LAD. Cardiac catheterization had revealed 95% proximal LAD lesion too close to the left main for endovascular interventional treatment.  CABG was indicated after a positive stress test, for relief of symptoms, and for myocardial preservation.  3. Subclavian artery stenosis, left She had complained of some mild chest discomfort so underwent a Cardiolite scan 02/23/2007. This revealed evidence of mild anteroseptal ischemia with normal EF. She was also noted to have unequal upper extremity blood pressures on her initial visit with Dr. Jenne CampusMcQueen with blood pressure 120/70 in the right arm and 90/60 in the left arm. This raised suspicion for left subclavian stenosis.  She underwent left subclavian ultrasound 03/05/2007. Revealed widely patent right and left internal carotids. There were abnormal waveform in the left subclavian suggesting > 50% stenosis. It was  felt that she had probable left subclavian stenosis which was concerning that may be compromising her LIMA to her LAD given her abnormal Cardiolite scan. She underwent cardiac catheterization 03/27/2007 revealing 50% proximal LAD lesion. The IMA was visualized with 70% lesion in the IMA prior to the insertion site. The ramus and OM had no significant disease. There was 60% lesion in the ostial portion of the RCA. This showed to have 99% ostial left subclavian stenosis with calcification and TIMI 1 flow into the left subclavian. She was scheduled to undergo left subclavian angiogram with possible PTA/stenting by Dr. Allyson SabalBerry on 04/13/2007. However, failed percutaneous revascularization.  Had left carotid -  subclavian bypass in 2008.  4. S/P vascular bypass ------ left carotid -  subclavian bypass in 2008.  5. Hyperlipidemia When she was seeing me in the past she was on Crestor 20 mg daily.  6. Smoker  7.  Vitamin D deficiency  8. Osteoporosis DEXA scan 03/07/2010 showed: Lumbar spine----- T score -2.5 Left femur---------- T score -1.6 Patient's diagnostic category was osteoporosis by criteria. At that time, she was prescribed Fosamax 70 mg 1 every week. She was also to take 1200 mg calcium daily. August 2011 vitamin D level had been low and she was given prescription strength vitamin D and was to take this.    Home Meds: None  Allergies: No Known Allergies  Social History   Social History  . Marital status: Married    Spouse name: N/A  . Number of children: 2  . Years of education: N/A   Occupational History  . Not on file.   Social History Main Topics  . Smoking status: Current Every Day Smoker    Packs/day: 0.25    Years: 40.00    Types: Cigarettes  . Smokeless tobacco: Not on file  . Alcohol use Not on file  . Drug use: No  . Sexual activity: Yes   Other Topics Concern  . Not on file   Social History Narrative   Lives alone.      Family History  Problem Relation  Age of Onset  . Depression Sister   . COPD Father   . Cancer Sister     hysterectomy sec to cancer  . Cancer Sister     cervical cancer  . COPD Brother      Review of Systems:  See HPI for pertinent ROS. All other ROS negative.    Physical Exam: Blood pressure 130/60, pulse 93, temperature 98 F (36.7 C), temperature source Oral, resp. rate 18, weight 97 lb (44 kg), SpO2 97 %., There is no height or weight on file to calculate BMI. General: Thin, WNWD WF. Appears in no acute distress. Neck: Supple. No thyromegaly. No lymphadenopathy. She has soft carotid bruits bilaterally. She has a soft left subclavian bruit as well. Lungs: Clear bilaterally to auscultation without wheezes, rales, or rhonchi. Breathing is unlabored. Heart: RRR with S1 S2. No murmurs, rubs, or gallops. Abdomen: Soft, non-tender, non-distended with normoactive bowel sounds. No hepatomegaly. No rebound/guarding. No obvious abdominal masses. Musculoskeletal:  Strength and tone normal for age. Extremities/Skin: Anterior aspect of ankle: she has well-demarcated patch-- ~1 inch oval shape--thick scale---this is improved at OV 05/13/16--compared to 04/25/2016 Forehead at hairline: Rash has resolved--at OV 05/13/2016 Neuro: Alert and oriented X 3. Moves all extremities spontaneously. Gait is normal. CNII-XII grossly in tact. Psych:  Responds to questions appropriately with a normal affect.     ASSESSMENT AND PLAN:  65 y.o. year old female with   Atopic Dermatitis (Possible Psoriasis--but not on knees, usual areas) 04/25/2016: Treat with Triamcinolone Cream--Recheck at f/u OV 2 weeks.  05/13/2016: The site at her hairline/4 he had has resolved. The site at the anterior aspect of her ankle is improved. Continue applying the cream to her ankle until this resolves.\  HTN 04/25/2016: BP slightly elevated at LOV here and at LOV at Cardiology. She says she didn't eat anything today b/c she "didnt want to eat  a hotdog or  somehting that woulld make her blood pressure be up"  Will increase Lisinopril from 20mg  to 40mg .   Will have her return for f/u OV 2 weeks to recheck BP and BMET 05/13/2016: She is taking the lisinopril 40 mg now. She is having no adverse effects. Blood pressure is now at goal. Will recheck BMP T on new dose.    THE FOLLOWING IS COPIED [facility]  FROM OV 04/25/2016--IS NOT ADDRESSED AT OV 05/13/2016:  Hyperlipidemia 04/25/2016: Increased Simva to 40mg  10/2015. Never had f/u labs. Recheck now.   Atherosclerosis of native coronary artery of native heart without angina pectoris 04/25/2016: At visit 01/2015 I ordered referral back to Cardiology. Cardiology is now managing this.   History of coronary artery bypass surgery 04/25/2016:-At visit 01/2015 I ordered referral back to Cardiology. Cardiology is now managing this.   Subclavian artery stenosis, left 04/25/2016:At visit 01/2015 I ordered referral back to Cardiology. Cardiology is now managing this.  S/P vascular bypass 04/25/2016:At visit 01/2015 I ordered referral back to Cardiology. Cardiology is now managing this.  Smoker At OV 02/13/2015--Discussed Medication for Cessation She says she wants to work on "cutting back on her own first"----"if that doesn't work, then would try medication to help with this" At OV 05/29/15 she says she has decreased to 1/2 ppd and says she's "doing good on that". At OV 10/23/2015---says smoking decreased to about 10 cigarettes a day.  At OV 04/25/2016--says still at 10 cigarettes per day  Vitamin D deficiency At OV 01/30/2015--Checked Vitamin D Level---Was low at 21---At f/u OV she confirms that she IS taking the otc Vitamin D 2,000 IU as directed At OV 10/23/2015---future lab ordered to recheck Vit D when she returns for FLP/CMET  Osteoporosis At OV 01/30/2015--Checked Vitamin D Level---Was low at 21---At f/u OV she confirms that she IS taking the otc Vitamin D 2,000 IU as directed At CPE 02/13/15-- Ordered  Bone  Density She is a smoker. --Review of Paper Chart: DEXA scan 03/07/2010 showed: Lumbar spine----- T score -2.5 Left femur---------- T score -1.6 Patient's diagnostic category was osteoporosis by criteria. At that time, she was prescribed Fosamax 70 mg 1 every week. She was also to take 1200 mg calcium daily. August 2011 vitamin D level had been low and she was given prescription strength vitamin D and was to take this. I reviewed her old paper chart. When she had DEXA 03/07/2010 this is when Fosamax was prescribed. However she then stopped follow-up visits/meds. At most, she took Fosamax for 12 months, beginning 02/2010. Her most recent DEXA scan was performed 03/30/15. T-scores were -1.7 and -2.3. At that time she was told to start Fosamax 70 mg once a week. She has already started on vitamin D supplement and calcium. 04/25/2016: Cont Fosamax, Calcium, Vit D   Solitary kidney, acquired At visit 05/29/15 reviewed the need for follow-up with urology. She is now agreeable for me to order referral. Noted that she has seen Dr. Mickel Crowon Sural in the past and that if he is still available, to schedule appointment with him or someone in his practice. - Ambulatory referral to Urology At OV 10/23/2015--I followed up this---she says she is going to reschedule the appt with Urology---she wasnot able to go to originally scheduled appt.    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 Dr. Etta GrandchildSural. Staghorn Calculus, Left Kidney Nonfunctioning, With Acute and Chronic Pyelonephritis At visit 05/29/15 reviewed the need for follow-up with urology. She is now agreeable for me to order referral. Noted that she has seen Dr. Mickel Crowon Sural in the past and that if he is still available, to schedule appointment with him or someone in his practice. - Ambulatory referral to Urology At OV 10/23/2015--I followed up this---she says she is going to reschedule the appt with Urology---she wasnot able to go to originally scheduled  appt.     THE FOLLOWING IS COPIED FROM HER CPE PERFORMED 03/30/2015: 1.  Visit for preventive health examination  A. Screening Labs: 01/30/2015--Did Labs: CBC--------Normal CMET-----Normal FLP--------LDL--119--started Simvastatin 20mg ---At OV 02/13/15-she says she IS taking this TSH-------Normal Vit D------21--Recommended to start otc Vit D 2,000 IU QD---At OV 02/13/15- she says she IS taking this  B. Pap Smear: Pap Smear sent 02/13/2015 - PAP, Thin Prep w/HPV rflx HPV Type 16/18  C. Screening Mammogram: At CPE 02/13/2015---she is agreeable for me to schedule f/u Mammogram - MM Digital Screening; Future SHe had mammogram 03/31/15 ---negative  D. DEXA: At CPE 02/13/2015--she is agreeable for me to schedule f/u DEXA - DG Bone Density; Future See Note under "Osteoporosis" Above  E. Screening for Colorectal Cancer: Pt reports she has had one colonoscopy in past---was performed at [facility] I CHECKED EPIC---SEE NO MENTION OF THIS IN ENCOUNTERS, PROCEDURES, ETC I CHECKED PAPER CHART---CANNOT FIND COPY OF THIS AT NEXT OV--WILL HAVE PT SIGN RELEASE TO GET RECORD/FIND OUT DATE OF LAST COLONOSCOPY --AT OV 05/29/15--- I did have her sign a release form to get copy of last colonoscopy from [facility]  F. Immunizations:  Influenza Vaccine--------------N/A---August------Given here 05/29/15 T dap-----------------------------Given here 01/30/2015 Pneumovax 23 -----------------Given here 01/30/2015. No further Pneumonia Vaccine indicated until age 40 Zostavax:   I Wrote this on her AVS 02/13/2015--to remind her--to call insurance regarding coverage, then call us and let us know.    Murray Hodgkins [place], [place], [facility] 05/13/2016 2:09 PM

## 2016-05-13 NOTE — Addendum Note (Signed)
Addended by: Phineas SemenJOHNSON, TIFFANY A on: 05/13/2016 02:28 PM   Modules accepted: Orders

## 2016-05-14 LAB — BASIC METABOLIC PANEL WITH GFR
BUN: 21 mg/dL (ref 7–25)
CALCIUM: 10.3 mg/dL (ref 8.6–10.4)
CO2: 28 mmol/L (ref 20–31)
CREATININE: 1.31 mg/dL — AB (ref 0.50–0.99)
Chloride: 106 mmol/L (ref 98–110)
GFR, Est African American: 50 mL/min — ABNORMAL LOW (ref >=60)
GFR, Est Non African American: 43 mL/min — ABNORMAL LOW (ref >=60)
Glucose, Bld: 126 mg/dL — ABNORMAL HIGH (ref 70–99)
Potassium: 5.1 mmol/L (ref 3.5–5.3)
SODIUM: 139 mmol/L (ref 135–146)

## 2016-06-18 ENCOUNTER — Other Ambulatory Visit: Payer: Self-pay | Admitting: Physician Assistant

## 2016-06-18 DIAGNOSIS — I10 Essential (primary) hypertension: Secondary | ICD-10-CM

## 2016-06-18 DIAGNOSIS — L209 Atopic dermatitis, unspecified: Secondary | ICD-10-CM

## 2016-07-11 ENCOUNTER — Other Ambulatory Visit: Payer: Self-pay

## 2016-07-12 ENCOUNTER — Other Ambulatory Visit: Payer: Self-pay

## 2016-07-12 DIAGNOSIS — I10 Essential (primary) hypertension: Secondary | ICD-10-CM

## 2016-07-12 MED ORDER — LISINOPRIL 40 MG PO TABS: 40.0000 mg | ORAL_TABLET | Freq: Every day | ORAL | 1 refills | Status: DC

## 2016-07-12 NOTE — Telephone Encounter (Signed)
Rx filled per protocol  

## 2016-07-19 ENCOUNTER — Other Ambulatory Visit: Payer: Self-pay | Admitting: Physician Assistant

## 2016-08-16 ENCOUNTER — Encounter: Payer: Self-pay | Admitting: Physician Assistant

## 2016-08-16 ENCOUNTER — Ambulatory Visit: Payer: BLUE CROSS/BLUE SHIELD | Admitting: Physician Assistant

## 2016-08-16 NOTE — Progress Notes (Deleted)
Cardiology Office Note   Date:  08/16/2016   ID:  [name], DOB 1951-01-23, MRN [id number]  PCP:  Frazier Richards, PA-C  Cardiologist:   Dr. Antoine Poche 10/19/2015 Eda Paschal Kilroy PA-C 03/12/2016 Barrett, Bjorn Loser, PA-C   No chief complaint on file.   History of Present Illness: [name] is a 66 y.o. female with a history of L subclavian bpg, CABG 1996 w/ LIMA-LAD, HTN, HLD, CVA, L nephrectomy, tob use, COPD, POET 2016 neg ischemia, carotid bruits  [name] presents for ***   Past Medical History:  Diagnosis Date  . Allergy   . COPD (chronic obstructive pulmonary disease) (HCC)   . Coronary artery disease 05/16/1995   Cathed in November, 05/20/1995--CABG x 1-----LIMA to LAD  . Hyperlipidemia   . Hypertension   . Myocardial infarction   . Nephrolithiasis    Left Nephrectomy   . Smoker   . Stroke [facility])     Past Surgical History:  Procedure Laterality Date  . CAROTID ARTERY - SUBCLAVIAN ARTERY BYPASS GRAFT Left 04/18/2007  . CORONARY ARTERY BYPASS GRAFT    . KIDNEY SURGERY Left    66 yo  . Left Nephrectomy Left 10/17/1983   Dr. Sural---StagHornCalculus, Left Kidney Nonfunctioning wiht Acute and Chronic Pyelonephritis    Current Outpatient Prescriptions  Medication Sig Dispense Refill  . alendronate (FOSAMAX) 70 MG tablet TAKE 1 TABLET BY MOUTH ONCE WEEKLY TAKE WITH A FULL GLASS OF WATER ON AN EMPTY STOMACH 12 tablet 3  . ALPRAZolam (XANAX) 0.25 MG tablet TAKE 1 TABLET EVERY DAY AS NEEDED 30 tablet 2  . aspirin 81 MG tablet Take 81 mg by mouth daily.    . Cholecalciferol (VITAMIN D3) 2000 units capsule Take 1 capsule (2,000 Units total) by mouth daily.    Marland Kitchen lisinopril (PRINIVIL,ZESTRIL) 40 MG tablet Take 1 tablet (40 mg total) by mouth daily. 90 tablet 1  . nitroGLYCERIN (NITROSTAT) 0.4 MG SL tablet PLACE 1 TABLET ON THE TONGUE &LET DISSOLVE EVERY 5 MINS AS NEEDED FOR CHEST PAIN 25 tablet 0  . simvastatin (ZOCOR) 40 MG tablet TAKE 1 TABLET BY MOUTH EVERY  DAY 90 tablet 1  . triamcinolone cream (KENALOG) 0.1 % APPLY 1 APPLICATION TOPICALLY 2 (TWO) TIMES DAILY. 30 g 0   No current facility-administered medications for this visit.     Allergies:   Patient has no known allergies.    Social History:  The patient  reports that she has been smoking Cigarettes.  She has a 10.00 pack-year smoking history. She does not have any smokeless tobacco history on file. She reports that she does not use drugs.   Family History:  The patient's family history includes COPD in her brother and father; Cancer in her sister and sister; Depression in her sister.    ROS:  Please see the history of present illness. All other systems are reviewed and negative.    PHYSICAL EXAM: VS:  There were no vitals taken for this visit. , BMI There is no height or weight on file to calculate BMI. GEN: Well nourished, well developed, female in no acute distress  HEENT: normal for age  Neck: no JVD, no carotid bruit, no masses Cardiac: RRR; no murmur, no rubs, or gallops Respiratory:  clear to auscultation bilaterally, normal work of breathing GI: soft, nontender, nondistended, + BS MS: no deformity or atrophy; no edema; distal pulses are 2+ in all 4 extremities   Skin: warm and dry, no rash Neuro:  Strength and sensation  are intact Psych: euthymic mood, full affect   EKG:  EKG {ACTION; IS/IS RUE:[id number]}OT:[id number]} ordered today. The ekg ordered today demonstrates ***   Recent Labs: 04/25/2016: ALT 7 05/13/2016: BUN 21; Creat 1.31; Potassium 5.1; Sodium 139    Lipid Panel    Component Value Date/Time   CHOL 164 04/25/2016 1444   TRIG 66 04/25/2016 1444   HDL 54 04/25/2016 1444   CHOLHDL 3.0 04/25/2016 1444   VLDL 13 04/25/2016 1444   LDLCALC 97 04/25/2016 1444     Wt Readings from Last 3 Encounters:  05/13/16 97 lb (44 kg)  04/25/16 98 lb (44.5 kg)  03/12/16 99 lb 3.2 oz (45 kg)     Other studies Reviewed: Additional studies/ records that were reviewed today  include: ***.  ASSESSMENT AND PLAN:  1.  ***   Current medicines are reviewed at length with the patient today.  The patient {ACTIONS; HAS/DOES NOT HAVE:19233} concerns regarding medicines.  The following changes have been made:  {PLAN; NO CHANGE:13088:s}  Labs/ tests ordered today include: *** No orders of the defined types were placed in this encounter.    Disposition:   FU with ***  Signed, Theodore DemarkBarrett, Rhonda, PA-C  08/16/2016 1:27 PM    [facility] Phone: [phone]; Fax: [phone]  This note was written with the assistance of speech recognition software. Please excuse any transcriptional errors.

## 2016-08-26 ENCOUNTER — Ambulatory Visit: Payer: BLUE CROSS/BLUE SHIELD | Admitting: Physician Assistant

## 2016-08-29 ENCOUNTER — Ambulatory Visit (INDEPENDENT_AMBULATORY_CARE_PROVIDER_SITE_OTHER): Payer: BLUE CROSS/BLUE SHIELD | Admitting: Physician Assistant

## 2016-08-29 ENCOUNTER — Encounter: Payer: Self-pay | Admitting: Physician Assistant

## 2016-08-29 VITALS — BP 154/76 | HR 88 | Ht 67.0 in | Wt 97.0 lb

## 2016-08-29 DIAGNOSIS — I25810 Atherosclerosis of coronary artery bypass graft(s) without angina pectoris: Secondary | ICD-10-CM

## 2016-08-29 DIAGNOSIS — I10 Essential (primary) hypertension: Secondary | ICD-10-CM

## 2016-08-29 DIAGNOSIS — R0989 Other specified symptoms and signs involving the circulatory and respiratory systems: Secondary | ICD-10-CM | POA: Diagnosis not present

## 2016-08-29 MED ORDER — AMLODIPINE BESYLATE 5 MG PO TABS: 5.0000 mg | ORAL_TABLET | Freq: Every day | ORAL | 3 refills | Status: DC

## 2016-08-29 NOTE — Progress Notes (Signed)
Cardiology Office Note   Date:  08/29/2016   ID:  Jamie AbbottCarolyn Hoey, DOB 02/04/1951, MRN [id number]  PCP:  Frazier RichardsIXON,MARY BETH, PA-C  Cardiologist:  Dr. Antoine PocheHochrein 10/19/2015 Abelino DerrickLuke K Kilroy, PA-C, 03/12/2016  Theodore DemarkBarrett, Jahniah Pallas, PA-C   Chief Complaint  Patient presents with  . Follow-up    6 months;   . Shortness of Breath    occasionally.    History of Present Illness: [name] is a 66 y.o. female with a history of LIMA-LAD in 1996, L CCA-subclavian bpg 2008, HLD, HTN, tob use, PAD, left nephrectomy, CVA, COPD. Doppler studies in 2017 showed mild carotid narrowing.  Jamie AbbottCarolyn Eagen presents for cardiology follow up.   She is still working and does ok with that. She has cut back, but not completely quit. She is down to 1/2 pack/day. She has been smoking less because the weather is cold. She is willing to join a gym, feels that she needs to tone up. No LE edema, no orthopnea or PND.   She gets upper mid abd pain after she eats. She does not take anything every day, has Prilosec which she takes as needed. She is drinking warm ginger ale and eating cucumbers, feels these are helping her symptoms.   She does not drink dark-colored sodas, coffee or tea because of her kidneys.   Lipids are followed by Guss BundeMaryBeth Dixon, PA-C.   She is not surprised that her BP is high, she says that she is a little hyper today, she gets that way with MD appts. She does not know her BP at home, but can tell when it is up. She will see lines in her vision. If she gets that at work, she will take 1/2 nerve pill, calm down and the symptoms improve.    Past Medical History:  Diagnosis Date  . Allergy   . COPD (chronic obstructive pulmonary disease) (HCC)   . Coronary artery disease 05/16/1995   Cathed in November, 05/20/1995--CABG x 1-----LIMA to LAD  . Hyperlipidemia   . Hypertension   . Myocardial infarction   . Nephrolithiasis    Left Nephrectomy   . Smoker   . Stroke [facility](HCC)     Past Surgical  History:  Procedure Laterality Date  . CAROTID ARTERY - SUBCLAVIAN ARTERY BYPASS GRAFT Left 04/18/2007  . CORONARY ARTERY BYPASS GRAFT    . KIDNEY SURGERY Left    66 yo  . Left Nephrectomy Left 10/17/1983   Dr. Sural---StagHornCalculus, Left Kidney Nonfunctioning wiht Acute and Chronic Pyelonephritis    Current Outpatient Prescriptions  Medication Sig Dispense Refill  . alendronate (FOSAMAX) 70 MG tablet TAKE 1 TABLET BY MOUTH ONCE WEEKLY TAKE WITH A FULL GLASS OF WATER ON AN EMPTY STOMACH 12 tablet 3  . ALPRAZolam (XANAX) 0.25 MG tablet TAKE 1 TABLET EVERY DAY AS NEEDED 30 tablet 2  . aspirin 81 MG tablet Take 81 mg by mouth daily.    . Cholecalciferol (VITAMIN D3) 2000 units capsule Take 1 capsule (2,000 Units total) by mouth daily.    Marland Kitchen. lisinopril (PRINIVIL,ZESTRIL) 40 MG tablet Take 1 tablet (40 mg total) by mouth daily. 90 tablet 1  . nitroGLYCERIN (NITROSTAT) 0.4 MG SL tablet PLACE 1 TABLET ON THE TONGUE &LET DISSOLVE EVERY 5 MINS AS NEEDED FOR CHEST PAIN 25 tablet 0  . simvastatin (ZOCOR) 40 MG tablet TAKE 1 TABLET BY MOUTH EVERY DAY 90 tablet 1  . triamcinolone cream (KENALOG) 0.1 % APPLY 1 APPLICATION TOPICALLY 2 (TWO) TIMES DAILY. 30 g  0   No current facility-administered medications for this visit.     Allergies:   Patient has no known allergies.    Social History:  The patient  reports that she has been smoking Cigarettes.  She has a 10.00 pack-year smoking history. She has never used smokeless tobacco. She reports that she does not use drugs.   Family History:  The patient's family history includes COPD in her brother and father; Cancer in her sister and sister; Depression in her sister.    ROS:  Please see the history of present illness. All other systems are reviewed and negative.    PHYSICAL EXAM: VS:  BP (!) 154/76   Pulse 88   Ht 5\' 7"  (1.702 m)   Wt 97 lb (44 kg)   BMI 15.19 kg/m  , BMI Body mass index is 15.19 kg/m. GEN: Well nourished, well developed,  female in no acute distress  HEENT: normal for age  Neck: no JVD, R>L carotid bruit, no masses Cardiac: RRR; soft murmur, no rubs, or gallops Respiratory: Decreased breath sounds bases bilaterally, slight and-expiratory wheeze, normal work of breathing GI: soft, nontender, nondistended, + BS MS: no deformity or atrophy; no edema; distal pulses are 2+ in RUE, decreased in both lower extremities, abdominal bruit and subclavian bruit noted   Skin: warm and dry, no rash Neuro:  Strength and sensation are intact Psych: euthymic mood, full affect   EKG:  EKG is not ordered today.   Recent Labs: 04/25/2016: ALT 7 05/13/2016: BUN 21; Creat 1.31; Potassium 5.1; Sodium 139    Lipid Panel    Component Value Date/Time   CHOL 164 04/25/2016 1444   TRIG 66 04/25/2016 1444   HDL 54 04/25/2016 1444   CHOLHDL 3.0 04/25/2016 1444   VLDL 13 04/25/2016 1444   LDLCALC 97 04/25/2016 1444     Wt Readings from Last 3 Encounters:  08/29/16 97 lb (44 kg)  05/13/16 97 lb (44 kg)  04/25/16 98 lb (44.5 kg)     Other studies Reviewed: Additional studies/ records that were reviewed today include: Office notes, hospital records and testing.  ASSESSMENT AND PLAN:  1.  PAD: She is not having claudication symptoms. However, she has not had ABIs and she also has an abdominal bruit. I we will check an abdominal ultrasound to evaluate this. She is very slender her at [place] aorta may be completely normal. She is not having claudication symptoms and is active at work without any difficulty. Continue to follow symptomatically.  She is encouraged to continue to work on smoking cessation.  2. Hypertension: She gets symptomatic at times from high blood pressure but doesn't know how high it is. She believes that her blood pressure is not that bad but doesn't follow.   Current medicines are reviewed at length with the patient today.  The patient does not have concerns regarding medicines.  The following  changes have been made:  Add amlodipine  Labs/ tests ordered today include:   abdominal ultrasound, ABIs   Disposition:   FU with Dr. Antoine Poche  Signed, Theodore Demark, PA-C  08/29/2016 5:11 PM    [facility] Phone: [phone]; Fax: [phone]  This note was written with the assistance of speech recognition software. Please excuse any transcriptional errors.

## 2016-08-29 NOTE — Patient Instructions (Signed)
Medication Instructions:  START AMLODIPINE 5 MG DAILY  If you need a refill on your cardiac medications before your next appointment, please call your pharmacy.  Labwork: NONE  Testing/Procedures: Your physician has requested that you have an ankle brachial index (ABI). During this test an ultrasound and blood pressure cuff are used to evaluate the arteries that supply the arms and legs with blood. Allow thirty minutes for this exam. There are no restrictions or special instructions.  Your physician has requested that you have an abdominal aorta duplex. During this test, an ultrasound is used to evaluate the aorta. Allow 30 minutes for this exam. Do not eat after midnight the day before and avoid carbonated beverages   Follow-Up: Your physician wants you to follow-up in: [facility]     Thank you for choosing [facility]!!    Michelle, LPN

## 2016-08-30 ENCOUNTER — Telehealth: Payer: Self-pay | Admitting: Physician Assistant

## 2016-08-30 NOTE — Telephone Encounter (Signed)
No additional recommendation at this time.   Monitor for myalgias or arthralgias.  If patient developes pain, may change simvastatin to crestor (rosuvastatin) after a washout period or 2-3 weeks.

## 2016-08-30 NOTE — Telephone Encounter (Signed)
Returned call to [facility]. Pharmacist has warning regarding simvastatin dose w/amlodipine. She states it is not recommended to be on amlodipine with more than 20mg  of simvastatin d/t increased risk for myalgias or rhabdomyolysis. Advised that amlodipine was a new medication Rx'ed 08/29/16 by cardiology PA so she is aware of potential interaction. Advised OK to dispense and will follow up with PA to see if any labs should be ordered to monitor for this potential side effect.   Message routed to [place] Silver[place], PA/pharmacy staff for med review

## 2016-08-30 NOTE — Telephone Encounter (Signed)
Please call,needs to know if pt have been on Amlodipine?Pt is on Simvastatin and if she is on Amlodipine this could cause a problem.

## 2016-08-30 NOTE — Telephone Encounter (Signed)
No further action needed. Should patient call w/complaints, recommendations per pharmacy staff to be followed.

## 2016-10-01 ENCOUNTER — Encounter: Payer: Self-pay | Admitting: Gastroenterology

## 2016-10-03 ENCOUNTER — Encounter: Payer: Self-pay | Admitting: Cardiology

## 2016-10-08 ENCOUNTER — Other Ambulatory Visit: Payer: Self-pay | Admitting: Physician Assistant

## 2016-10-08 DIAGNOSIS — R0989 Other specified symptoms and signs involving the circulatory and respiratory systems: Secondary | ICD-10-CM

## 2016-10-11 ENCOUNTER — Other Ambulatory Visit: Payer: Self-pay | Admitting: Physician Assistant

## 2016-10-11 DIAGNOSIS — R0989 Other specified symptoms and signs involving the circulatory and respiratory systems: Secondary | ICD-10-CM

## 2016-10-16 ENCOUNTER — Ambulatory Visit (HOSPITAL_COMMUNITY)
Admission: RE | Admit: 2016-10-16 | Discharge: 2016-10-16 | Disposition: A | Discharge status: Home / Self Care | Payer: BLUE CROSS/BLUE SHIELD | Source: Ambulatory Visit | Attending: Cardiology | Admitting: Cardiology

## 2016-10-16 DIAGNOSIS — I700 Atherosclerosis of aorta: Secondary | ICD-10-CM | POA: Insufficient documentation

## 2016-10-16 DIAGNOSIS — I708 Atherosclerosis of other arteries: Secondary | ICD-10-CM | POA: Insufficient documentation

## 2016-10-16 DIAGNOSIS — I743 Embolism and thrombosis of arteries of the lower extremities: Secondary | ICD-10-CM | POA: Insufficient documentation

## 2016-10-16 DIAGNOSIS — I739 Peripheral vascular disease, unspecified: Secondary | ICD-10-CM | POA: Insufficient documentation

## 2016-10-16 DIAGNOSIS — R0989 Other specified symptoms and signs involving the circulatory and respiratory systems: Secondary | ICD-10-CM

## 2016-10-17 ENCOUNTER — Telehealth: Payer: Self-pay | Admitting: *Deleted

## 2016-10-17 NOTE — Telephone Encounter (Signed)
Left msg to call.

## 2016-10-17 NOTE — Telephone Encounter (Signed)
Returned call, no answer when dialed. 

## 2016-10-17 NOTE — Telephone Encounter (Signed)
-----   Message from Ellsworth LennoxBrittany M Strader, [place]PA-C sent at 10/17/2016  9:24 AM EDT ----- Covering for Bjorn Loserhonda - Please let the patient know her doppler studies showed 50-74% right mid SFA stenosis and 50-74% left mid SFA disease along with >50% bilateral common and right external iliac artery stenosis. In reviewing Rhonda's note she was not having claudication symptoms, therefore an immediate referral for PV consult is not necessary. Keep follow-up with Dr. Antoine PocheHochrein on 10/24/2016 to further discuss the results and next steps. Continue ASA and statin therapy. Thank you.

## 2016-10-17 NOTE — Telephone Encounter (Signed)
[facility]

## 2016-10-22 NOTE — Telephone Encounter (Signed)
Left msg to call.

## 2016-10-23 NOTE — Progress Notes (Signed)
Cardiology Office Note   Date:  10/24/2016   ID:  Ajaya Crutchfield, DOB 1951/03/06, MRN [id number]  PCP:  Dorena Bodo, PA-C  Cardiologist:   Rollene Rotunda, MD   Chief Complaint  Patient presents with  . Coronary Artery Disease      History of Present Illness: [name] is a 66 y.o. female who presents for evaluation of known coronary disease. She has a history of CABG in 1996. 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   I sent her for a POET (Plain Old Exercise Treadmill) 2016,  This was negative for any evidence of ischemia. However, she had a hypertensive blood pressure response.   At the last visit she had a lower extremity Doppler and she was found to have  50-74% right mid SFA stenosis and 50-74% left mid SFA disease along with >50% bilateral common and right external iliac artery stenosis.   No further therapy was planned.  She apparently had some high blood pressures treated by her primary care doctor. Was started on Norvasc. She said she had weeks of chest discomfort at least a month ago there was sharp. It was not like previous angina and subsequently went away. She's not very active at work and is not getting any chest pain. She does have some shortness of breath if she moves up and down a ladder very quickly. She still smoking cigarettes.  Past Medical History:  Diagnosis Date  . Allergy   . COPD (chronic obstructive pulmonary disease) (HCC)   . Coronary artery disease 05/16/1995   Cathed in November, 05/20/1995--CABG x 1-----LIMA to LAD  . Hyperlipidemia   . Hypertension   . Myocardial infarction (HCC)   . Nephrolithiasis    Left Nephrectomy   . Smoker   . Stroke [facility])     Past Surgical History:  Procedure Laterality Date  . CAROTID ARTERY - SUBCLAVIAN ARTERY BYPASS GRAFT Left 04/18/2007  . CORONARY ARTERY BYPASS GRAFT    . KIDNEY SURGERY Left    66 yo  . Left Nephrectomy Left 10/17/1983   Dr. Sural---StagHornCalculus,  Left Kidney Nonfunctioning wiht Acute and Chronic Pyelonephritis     Current Outpatient Prescriptions  Medication Sig Dispense Refill  . alendronate (FOSAMAX) 70 MG tablet TAKE 1 TABLET BY MOUTH ONCE WEEKLY TAKE WITH A FULL GLASS OF WATER ON AN EMPTY STOMACH 12 tablet 3  . ALPRAZolam (XANAX) 0.25 MG tablet TAKE 1 TABLET EVERY DAY AS NEEDED 30 tablet 2  . amLODipine (NORVASC) 5 MG tablet Take 1 tablet (5 mg total) by mouth daily. 180 tablet 3  . aspirin 81 MG tablet Take 81 mg by mouth daily.    . Cholecalciferol (VITAMIN D3) 2000 units capsule Take 1 capsule (2,000 Units total) by mouth daily.    Marland Kitchen lisinopril (PRINIVIL,ZESTRIL) 40 MG tablet Take 1 tablet (40 mg total) by mouth daily. 90 tablet 1  . nitroGLYCERIN (NITROSTAT) 0.4 MG SL tablet PLACE 1 TABLET ON THE TONGUE &LET DISSOLVE EVERY 5 MINS AS NEEDED FOR CHEST PAIN 25 tablet 0  . triamcinolone cream (KENALOG) 0.1 % APPLY 1 APPLICATION TOPICALLY 2 (TWO) TIMES DAILY. 30 g 0  . atorvastatin (LIPITOR) 40 MG tablet Take 1 tablet (40 mg total) by mouth daily. 90 tablet 3   No current facility-administered medications for this visit.     Allergies:   Patient has no known allergies.    ROS:  Please see the history of present  illness.   Otherwise, review of systems are positive for none.   All other systems are reviewed and negative.    PHYSICAL EXAM: VS:  BP 118/66   Pulse 100   Ht 5\' 2"  (1.575 m)   Wt 94 lb (42.6 kg)   BMI 17.19 kg/m  , BMI Body mass index is 17.19 kg/m. GENERAL:  Well appearing, very thin NECK:  No jugular venous distention, waveform within normal limits, carotid upstroke brisk and symmetric, no bruits, no thyromegaly LUNGS:  Clear to auscultation bilaterally BACK:  No CVA tenderness CHEST:  Unremarkable HEART:  PMI not displaced or sustained,S1 and S2 within normal limits, no S3, no S4, no clicks, no rubs, no murmurs ABD:  Flat, positive bowel sounds normal in frequency in pitch, positive bruits, no rebound, no  guarding, no midline pulsatile mass, no hepatomegaly, no splenomegaly EXT:  2 plus pulses throughout, no edema, no cyanosis no clubbing    EKG:  EKG is not  ordered today.    Recent Labs: 04/25/2016: ALT 7 05/13/2016: BUN 21; Creat 1.31; Potassium 5.1; Sodium 139    Lipid Panel    Component Value Date/Time   CHOL 164 04/25/2016 1444   TRIG 66 04/25/2016 1444   HDL 54 04/25/2016 1444   CHOLHDL 3.0 04/25/2016 1444   VLDL 13 04/25/2016 1444   LDLCALC 97 04/25/2016 1444      Wt Readings from Last 3 Encounters:  10/24/16 94 lb (42.6 kg)  08/29/16 97 lb (44 kg)  05/13/16 97 lb (44 kg)      Other studies Reviewed: Additional studies/ records that were reviewed today include:  Labs     ASSESSMENT AND PLAN:  CAD:    The patient had negative test for ischemia in 2016  Chest pain she was having a month or so has resolved and was very atypical.   She will continue with risk reduction.   No further testing is indicated.   TOBACCO:    We talked about this again today.   She is cutting back.    DYSLIPIDEMIA:    Her cholesterol is not at target.  I will change to Lipitor 40 mg daily and repeat a lipid profile in 8 weeks.   Stop Zocor. Lab Results  Component Value Date   CHOL 164 04/25/2016   TRIG 66 04/25/2016   HDL 54 04/25/2016   LDLCALC 97 04/25/2016   PVD:  She has disease as above.  She needs continued risk reduction.  No change in therapy is planned.  She needs to stop smoking  HTN:   Elmarie ShileyOThere was a slight differential in her blood pressures today between her 2 arms but they were both well controlled. No change in therapy is indicated.  Current medicines are reviewed at length with the patient today.  The patient does not have concerns regarding medicines.  The following changes have been made:  As above  Labs/ tests ordered today include:   Orders Placed This Encounter  Procedures  . Lipid Profile  . Hepatic function panel     Disposition:   FU with in me in  one year.    Signed, Rollene RotundaJames Rohit Deloria, MD  10/24/2016 6:26 PM    [facility]

## 2016-10-24 ENCOUNTER — Ambulatory Visit (INDEPENDENT_AMBULATORY_CARE_PROVIDER_SITE_OTHER): Payer: BLUE CROSS/BLUE SHIELD | Admitting: Cardiology

## 2016-10-24 ENCOUNTER — Encounter: Payer: Self-pay | Admitting: Cardiology

## 2016-10-24 VITALS — BP 118/66 | HR 100 | Ht 62.0 in | Wt 94.0 lb

## 2016-10-24 DIAGNOSIS — I2510 Atherosclerotic heart disease of native coronary artery without angina pectoris: Secondary | ICD-10-CM | POA: Diagnosis not present

## 2016-10-24 DIAGNOSIS — E785 Hyperlipidemia, unspecified: Secondary | ICD-10-CM | POA: Diagnosis not present

## 2016-10-24 DIAGNOSIS — Z9861 Coronary angioplasty status: Secondary | ICD-10-CM

## 2016-10-24 DIAGNOSIS — Z79899 Other long term (current) drug therapy: Secondary | ICD-10-CM | POA: Diagnosis not present

## 2016-10-24 DIAGNOSIS — I739 Peripheral vascular disease, unspecified: Secondary | ICD-10-CM | POA: Diagnosis not present

## 2016-10-24 DIAGNOSIS — I10 Essential (primary) hypertension: Secondary | ICD-10-CM | POA: Diagnosis not present

## 2016-10-24 DIAGNOSIS — Z720 Tobacco use: Secondary | ICD-10-CM

## 2016-10-24 MED ORDER — ATORVASTATIN CALCIUM 40 MG PO TABS: 40.0000 mg | ORAL_TABLET | Freq: Every day | ORAL | 3 refills | Status: DC

## 2016-10-24 NOTE — Patient Instructions (Signed)
Medication Instructions:  STOP- Simvastatin START- Atorvastatin 40 mg daily  Labwork: Fasting Lipid Liver in 8 weeks  Testing/Procedures: None Ordered  Follow-Up: Your physician wants you to follow-up in: 1 Year. You will receive a reminder letter in the mail two months in advance. If you don't receive a letter, please call our office to schedule the follow-up appointment.   Any Other Special Instructions Will Be Listed Below (If Applicable).   If you need a refill on your cardiac medications before your next appointment, please call your pharmacy.

## 2016-10-24 NOTE — Telephone Encounter (Signed)
Have been unable to reach patient, multiple msgs left. Pt is scheduled to see Dr. Antoine PocheHochrein today to discuss results.

## 2016-10-25 ENCOUNTER — Other Ambulatory Visit: Payer: Self-pay | Admitting: Cardiology

## 2016-11-13 ENCOUNTER — Ambulatory Visit (INDEPENDENT_AMBULATORY_CARE_PROVIDER_SITE_OTHER): Payer: BLUE CROSS/BLUE SHIELD | Admitting: Physician Assistant

## 2016-11-13 ENCOUNTER — Encounter: Payer: Self-pay | Admitting: Physician Assistant

## 2016-11-13 VITALS — BP 110/60 | HR 86 | Temp 98.3°F | Resp 16 | Wt 100.0 lb

## 2016-11-13 DIAGNOSIS — I739 Peripheral vascular disease, unspecified: Secondary | ICD-10-CM

## 2016-11-13 DIAGNOSIS — Z951 Presence of aortocoronary bypass graft: Secondary | ICD-10-CM

## 2016-11-13 DIAGNOSIS — L409 Psoriasis, unspecified: Secondary | ICD-10-CM

## 2016-11-13 DIAGNOSIS — I2510 Atherosclerotic heart disease of native coronary artery without angina pectoris: Secondary | ICD-10-CM

## 2016-11-13 DIAGNOSIS — I771 Stricture of artery: Secondary | ICD-10-CM | POA: Diagnosis not present

## 2016-11-13 DIAGNOSIS — M810 Age-related osteoporosis without current pathological fracture: Secondary | ICD-10-CM

## 2016-11-13 DIAGNOSIS — J439 Emphysema, unspecified: Secondary | ICD-10-CM

## 2016-11-13 DIAGNOSIS — F17200 Nicotine dependence, unspecified, uncomplicated: Secondary | ICD-10-CM

## 2016-11-13 DIAGNOSIS — E785 Hyperlipidemia, unspecified: Secondary | ICD-10-CM | POA: Diagnosis not present

## 2016-11-13 DIAGNOSIS — I10 Essential (primary) hypertension: Secondary | ICD-10-CM

## 2016-11-13 DIAGNOSIS — E559 Vitamin D deficiency, unspecified: Secondary | ICD-10-CM

## 2016-11-13 NOTE — Progress Notes (Signed)
Patient ID: Jamie AbbottCarolyn Widger MRN: [id number], DOB: 03/11/1951, 66 y.o. Date of Encounter: @DATE @  Chief Complaint:  Chief Complaint  Patient presents with  . Routine Follow-up OV        HPI: 66 y.o. year old white female  Presents for f/u OV.   THE FOLLOWING IS COPIED FROM HER OV NOTE WITH ME 01/30/2015:    01/30/2015: I REVIEWED HER PAPER CHART AND ABSTRACTED ALL IMPORTANT INFORMATION INTO EPIC  She presents to re-establish care. Her last office visit here in this office was 07/06/2010. She reports that since then, she has had no follow-up and no medical care. She says that any time she is around her friends-- they seem to have all types of aches and pains and problems-- and that she felt that she was in much better shape than any of them so she felt that she really did not need to be coming in. Says that because of her bypass surgery and having a kidney removed in the past, that she has been eating a very healthy diet because of the instructions she was given at the time of those surgeries. Says that she has continued to follow the diet given back then. Says that she has a stationary bike and she actually uses hers!! Says that she mows her grass using a riding mower as well as a push mower. Says that she has continued all of these habits and is very active and has been feeling fine so she felt that she could just go without coming into a doctor's office.  However, she recently decided that she probably better come in and get checked just to make sure.  States that she has been having no angina type symptoms. States that she has been feeling well.   AT THAT INITIAL OV ON 01/30/2015: Checked Full Panel of Labs Ordered Referral to Cardiology Gave Tdap, Pneumovax 23 Discussed her returning for CPE and she was agreeable.  I reviewed her paper chart and abstracted information into Epic   02/13/2015---- She presented for CPE. Had no complaints or concerns to address.    05/29/2015: She states that she saw Cardiology for office visit. Says that they had her scheduled to perform an exercise test and her blood pressure went up during the test so she has an appointment to see him this Friday for follow-up. Reviewed her chart. She had a visit with Dr. Antoine PocheHochrein on 03/01/15. At that time the plan was for her to have an exercise test and follow-up with him one year. However, she does have an appointment with him this coming Friday.  Today she reports that she has decreased smoking and has decreased to about 1/2 pack a day. Says that she is "doing good on that ".  States that she is taking her simvastatin as directed. No myalgias or other adverse effects.  States that she is taking the vitamin D 2000 units daily as directed. States that she is taking the Fosamax once weekly as directed and is taking it every Sunday. She has no specific complaints or concerns today.  10/23/2015: Reviewed her OV note with Dr. Antoine PocheHochrein 10/20/2015--he increased lisinopril to 20mg . Told her to check BMET 10 days. She has lab slip with her today. Says he told her she could have lab drawn here but told her it needs to be done 10days after increasing lisinopril.  She says she has decreased smoking to 10 cigarettes per day.  Says she missed Urology appt. Says she still has  their paperwork at home and she will call to schedule a new appt.  Is taking statin. No myalgias or other adv effects.  She is taking Fosamax. No jaw pain or thigh pain. Taking Vit D.  No complaints or concerns today.    04/25/2016: She has rash on anterior aspect of her ankle and rash on forehead at hairline. Has used hydrocortisone cream--says it gets a little better when she uses that but it doesn't clear up completely. She had OV at Cardiology 02/2016 with Corine Shelter. She had f/u carotid dopplers 03/22/16 which did not show any high-grade stensois.  I reviewed that at last lab with me--11/08/15--had said to increase  simvastatin to 40mg  and recheck labs 6 weeks. She never had f/u labs. She is fasting today. She is taking the Simvastatin 40mg . No myalgias or other adv effects.  She is taking Fosamax as directed. No jaw pain, no femur pain.  She is taking BP meds as directed. No lightheadedness or other adv effects.  No other concerns to address today.   05/13/2016: At OV 04/25/16 I increased the dose of lisinopril from 20 mg to 40 mg. she was to follow-up in 2 weeks to recheck blood pressure and bmet. Today she reports that she is taking the increased dose of lisinopril 40 mg. She is having no adverse effects. Also states that the rash that we treated at the last visit is improved. Rash that was at her hairline/4 head has resolved and the one on the anterior aspect of her ankle is improving.  11/13/2016: Today she discusses that she has had visits at the cardiology office and I have reviewed those notes. Reviewed that on 08/29/16 her blood pressure was elevated and Norvasc was added. Reviewed that she had visit with Dr. Antoine Poche on 10/24/16. At that visit he changed  Zocor to Lipitor. She is to return 8 weeks later for FLP LFT. Patient states that she did add the Norvasc and has changed the simvastatin to atorvastatin. She states that the rash on the front of her left ankle has never resolved. Says that even when she puts the prescription medication on it, it may get some better but never clears up. She has no other concerns to address today. She is still smoking some and says that Dr. Antoine Poche "got on her  about that"  and told her that if she was still smoking at the next visit he would add medication.  She states that he "also got on her about the need for routine exercise" and that she is going to join a gym and that "between work and working out at [employer] she won't have time to smoke." I asked today if she feels that she does need medicine to help her and offered to prescribe this but she says that she is  going to go to the gym and says that she will quit.    Past Medical History/Problem List:   1. Atherosclerosis of native coronary artery of native heart without angina pectoris  2. History of coronary artery bypass surgery 05/20/1995 she underwent coronary artery bypass grafting by Dr. Particia Lather with a LIMA to the LAD. Cardiac catheterization had revealed 95% proximal LAD lesion too close to the left main for endovascular interventional treatment.  CABG was indicated after a positive stress test, for relief of symptoms, and for myocardial preservation.  3. Subclavian artery stenosis, left She had complained of some mild chest discomfort so underwent a Cardiolite scan 02/23/2007. This revealed  evidence of mild anteroseptal ischemia with normal EF. She was also noted to have unequal upper extremity blood pressures on her initial visit with Dr. Jenne Campus with blood pressure 120/70 in the right arm and 90/60 in the left arm. This raised suspicion for left subclavian stenosis.  She underwent left subclavian ultrasound 03/05/2007. Revealed widely patent right and left internal carotids. There were abnormal waveform in the left subclavian suggesting > 50% stenosis. It was felt that she had probable left subclavian stenosis which was concerning that may be compromising her LIMA to her LAD given her abnormal Cardiolite scan. She underwent cardiac catheterization 03/27/2007 revealing 50% proximal LAD lesion. The IMA was visualized with 70% lesion in the IMA prior to the insertion site. The ramus and OM had no significant disease. There was 60% lesion in the ostial portion of the RCA. This showed to have 99% ostial left subclavian stenosis with calcification and TIMI 1 flow into the left subclavian. She was scheduled to undergo left subclavian angiogram with possible PTA/stenting by Dr. Allyson Sabal on 04/13/2007. However, failed percutaneous revascularization.  Had left carotid -  subclavian bypass in  2008.  4. S/P vascular bypass ------ left carotid -  subclavian bypass in 2008.  5. Hyperlipidemia When she was seeing me in the past she was on Crestor 20 mg daily.  6. Smoker  7. Vitamin D deficiency  8. Osteoporosis DEXA scan 03/07/2010 showed: Lumbar spine----- T score -2.5 Left femur---------- T score -1.6 Patient's diagnostic category was osteoporosis by criteria. At that time, she was prescribed Fosamax 70 mg 1 every week. She was also to take 1200 mg calcium daily. August 2011 vitamin D level had been low and she was given prescription strength vitamin D and was to take this.    Home Meds: Outpatient Medications Prior to Visit  Medication Sig Dispense Refill  . alendronate (FOSAMAX) 70 MG tablet TAKE 1 TABLET BY MOUTH ONCE WEEKLY TAKE WITH A FULL GLASS OF WATER ON AN EMPTY STOMACH 12 tablet 3  . ALPRAZolam (XANAX) 0.25 MG tablet TAKE 1 TABLET EVERY DAY AS NEEDED 30 tablet 2  . amLODipine (NORVASC) 5 MG tablet Take 1 tablet (5 mg total) by mouth daily. 180 tablet 3  . aspirin 81 MG tablet Take 81 mg by mouth daily.    . Cholecalciferol (VITAMIN D3) 2000 units capsule Take 1 capsule (2,000 Units total) by mouth daily.    . nitroGLYCERIN (NITROSTAT) 0.4 MG SL tablet PLACE 1 TABLET ON THE TONGUE &LET DISSOLVE EVERY 5 MINS AS NEEDED FOR CHEST PAIN 25 tablet 0  . triamcinolone cream (KENALOG) 0.1 % APPLY 1 APPLICATION TOPICALLY 2 (TWO) TIMES DAILY. 30 g 0  . atorvastatin (LIPITOR) 40 MG tablet Take 1 tablet (40 mg total) by mouth daily. 90 tablet 3  . lisinopril (PRINIVIL,ZESTRIL) 20 MG tablet TAKE 1 TABLET (10 MG TOTAL) BY MOUTH DAILY. 90 tablet 3   No facility-administered medications prior to visit.      Allergies: No Known Allergies  Social History   Social History  . Marital status: Married    Spouse name: N/A  . Number of children: 2  . Years of education: N/A   Occupational History  . Not on file.   Social History Main Topics  . Smoking status: Current Every  Day Smoker    Packs/day: 0.25    Years: 40.00    Types: Cigarettes  . Smokeless tobacco: Never Used  . Alcohol use No  . Drug use: No  .  Sexual activity: Yes   Other Topics Concern  . Not on file   Social History Narrative   Lives alone.      Family History  Problem Relation Age of Onset  . Depression Sister   . COPD Father   . Cancer Sister        hysterectomy sec to cancer  . Cancer Sister        cervical cancer  . COPD Brother      Review of Systems:  See HPI for pertinent ROS. All other ROS negative.    Physical Exam: Blood pressure 110/60, pulse 86, temperature 98.3 F (36.8 C), temperature source Oral, resp. rate 16, weight 100 lb (45.4 kg), SpO2 96 %., Body mass index is 18.29 kg/m. General: Thin, WNWD WF. Appears in no acute distress. Neck: Supple. No thyromegaly. No lymphadenopathy. She has carotid bruits bilaterally. She has a soft left subclavian bruit as well. Lungs: Clear bilaterally to auscultation without wheezes, rales, or rhonchi. Breathing is unlabored. Heart: RRR with S1 S2. No murmurs, rubs, or gallops. Abdomen: Soft, non-tender, non-distended with normoactive bowel sounds. No hepatomegaly. No rebound/guarding. No obvious abdominal masses. Musculoskeletal:  Strength and tone normal for age. Extremities/Skin: Anterior aspect of ankle: she has well-demarcated patch-- ~1 inch oval shape--thick scale Neuro: Alert and oriented X 3. Moves all extremities spontaneously. Gait is normal. CNII-XII grossly in tact. Psych:  Responds to questions appropriately with a normal affect.     ASSESSMENT AND PLAN:  66 y.o. year old female with   Psoriasis 11/13/2016---Will Refer to Derm - Ambulatory referral to Dermatology  HTN 04/25/2016: BP slightly elevated at LOV here and at LOV at Cardiology. She says she didn't eat anything today b/c she "didnt want to eat  a hotdog or somehting that woulld make her blood pressure be up"  Will increase Lisinopril from 20mg   to 40mg .   Will have her return for f/u OV 2 weeks to recheck BP and BMET 05/13/2016: She is taking the lisinopril 40 mg now. She is having no adverse effects. Blood pressure is now at goal. Will recheck BMP T on new dose. 11/13/2016--Blood pressure is at goal. She has had recent labs at cardiology to monitor.     Hyperlipidemia 04/25/2016: Increased Simva to 40mg  10/2015. Never had f/u labs. Recheck now.  11/13/2016--10/24/16 cardiology changed simvastatin to Lipitor 40. She is to have follow-up labs 8 weeks later.  Atherosclerosis of native coronary artery of native heart without angina pectoris 04/25/2016: At visit 01/2015 I ordered referral back to Cardiology. Cardiology is now managing this. 11/13/2016--managed by cardiology   History of coronary artery bypass surgery 04/25/2016:-At visit 01/2015 I ordered referral back to Cardiology. Cardiology is now managing this. 11/13/2016--managed by cardiology    Subclavian artery stenosis, left 04/25/2016:At visit 01/2015 I ordered referral back to Cardiology. Cardiology is now managing this. 11/13/2016--managed by cardiology   S/P vascular bypass 04/25/2016:At visit 01/2015 I ordered referral back to Cardiology. Cardiology is now managing this. 11/13/2016--managed by cardiology   Smoker At OV 02/13/2015--Discussed Medication for Cessation She says she wants to work on "cutting back on her own first"----"if that doesn't work, then would try medication to help with this" At OV 05/29/15 she says she has decreased to 1/2 ppd and says she's "doing good on that". At OV 10/23/2015---says smoking decreased to about 10 cigarettes a day.  At OV 04/25/2016--says still at 10 cigarettes per day 11/13/2016---Discussed medication to help her with smoking cessation. She states that she is  going to join a gym in between work and being at the gym she won't have time to smoke  Vitamin D deficiency At OV 01/30/2015--Checked Vitamin D Level---Was low at 21---At f/u OV she  confirms that she IS taking the otc Vitamin D 2,000 IU as directed At OV 10/23/2015---future lab ordered to recheck Vit D when she returns for FLP/CMET  Osteoporosis At OV 01/30/2015--Checked Vitamin D Level---Was low at 21---At f/u OV she confirms that she IS taking the otc Vitamin D 2,000 IU as directed At CPE 02/13/15-- Ordered  Bone Density She is a smoker. --Review of Paper Chart: DEXA scan 03/07/2010 showed: Lumbar spine----- T score -2.5 Left femur---------- T score -1.6 Patient's diagnostic category was osteoporosis by criteria. At that time, she was prescribed Fosamax 70 mg 1 every week. She was also to take 1200 mg calcium daily. August 2011 vitamin D level had been low and she was given prescription strength vitamin D and was to take this. I reviewed her old paper chart. When she had DEXA 03/07/2010 this is when Fosamax was prescribed. However she then stopped follow-up visits/meds. At most, she took Fosamax for 12 months, beginning 02/2010. Her most recent DEXA scan was performed 03/30/15. T-scores were -1.7 and -2.3. At that time she was told to start Fosamax 70 mg once a week. She has already started on vitamin D supplement and calcium. 04/25/2016: Cont Fosamax, Calcium, Vit D 11/13/2016--continue Fosamax for 5 years--- she will complete this 2021. Continue calcium and vitamin D.   Solitary kidney, acquired At visit 05/29/15 reviewed the need for follow-up with urology. She is now agreeable for me to order referral. Noted that she has seen Dr. Mickel Crowon Sural in the past and that if he is still available, to schedule appointment with him or someone in his practice. - Ambulatory referral to Urology At OV 10/23/2015--I followed up this---she says she is going to reschedule the appt with Urology---she wasnot able to go to originally scheduled appt.    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 Dr. Etta GrandchildSural. Staghorn Calculus, Left Kidney Nonfunctioning, With Acute and Chronic  Pyelonephritis At visit 05/29/15 reviewed the need for follow-up with urology. She is now agreeable for me to order referral. Noted that she has seen Dr. Mickel Crowon Sural in the past and that if he is still available, to schedule appointment with him or someone in his practice. - Ambulatory referral to Urology At OV 10/23/2015--I followed up this---she says she is going to reschedule the appt with Urology---she wasnot able to go to originally scheduled appt.     THE FOLLOWING IS COPIED FROM HER CPE PERFORMED 03/30/2015: 1. Visit for preventive health examination  A. Screening Labs: 01/30/2015--Did Labs: CBC--------Normal CMET-----Normal FLP--------LDL--119--started Simvastatin 20mg ---At OV 02/13/15-she says she IS taking this TSH-------Normal Vit D------21--Recommended to start otc Vit D 2,000 IU QD---At OV 02/13/15- she says she IS taking this  B. Pap Smear: Pap Smear sent 02/13/2015 - PAP, Thin Prep w/HPV rflx HPV Type 16/18  C. Screening Mammogram: At CPE 02/13/2015---she is agreeable for me to schedule f/u Mammogram - MM Digital Screening; Future SHe had mammogram 03/31/15 ---negative  D. DEXA: At CPE 02/13/2015--she is agreeable for me to schedule f/u DEXA - DG Bone Density; Future See Note under "Osteoporosis" Above  E. Screening for Colorectal Cancer: Pt reports she has had one colonoscopy in past---was performed at [facility] I CHECKED EPIC---SEE NO MENTION OF THIS IN ENCOUNTERS, PROCEDURES, ETC I CHECKED PAPER CHART---CANNOT FIND COPY OF THIS AT  NEXT OV--WILL HAVE PT SIGN RELEASE TO GET RECORD/FIND OUT DATE OF LAST COLONOSCOPY --AT OV 05/29/15--- I did have her sign a release form to get copy of last colonoscopy from [facility]  F. Immunizations:  Influenza Vaccine--------------N/A---August------Given here 05/29/15 T dap-----------------------------Given here 01/30/2015 Pneumovax 23 -----------------Given here 01/30/2015. No further Pneumonia Vaccine indicated until age  59 Zostavax:   I Wrote this on her AVS 02/13/2015--to remind her--to call insurance regarding coverage, then call us and let us know.    Murray Hodgkins [place], [place], [facility] 11/13/2016 2:19 PM

## 2016-11-26 ENCOUNTER — Other Ambulatory Visit: Payer: Self-pay | Admitting: Physician Assistant

## 2016-11-26 NOTE — Telephone Encounter (Signed)
Last OV 11/13/16 Last refill 01/11/2016 Ok to refill?

## 2016-11-27 NOTE — Telephone Encounter (Signed)
Rx called in to pharmacy. 

## 2016-11-27 NOTE — Telephone Encounter (Signed)
Approved. # [id number] 0. 

## 2017-02-19 ENCOUNTER — Other Ambulatory Visit: Payer: Self-pay | Admitting: Physician Assistant

## 2017-02-19 NOTE — Telephone Encounter (Signed)
Approved. #[id number]. 

## 2017-02-19 NOTE — Telephone Encounter (Signed)
Ok to refill 

## 2017-04-21 ENCOUNTER — Other Ambulatory Visit: Payer: Self-pay | Admitting: Physician Assistant

## 2017-04-21 DIAGNOSIS — M810 Age-related osteoporosis without current pathological fracture: Secondary | ICD-10-CM

## 2017-05-12 ENCOUNTER — Ambulatory Visit (HOSPITAL_COMMUNITY): Admit: 2017-05-12 | Payer: BLUE CROSS/BLUE SHIELD | Admitting: Cardiovascular Disease

## 2017-05-12 ENCOUNTER — Other Ambulatory Visit: Payer: Self-pay

## 2017-05-12 ENCOUNTER — Inpatient Hospital Stay (HOSPITAL_COMMUNITY)
Admission: EM | Disposition: A | Discharge status: Home / Self Care | Payer: Self-pay | Source: Home / Self Care | Attending: Cardiovascular Disease

## 2017-05-12 ENCOUNTER — Encounter (HOSPITAL_COMMUNITY): Payer: Self-pay

## 2017-05-12 ENCOUNTER — Inpatient Hospital Stay (HOSPITAL_COMMUNITY)
Admission: EM | Admit: 2017-05-12 | Discharge: 2017-05-14 | DRG: 247 | Disposition: A | Discharge status: Home / Self Care | Payer: BLUE CROSS/BLUE SHIELD | Attending: Cardiovascular Disease | Admitting: Cardiovascular Disease

## 2017-05-12 ENCOUNTER — Encounter (HOSPITAL_COMMUNITY): Payer: Self-pay | Admitting: Emergency Medicine

## 2017-05-12 DIAGNOSIS — Z79899 Other long term (current) drug therapy: Secondary | ICD-10-CM

## 2017-05-12 DIAGNOSIS — I2510 Atherosclerotic heart disease of native coronary artery without angina pectoris: Secondary | ICD-10-CM | POA: Diagnosis present

## 2017-05-12 DIAGNOSIS — I2102 ST elevation (STEMI) myocardial infarction involving left anterior descending coronary artery: Secondary | ICD-10-CM | POA: Diagnosis present

## 2017-05-12 DIAGNOSIS — Z825 Family history of asthma and other chronic lower respiratory diseases: Secondary | ICD-10-CM | POA: Diagnosis not present

## 2017-05-12 DIAGNOSIS — E785 Hyperlipidemia, unspecified: Secondary | ICD-10-CM | POA: Diagnosis present

## 2017-05-12 DIAGNOSIS — Z7982 Long term (current) use of aspirin: Secondary | ICD-10-CM

## 2017-05-12 DIAGNOSIS — F17210 Nicotine dependence, cigarettes, uncomplicated: Secondary | ICD-10-CM | POA: Diagnosis present

## 2017-05-12 DIAGNOSIS — Z23 Encounter for immunization: Secondary | ICD-10-CM

## 2017-05-12 DIAGNOSIS — Z7983 Long term (current) use of bisphosphonates: Secondary | ICD-10-CM

## 2017-05-12 DIAGNOSIS — I10 Essential (primary) hypertension: Secondary | ICD-10-CM | POA: Diagnosis present

## 2017-05-12 DIAGNOSIS — Z720 Tobacco use: Secondary | ICD-10-CM | POA: Diagnosis not present

## 2017-05-12 DIAGNOSIS — R079 Chest pain, unspecified: Secondary | ICD-10-CM | POA: Diagnosis not present

## 2017-05-12 DIAGNOSIS — I2119 ST elevation (STEMI) myocardial infarction involving other coronary artery of inferior wall: Principal | ICD-10-CM | POA: Diagnosis present

## 2017-05-12 DIAGNOSIS — J449 Chronic obstructive pulmonary disease, unspecified: Secondary | ICD-10-CM | POA: Diagnosis present

## 2017-05-12 DIAGNOSIS — Z955 Presence of coronary angioplasty implant and graft: Secondary | ICD-10-CM

## 2017-05-12 DIAGNOSIS — E782 Mixed hyperlipidemia: Secondary | ICD-10-CM | POA: Diagnosis not present

## 2017-05-12 DIAGNOSIS — F17200 Nicotine dependence, unspecified, uncomplicated: Secondary | ICD-10-CM | POA: Diagnosis not present

## 2017-05-12 DIAGNOSIS — I4581 Long QT syndrome: Secondary | ICD-10-CM | POA: Diagnosis present

## 2017-05-12 DIAGNOSIS — I252 Old myocardial infarction: Secondary | ICD-10-CM

## 2017-05-12 HISTORY — PX: LEFT HEART CATH AND CORONARY ANGIOGRAPHY: CATH118249

## 2017-05-12 HISTORY — PX: CORONARY/GRAFT ACUTE MI REVASCULARIZATION: CATH118305

## 2017-05-12 LAB — LIPID PANEL
CHOLESTEROL: 142 mg/dL (ref 0–200)
HDL: 61 mg/dL (ref >40)
LDL CALC: 70 mg/dL (ref 0–99)
TRIGLYCERIDES: 56 mg/dL (ref <150)
Total CHOL/HDL Ratio: 2.3 RATIO
VLDL: 11 mg/dL (ref 0–40)

## 2017-05-12 LAB — COMPREHENSIVE METABOLIC PANEL
ALT: 12 U/L — ABNORMAL LOW (ref 14–54)
ANION GAP: 8 (ref 5–15)
AST: 39 U/L (ref 15–41)
Albumin: 3.4 g/dL — ABNORMAL LOW (ref 3.5–5.0)
Alkaline Phosphatase: 38 U/L (ref 38–126)
BUN: 16 mg/dL (ref 6–20)
CALCIUM: 9.1 mg/dL (ref 8.9–10.3)
CHLORIDE: 111 mmol/L (ref 101–111)
CO2: 20 mmol/L — AB (ref 22–32)
Creatinine, Ser: 0.91 mg/dL (ref 0.44–1.00)
Glucose, Bld: 94 mg/dL (ref 65–99)
Potassium: 3.8 mmol/L (ref 3.5–5.1)
SODIUM: 139 mmol/L (ref 135–145)
Total Bilirubin: 0.7 mg/dL (ref 0.3–1.2)
Total Protein: 5.5 g/dL — ABNORMAL LOW (ref 6.5–8.1)

## 2017-05-12 LAB — CBC
HCT: 39.7 % (ref 36.0–46.0)
Hemoglobin: 13 g/dL (ref 12.0–15.0)
MCH: 31.4 pg (ref 26.0–34.0)
MCHC: 32.7 g/dL (ref 30.0–36.0)
MCV: 95.9 fL (ref 78.0–100.0)
PLATELETS: 225 10*3/uL (ref 150–400)
RBC: 4.14 MIL/uL (ref 3.87–5.11)
RDW: 14.6 % (ref 11.5–15.5)
WBC: 8.4 10*3/uL (ref 4.0–10.5)

## 2017-05-12 LAB — POCT I-STAT, CHEM 8
BUN: 16 mg/dL (ref 6–20)
CALCIUM ION: 1.35 mmol/L (ref 1.15–1.40)
Chloride: 108 mmol/L (ref 101–111)
Creatinine, Ser: 1 mg/dL (ref 0.44–1.00)
Glucose, Bld: 92 mg/dL (ref 65–99)
HEMATOCRIT: 42 % (ref 36.0–46.0)
HEMOGLOBIN: 14.3 g/dL (ref 12.0–15.0)
Potassium: 3.8 mmol/L (ref 3.5–5.1)
SODIUM: 142 mmol/L (ref 135–145)
TCO2: 24 mmol/L (ref 22–32)

## 2017-05-12 LAB — APTT: aPTT: 31 seconds (ref 24–36)

## 2017-05-12 LAB — TROPONIN I
Troponin I: 2.3 ng/mL (ref <0.03)
Troponin I: 2.76 ng/mL (ref <0.03)

## 2017-05-12 LAB — PROTIME-INR
INR: 1.08
PROTHROMBIN TIME: 13.9 s (ref 11.4–15.2)

## 2017-05-12 LAB — POCT ACTIVATED CLOTTING TIME
ACTIVATED CLOTTING TIME: 389 s
ACTIVATED CLOTTING TIME: 153 s
Activated Clotting Time: 197 seconds

## 2017-05-12 SURGERY — LEFT HEART CATH AND CORONARY ANGIOGRAPHY
Anesthesia: LOCAL

## 2017-05-12 MED ORDER — VITAMIN D 1000 UNITS PO TABS
2000.0000 [IU] | ORAL_TABLET | Freq: Every day | ORAL | Status: DC
Start: 2017-05-13 — End: 2017-05-14
  Administered 2017-05-13 – 2017-05-14 (×2): 2000 [IU] via ORAL
  Filled 2017-05-12 (×2): qty 2

## 2017-05-12 MED ORDER — BIVALIRUDIN BOLUS VIA INFUSION - CUPID
INTRAVENOUS | Status: DC | PRN
  Administered 2017-05-12: 34.05 mg via INTRAVENOUS

## 2017-05-12 MED ORDER — MIDAZOLAM HCL 2 MG/2ML IJ SOLN
INTRAMUSCULAR | Status: AC
  Filled 2017-05-12: qty 2

## 2017-05-12 MED ORDER — ASPIRIN 81 MG PO TABS: 81.0000 mg | ORAL_TABLET | Freq: Every day | ORAL | Status: DC

## 2017-05-12 MED ORDER — FENTANYL CITRATE (PF) 100 MCG/2ML IJ SOLN
INTRAMUSCULAR | Status: DC | PRN
  Administered 2017-05-12: 25 ug via INTRAVENOUS

## 2017-05-12 MED ORDER — NITROGLYCERIN 1 MG/10 ML FOR IR/CATH LAB
INTRA_ARTERIAL | Status: AC
  Filled 2017-05-12: qty 10

## 2017-05-12 MED ORDER — SODIUM CHLORIDE 0.9% FLUSH
3.0000 mL | Freq: Two times a day (BID) | INTRAVENOUS | Status: DC
  Administered 2017-05-12 – 2017-05-14 (×4): 3 mL via INTRAVENOUS

## 2017-05-12 MED ORDER — CARVEDILOL 3.125 MG PO TABS
3.1250 mg | ORAL_TABLET | Freq: Two times a day (BID) | ORAL | Status: DC
  Administered 2017-05-12 – 2017-05-14 (×4): 3.125 mg via ORAL
  Filled 2017-05-12 (×4): qty 1

## 2017-05-12 MED ORDER — VERAPAMIL HCL 2.5 MG/ML IV SOLN
INTRAVENOUS | Status: AC
  Filled 2017-05-12: qty 2

## 2017-05-12 MED ORDER — HEPARIN (PORCINE) IN NACL 2-0.9 UNIT/ML-% IJ SOLN
INTRAMUSCULAR | Status: AC
  Filled 2017-05-12: qty 1000

## 2017-05-12 MED ORDER — BIVALIRUDIN TRIFLUOROACETATE 250 MG IV SOLR
INTRAVENOUS | Status: AC
  Filled 2017-05-12: qty 250

## 2017-05-12 MED ORDER — MORPHINE SULFATE (PF) 4 MG/ML IV SOLN: 2.0000 mg | INTRAVENOUS | Status: DC | PRN

## 2017-05-12 MED ORDER — TICAGRELOR 90 MG PO TABS
ORAL_TABLET | ORAL | Status: DC | PRN
  Administered 2017-05-12: 180 mg via ORAL

## 2017-05-12 MED ORDER — ACETAMINOPHEN 325 MG PO TABS: 650.0000 mg | ORAL_TABLET | ORAL | Status: DC | PRN

## 2017-05-12 MED ORDER — IOPAMIDOL (ISOVUE-370) INJECTION 76%
INTRAVENOUS | Status: AC
  Filled 2017-05-12: qty 50

## 2017-05-12 MED ORDER — LIDOCAINE HCL (PF) 1 % IJ SOLN
INTRAMUSCULAR | Status: AC
  Filled 2017-05-12: qty 30

## 2017-05-12 MED ORDER — PNEUMOCOCCAL 13-VAL CONJ VACC IM SUSP
0.5000 mL | INTRAMUSCULAR | Status: DC
  Filled 2017-05-12: qty 0.5

## 2017-05-12 MED ORDER — HEPARIN SODIUM (PORCINE) 1000 UNIT/ML IJ SOLN
INTRAMUSCULAR | Status: DC | PRN
  Administered 2017-05-12: 2000 [IU] via INTRAVENOUS

## 2017-05-12 MED ORDER — NITROGLYCERIN 0.4 MG SL SUBL: 0.4000 mg | SUBLINGUAL_TABLET | SUBLINGUAL | Status: DC | PRN

## 2017-05-12 MED ORDER — TICAGRELOR 90 MG PO TABS
90.0000 mg | ORAL_TABLET | Freq: Two times a day (BID) | ORAL | Status: DC
  Administered 2017-05-12 – 2017-05-14 (×4): 90 mg via ORAL
  Filled 2017-05-12 (×4): qty 1

## 2017-05-12 MED ORDER — INFLUENZA VAC SPLIT HIGH-DOSE 0.5 ML IM SUSY
0.5000 mL | PREFILLED_SYRINGE | INTRAMUSCULAR | Status: DC
  Filled 2017-05-12: qty 0.5

## 2017-05-12 MED ORDER — HYDRALAZINE HCL 20 MG/ML IJ SOLN: 5.0000 mg | INTRAMUSCULAR | Status: AC | PRN

## 2017-05-12 MED ORDER — ATROPINE SULFATE 1 MG/10ML IJ SOSY
PREFILLED_SYRINGE | INTRAMUSCULAR | Status: AC
  Filled 2017-05-12: qty 10

## 2017-05-12 MED ORDER — SODIUM CHLORIDE 0.9 % IV SOLN
INTRAVENOUS | Status: AC | PRN
  Administered 2017-05-12: 1.75 mg/kg/h via INTRAVENOUS

## 2017-05-12 MED ORDER — PNEUMOCOCCAL VAC POLYVALENT 25 MCG/0.5ML IJ INJ: 0.5000 mL | INJECTION | INTRAMUSCULAR | Status: DC

## 2017-05-12 MED ORDER — SODIUM CHLORIDE 0.9% FLUSH: 3.0000 mL | INTRAVENOUS | Status: DC | PRN

## 2017-05-12 MED ORDER — AMLODIPINE BESYLATE 5 MG PO TABS
5.0000 mg | ORAL_TABLET | Freq: Every day | ORAL | Status: DC
  Administered 2017-05-12 – 2017-05-14 (×3): 5 mg via ORAL
  Filled 2017-05-12 (×3): qty 1

## 2017-05-12 MED ORDER — FENTANYL CITRATE (PF) 100 MCG/2ML IJ SOLN
INTRAMUSCULAR | Status: AC
  Filled 2017-05-12: qty 2

## 2017-05-12 MED ORDER — OXYCODONE HCL 5 MG PO TABS: 5.0000 mg | ORAL_TABLET | ORAL | Status: DC | PRN

## 2017-05-12 MED ORDER — TICAGRELOR 90 MG PO TABS
ORAL_TABLET | ORAL | Status: AC
  Filled 2017-05-12: qty 2

## 2017-05-12 MED ORDER — IOPAMIDOL (ISOVUE-370) INJECTION 76%
INTRAVENOUS | Status: DC | PRN
  Administered 2017-05-12: 198 mL via INTRAVENOUS

## 2017-05-12 MED ORDER — ATORVASTATIN CALCIUM 80 MG PO TABS
80.0000 mg | ORAL_TABLET | Freq: Every day | ORAL | Status: DC
  Administered 2017-05-12 – 2017-05-13 (×2): 80 mg via ORAL
  Filled 2017-05-12 (×3): qty 1

## 2017-05-12 MED ORDER — NITROGLYCERIN 1 MG/10 ML FOR IR/CATH LAB
INTRA_ARTERIAL | Status: DC | PRN
  Administered 2017-05-12 (×3): 150 ug via INTRACORONARY

## 2017-05-12 MED ORDER — MIDAZOLAM HCL 2 MG/2ML IJ SOLN
INTRAMUSCULAR | Status: DC | PRN
  Administered 2017-05-12: 1 mg via INTRAVENOUS
  Administered 2017-05-12: 2 mg via INTRAVENOUS

## 2017-05-12 MED ORDER — SODIUM CHLORIDE 0.9 % IV SOLN: 250.0000 mL | INTRAVENOUS | Status: DC | PRN

## 2017-05-12 MED ORDER — IOPAMIDOL (ISOVUE-370) INJECTION 76%
INTRAVENOUS | Status: AC
  Filled 2017-05-12: qty 125

## 2017-05-12 MED ORDER — HEPARIN (PORCINE) IN NACL 2-0.9 UNIT/ML-% IJ SOLN
INTRAMUSCULAR | Status: AC | PRN
  Administered 2017-05-12: 1500 mL

## 2017-05-12 MED ORDER — LIDOCAINE HCL (PF) 1 % IJ SOLN
INTRAMUSCULAR | Status: DC | PRN
  Administered 2017-05-12: 15 mL

## 2017-05-12 MED ORDER — HEPARIN SODIUM (PORCINE) 1000 UNIT/ML IJ SOLN
INTRAMUSCULAR | Status: AC
  Filled 2017-05-12: qty 1

## 2017-05-12 MED ORDER — ONDANSETRON HCL 4 MG/2ML IJ SOLN: 4.0000 mg | Freq: Four times a day (QID) | INTRAMUSCULAR | Status: DC | PRN

## 2017-05-12 MED ORDER — LABETALOL HCL 5 MG/ML IV SOLN: 10.0000 mg | INTRAVENOUS | Status: AC | PRN

## 2017-05-12 MED ORDER — ALPRAZOLAM 0.25 MG PO TABS
0.2500 mg | ORAL_TABLET | Freq: Every day | ORAL | Status: DC
  Administered 2017-05-12 – 2017-05-14 (×3): 0.25 mg via ORAL
  Filled 2017-05-12 (×3): qty 1

## 2017-05-12 MED ORDER — HEPARIN SODIUM (PORCINE) 5000 UNIT/ML IJ SOLN
5000.0000 [IU] | Freq: Three times a day (TID) | INTRAMUSCULAR | Status: DC
Start: 2017-05-13 — End: 2017-05-14
  Administered 2017-05-13 – 2017-05-14 (×4): 5000 [IU] via SUBCUTANEOUS
  Filled 2017-05-12 (×4): qty 1

## 2017-05-12 MED ORDER — SODIUM CHLORIDE 0.9 % WEIGHT BASED INFUSION: 1.0000 mL/kg/h | INTRAVENOUS | Status: AC

## 2017-05-12 MED ORDER — ASPIRIN EC 81 MG PO TBEC
81.0000 mg | DELAYED_RELEASE_TABLET | Freq: Every day | ORAL | Status: DC
  Administered 2017-05-13 – 2017-05-14 (×2): 81 mg via ORAL
  Filled 2017-05-12 (×2): qty 1

## 2017-05-12 SURGICAL SUPPLY — 20 items
BALLN SAPPHIRE 2.5X12 (BALLOONS) ×2
BALLN SAPPHIRE ~~LOC~~ 2.5X8 (BALLOONS) ×1 IMPLANT
BALLN SAPPHIRE ~~LOC~~ 3.0X8 (BALLOONS) ×1 IMPLANT
BALLN SAPPHIRE ~~LOC~~ 3.25X8 (BALLOONS) ×1 IMPLANT
BALLOON SAPPHIRE 2.5X12 (BALLOONS) IMPLANT
CATH INFINITI 5FR MULTPACK ANG (CATHETERS) ×1 IMPLANT
CATH VISTA GUIDE 6FR XBLAD3.5 (CATHETERS) ×1 IMPLANT
COVER PRB 48X5XTLSCP FOLD TPE (BAG) IMPLANT
COVER PROBE 5X48 (BAG) ×2
KIT ENCORE 26 ADVANTAGE (KITS) ×1 IMPLANT
KIT HEART LEFT (KITS) ×2 IMPLANT
PACK CARDIAC CATHETERIZATION (CUSTOM PROCEDURE TRAY) ×2 IMPLANT
SET INTRODUCER MICROPUNCT 5F (INTRODUCER) ×1 IMPLANT
SHEATH PINNACLE 6F 10CM (SHEATH) ×1 IMPLANT
STENT SYNERGY DES 2.75X12 (Permanent Stent) ×1 IMPLANT
STENT SYNERGY DES 3X12 (Permanent Stent) ×1 IMPLANT
TRANSDUCER W/STOPCOCK (MISCELLANEOUS) ×2 IMPLANT
TUBING CIL FLEX 10 FLL-RA (TUBING) ×2 IMPLANT
WIRE COUGAR XT STRL 190CM (WIRE) ×1 IMPLANT
WIRE EMERALD 3MM-J .035X150CM (WIRE) ×1 IMPLANT

## 2017-05-12 NOTE — ED Triage Notes (Signed)
[facility] STEMI with cp for a few days, worse since this morning. 324 ASA and 3 NTG PTA. A&Ox4, on 2L [place], BP 122/64. [facility]

## 2017-05-12 NOTE — Progress Notes (Signed)
Angiomax infusing on arrival to holdong area at 15.8cc/hr

## 2017-05-12 NOTE — Progress Notes (Signed)
Femoral sheath removed at this time per protocol. Manual pressure held for 22 minutes. No hematoma present at this time. Pt tolerated well. Site dressed with gauze and pressure dressing. RN will continue to monitor.

## 2017-05-12 NOTE — ED Provider Notes (Signed)
[facility] CARDIAC CATH LAB Provider Note   CSN: [id number] Arrival date & time:        History   Chief Complaint No chief complaint on file.   HPI [name] is a 66 y.o. female.  HPI    66 year old female who was having chest pain yesterday.  Her chest pain continued today while she was at work.  She works at [employer] zone.  She reports the chest pain used to be all over her chest and is now just in her left shoulder rating down her left arm.  EMS called code STEMI from scene.  Patient was given aspirin prior to arrival.  Patient had taken 4 nitro yesterday 3 today.  Last visit in our system was to Dr. Antoine Poche.  CABG in 1996.  also had left subclavian stenosis and had bypass of her left arm in 2008.  POET (Plain Old Exercise Treadmill) 2016 normal. Seen in may for intermittent CP.   Follows with Dr. Gery Pray as an outpatient.  Still smoking/.   Past Medical History:  Diagnosis Date  . Allergy   . COPD (chronic obstructive pulmonary disease) (HCC)   . Coronary artery disease 05/16/1995   Cathed in November, 05/20/1995--CABG x 1-----LIMA to LAD  . Hyperlipidemia   . Hypertension   . Myocardial infarction (HCC)   . Nephrolithiasis    Left Nephrectomy   . Smoker   . Stroke [facility])     Patient Active Problem List   Diagnosis Date Noted  . Medication management 10/24/2016  . Coronary artery disease involving native coronary artery of native heart without angina pectoris 10/24/2016  . PVD (peripheral vascular disease) (HCC) 10/24/2016  . Carotid bruit present 03/12/2016  . Essential hypertension 05/29/2015  . COPD (chronic obstructive pulmonary disease) (HCC) 02/01/2015  . Solitary kidney, acquired 02/01/2015  . History of nephrolithiasis 02/01/2015  . Hx of CABG 01/30/2015  . Hyperlipidemia 01/30/2015  . Smoker 01/30/2015  . Vitamin D deficiency 01/30/2015  . Osteoporosis 01/30/2015  . Subclavian artery stenosis, left (HCC) 01/30/2015     Past Surgical History:  Procedure Laterality Date  . CAROTID ARTERY - SUBCLAVIAN ARTERY BYPASS GRAFT Left 04/18/2007  . CORONARY ARTERY BYPASS GRAFT    . KIDNEY SURGERY Left    66 yo  . Left Nephrectomy Left 10/17/1983   Dr. Sural---StagHornCalculus, Left Kidney Nonfunctioning wiht Acute and Chronic Pyelonephritis    OB History    No data available       Home Medications    Prior to Admission medications   Medication Sig Start Date End Date Taking? Authorizing Provider  alendronate (FOSAMAX) 70 MG tablet TAKE 1 TABLET BY MOUTH ONCE WEEKLY TAKE WITH A FULL GLASS OF WATER ON AN EMPTY STOMACH 04/21/17   Dorena Bodo, PA-C  ALPRAZolam Prudy Feeler) 0.25 MG tablet TAKE 1 TABLET EVERY DAY 02/19/17   Allayne Butcher B, PA-C  amLODipine (NORVASC) 5 MG tablet Take 1 tablet (5 mg total) by mouth daily. 08/29/16 11/27/16  Barrett, Joline Salt, PA-C  aspirin 81 MG tablet Take 81 mg by mouth daily.    [provider]  Cholecalciferol (VITAMIN D3) 2000 units capsule Take 1 capsule (2,000 Units total) by mouth daily. 11/16/15   Dorena Bodo, PA-C  nitroGLYCERIN (NITROSTAT) 0.4 MG SL tablet PLACE 1 TABLET ON THE TONGUE &LET DISSOLVE EVERY 5 MINS AS NEEDED FOR CHEST PAIN 04/24/15   Allayne Butcher B, PA-C  triamcinolone cream (KENALOG) 0.1 % APPLY 1 APPLICATION TOPICALLY 2 (  TWO) TIMES DAILY. 06/18/16   Dorena Bodoixon, Mary B, PA-C    Family History Family History  Problem Relation Age of Onset  . Depression Sister   . COPD Father   . Cancer Sister        hysterectomy sec to cancer  . Cancer Sister        cervical cancer  . COPD Brother     Social History Social History   Tobacco Use  . Smoking status: Current Every Day Smoker    Packs/day: 0.25    Years: 40.00    Pack years: 10.00    Types: Cigarettes  . Smokeless tobacco: Never Used  Substance Use Topics  . Alcohol use: No  . Drug use: No     Allergies   Patient has no known allergies.   Review of Systems Review of Systems  Constitutional:  Negative for fatigue and fever.  Respiratory: Positive for chest tightness and shortness of breath.   Cardiovascular: Positive for chest pain.     Physical Exam Updated Vital Signs There were no vitals taken for this visit.  Physical Exam  Constitutional: She is oriented to person, place, and time. She appears well-developed and well-nourished.  Cachectic female.  HENT:  Head: Normocephalic and atraumatic.  Eyes: Right eye exhibits no discharge.  Cardiovascular: Normal rate, regular rhythm and normal heart sounds.  No murmur heard. Pulmonary/Chest: Effort normal and breath sounds normal. She has no wheezes. She has no rales.  miidline chest scar  Abdominal: Soft. She exhibits no distension. There is no tenderness.  Neurological: She is oriented to person, place, and time.  Skin: Skin is warm and dry. She is not diaphoretic.  Psychiatric: She has a normal mood and affect.  Nursing note and vitals reviewed.    ED Treatments / Results  Labs (all labs ordered are listed, but only abnormal results are displayed) Labs Reviewed - No data to display  EKG  EKG Interpretation None       Radiology No results found.  Procedures Procedures (including critical care time)  CRITICAL CARE Performed by: Arlana Hoveourteney L Elcie Pelster Total critical care time: 45 minutes Critical care time was exclusive of separately billable procedures and treating other patients. Critical care was necessary to treat or prevent imminent or life-threatening deterioration. Critical care was time spent personally by me on the following activities: development of treatment plan with patient and/or surrogate as well as nursing, discussions with consultants, evaluation of patient's response to treatment, examination of patient, obtaining history from patient or surrogate, ordering and performing treatments and interventions, ordering and review of laboratory studies, ordering and review of radiographic studies, pulse  oximetry and re-evaluation of patient's condition.   Medications Ordered in ED Medications - No data to display   Initial Impression / Assessment and Plan / ED Course  I have reviewed the triage vital signs and the nursing notes.  Pertinent labs & imaging results that were available during my care of the patient were reviewed by me and considered in my medical decision making (see chart for details).     66 year old female who was having chest pain yesterday.  Her chest pain continued today while she was at work.  She works at [employer] zone.  She reports the chest pain used to be all over her chest and is now just in her left shoulder rating down her left arm.  EMS called code STEMI from scene.  Patient was given aspirin prior to arrival.  Patient had taken 4  nitro yesterday 3 today.  3 of CABG in 1996.  Follows with Dr. Gery PrayBarry as an outpatient.  EKG showed ST elevations 2 3 aVF and theV3-V6  Concern for reocclusion.  Patient going to Cath Lab.  Was not in ED longer to start heparin.    Final Clinical Impressions(s) / ED Diagnoses   Final diagnoses:  None    ED Discharge Orders    None       Abelino DerrickMackuen, Shanel Prazak Lyn, MD 05/12/17 [phone]

## 2017-05-12 NOTE — H&P (Signed)
History & Physical    Patient ID: [name] MRN: [id number], DOB/AGE: 07/30/1950   Admit date: 05/12/2017   Primary Physician: Dorena Bodo, PA-C Primary Cardiologist: Hochrein  Patient Profile    66 yo female with PMH of CAD s/p CABG (LIMA--> LAD), COPD, HTN, HL, left nephrectomy, left subclavian stenosis with bypass in the left arm ('08) and tobacco use who presented with chest pain. STEMI called in the field.   Past Medical History   Past Medical History:  Diagnosis Date  . Allergy   . COPD (chronic obstructive pulmonary disease) (HCC)   . Coronary artery disease 05/16/1995   Cathed in November, 05/20/1995--CABG x 1-----LIMA to LAD  . Hyperlipidemia   . Hypertension   . Myocardial infarction (HCC)   . Nephrolithiasis    Left Nephrectomy   . Smoker   . Stroke [facility])     Past Surgical History:  Procedure Laterality Date  . CAROTID ARTERY - SUBCLAVIAN ARTERY BYPASS GRAFT Left 04/18/2007  . CORONARY ARTERY BYPASS GRAFT    . KIDNEY SURGERY Left    66 yo  . Left Nephrectomy Left 10/17/1983   Dr. Sural---StagHornCalculus, Left Kidney Nonfunctioning wiht Acute and Chronic Pyelonephritis     Allergies  No Known Allergies  History of Present Illness    [name] is a 66 yo female with PMH of CAD s/p CABG (LIMA--> LAD), COPD, HTN, HL, left nephrectomy, left subclavian stenosis with bypass in the left arm ('08) and tobacco use. She was sent for a POET in 2016 that was negative for ischemia. Last lower extremity dopplers noted 50-74% in the right mid SFA and 50-74% and in the left mid SFA. At her last appt in 5/18 she did report come brief episodes of chest pain, and shortness of breath if she moves to quickly. Her statin was changed from Zocor to Lipitor 40mg  daily. Smoking cessation was discussed again with the patient.   She reports having intermittent episodes of chest tightness over the past week. Had an episode the day of Thanksgiving, then another yesterday.  Reports episode yesterday last several hours. Then while driving to work this morning around 7:10am developed centralized chest pain with radiation down her left arm. She took 3 SL nitro and called EMS. On arrival EKG showed ST elevation in the inferolateral leads. She was given 324 ASA prior to arrival. On arrival was pain free, and brought directly to the cath lab for emergent cardiac cath.   Home Medications    Prior to Admission medications   Medication Sig Start Date End Date Taking? Authorizing Provider  alendronate (FOSAMAX) 70 MG tablet TAKE 1 TABLET BY MOUTH ONCE WEEKLY TAKE WITH A FULL GLASS OF WATER ON AN EMPTY STOMACH 04/21/17   Dorena Bodo, PA-C  ALPRAZolam Prudy Feeler) 0.25 MG tablet TAKE 1 TABLET EVERY DAY 02/19/17   Allayne Butcher B, PA-C  amLODipine (NORVASC) 5 MG tablet Take 1 tablet (5 mg total) by mouth daily. 08/29/16 11/27/16  Barrett, Joline Salt, PA-C  aspirin 81 MG tablet Take 81 mg by mouth daily.    [provider]  Cholecalciferol (VITAMIN D3) 2000 units capsule Take 1 capsule (2,000 Units total) by mouth daily. 11/16/15   Dorena Bodo, PA-C  nitroGLYCERIN (NITROSTAT) 0.4 MG SL tablet PLACE 1 TABLET ON THE TONGUE &LET DISSOLVE EVERY 5 MINS AS NEEDED FOR CHEST PAIN 04/24/15   Allayne Butcher B, PA-C  triamcinolone cream (KENALOG) 0.1 % APPLY 1 APPLICATION TOPICALLY 2 (TWO) TIMES DAILY.  06/18/16   Dorena Bodoixon, Mary B, PA-C    Family History    Family History  Problem Relation Age of Onset  . Depression Sister   . COPD Father   . Cancer Sister        hysterectomy sec to cancer  . Cancer Sister        cervical cancer  . COPD Brother     Social History    Social History   Socioeconomic History  . Marital status: Married    Spouse name: Not on file  . Number of children: 2  . Years of education: Not on file  . Highest education level: Not on file  Social Needs  . Financial resource strain: Not on file  . Food insecurity - worry: Not on file  . Food insecurity - inability:  Not on file  . Transportation needs - medical: Not on file  . Transportation needs - non-medical: Not on file  Occupational History  . Not on file  Tobacco Use  . Smoking status: Current Every Day Smoker    Packs/day: 0.25    Years: 40.00    Pack years: 10.00    Types: Cigarettes  . Smokeless tobacco: Never Used  Substance and Sexual Activity  . Alcohol use: No  . Drug use: No  . Sexual activity: Yes  Other Topics Concern  . Not on file  Social History Narrative   Lives alone.       Review of Systems    See HPI  All other systems reviewed and are otherwise negative except as noted above.  Physical Exam    Height 5\' 2"  (1.575 m), weight 100 lb (45.4 kg), SpO2 99 %.  General: Pleasant, older older WF, NAD Psych: Normal affect. Neuro: Alert and oriented X 3. Moves all extremities spontaneously. HEENT: Normal  Neck: Supple without bruits or JVD. Lungs:  Resp regular and unlabored, CTA. Heart: RRR no s3, s4, or murmurs. Abdomen: Soft, non-tender, non-distended, BS + x 4.  Extremities: No clubbing, cyanosis or edema. DP/PT/Radials 2+ and equal bilaterally.  Labs    Troponin (Point of Care Test) No results for input(s): TROPIPOC in the last 72 hours. No results for input(s): CKTOTAL, CKMB, TROPONINI in the last 72 hours. Lab Results  Component Value Date   WBC 6.4 01/30/2015   HGB 15.2 (H) 01/30/2015   HCT 45.4 01/30/2015   MCV 95.6 01/30/2015   PLT 272 01/30/2015   No results for input(s): NA, K, CL, CO2, BUN, CREATININE, CALCIUM, PROT, BILITOT, ALKPHOS, ALT, AST, GLUCOSE in the last 168 hours.  Invalid input(s): LABALBU Lab Results  Component Value Date   CHOL 164 04/25/2016   HDL 54 04/25/2016   LDLCALC 97 04/25/2016   TRIG 66 04/25/2016   No results found for: [facility]   Radiology Studies    No results found.  ECG & Cardiac Imaging    EKG: SR with ST elevation in inferolateral leads  Assessment & Plan    66 yo female with PMH of CAD s/p CABG  (LIMA--> LAD), COPD, HTN, HL, left nephrectomy, left subclavian stenosis with bypass in the left arm ('08) and tobacco use who presented with chest pain. STEMI called in the field.   STEMI: has had several days of intermittent chest pain over the past week, but developed a sudden onset at 7:10am this morning. Took 3SL nitro and ASA prior to arrival. Code STEMI called in the field and brought directly to the cath lab. Further recommendations  post cath.   Janice CoffinSigned, Lindsay Roberts, NP-C Pager [phone] 05/12/2017, 8:33 AM   Patient seen, examined. Available data reviewed. Agree with findings, assessment, and plan as outlined by Laverda PageLindsay Roberts, NP-C.  On exam the patient is an alert, oriented woman in no distress.  She is thin.  Lungs are clear.  JVP is normal.  Heart is regular rate and rhythm with no murmurs or gallops.  Abdomen is soft, thin, nontender, extremities are without edema.  Skin is warm and dry without rash.  EKG shows normal sinus rhythm with anterolateral and inferior ST elevation consistent with an anterolateral/inferior STEMI.  Recommend proceeding directly with emergency cardiac catheterization and possible PCI.  The patient has an extensive history with remote bypass involving a single-vessel LIMA to LAD graft.  She later went on to have a left carotid to subclavian bypass for severe left subclavian stenosis that could not be approached via endovascular methods.  Tonny BollmanMichael Johngabriel Verde, M.D. 05/12/2017 10:29 AM

## 2017-05-13 ENCOUNTER — Inpatient Hospital Stay (HOSPITAL_COMMUNITY): Payer: BLUE CROSS/BLUE SHIELD

## 2017-05-13 DIAGNOSIS — Z720 Tobacco use: Secondary | ICD-10-CM

## 2017-05-13 DIAGNOSIS — R079 Chest pain, unspecified: Secondary | ICD-10-CM

## 2017-05-13 DIAGNOSIS — E782 Mixed hyperlipidemia: Secondary | ICD-10-CM

## 2017-05-13 LAB — LIPID PANEL
CHOL/HDL RATIO: 2.7 ratio
CHOLESTEROL: 149 mg/dL (ref 0–200)
HDL: 56 mg/dL (ref >40)
LDL Cholesterol: 67 mg/dL (ref 0–99)
Triglycerides: 128 mg/dL (ref <150)
VLDL: 26 mg/dL (ref 0–40)

## 2017-05-13 LAB — CBC
HCT: 41.3 % (ref 36.0–46.0)
Hemoglobin: 13.8 g/dL (ref 12.0–15.0)
MCH: 32 pg (ref 26.0–34.0)
MCHC: 33.4 g/dL (ref 30.0–36.0)
MCV: 95.8 fL (ref 78.0–100.0)
PLATELETS: 230 10*3/uL (ref 150–400)
RBC: 4.31 MIL/uL (ref 3.87–5.11)
RDW: 14.8 % (ref 11.5–15.5)
WBC: 9.3 10*3/uL (ref 4.0–10.5)

## 2017-05-13 LAB — ECHOCARDIOGRAM COMPLETE
CHL CUP TV REG PEAK VELOCITY: 211 cm/s
E decel time: 165 msec
EERAT: 10.71
FS: 36 % (ref 28–44)
Height: 62 in
IVS/LV PW RATIO, ED: 1.15
LA ID, A-P, ES: 34 mm
LA diam index: 2.42 cm/m2
LA vol A4C: 33.8 ml
LA vol index: 32.9 mL/m2
LA vol: 46.2 mL
LEFT ATRIUM END SYS DIAM: 34 mm
LV PW d: 11 mm — AB (ref 0.6–1.1)
LV TDI E'MEDIAL: 6.2
LVEEAVG: 10.71
LVEEMED: 10.71
LVELAT: 7.7 cm/s
LVOT area: 2.27 cm2
LVOT diameter: 17 mm
MV Dec: 165
MV pk A vel: 59.7 m/s
MV pk E vel: 82.5 m/s
MVPG: 3 mmHg
RV LATERAL S' VELOCITY: 8.92 cm/s
RV TAPSE: 17.1 mm
TDI e' lateral: 7.7
TRMAXVEL: 211 cm/s
WEIGHTICAEL: 1600 [oz_av]

## 2017-05-13 LAB — BASIC METABOLIC PANEL
Anion gap: 4 — ABNORMAL LOW (ref 5–15)
BUN: 13 mg/dL (ref 6–20)
CALCIUM: 9.6 mg/dL (ref 8.9–10.3)
CO2: 25 mmol/L (ref 22–32)
CREATININE: 1 mg/dL (ref 0.44–1.00)
Chloride: 109 mmol/L (ref 101–111)
GFR, EST NON AFRICAN AMERICAN: 58 mL/min — AB (ref >60)
Glucose, Bld: 98 mg/dL (ref 65–99)
Potassium: 4.4 mmol/L (ref 3.5–5.1)
SODIUM: 138 mmol/L (ref 135–145)

## 2017-05-13 LAB — HEMOGLOBIN A1C
Hgb A1c MFr Bld: 5 % (ref 4.8–5.6)
MEAN PLASMA GLUCOSE: 97 mg/dL

## 2017-05-13 LAB — HIV ANTIBODY (ROUTINE TESTING W REFLEX): HIV Screen 4th Generation wRfx: NONREACTIVE

## 2017-05-13 LAB — TROPONIN I: TROPONIN I: 2.57 ng/mL — AB (ref <0.03)

## 2017-05-13 MED ORDER — GUAIFENESIN-DM 100-10 MG/5ML PO SYRP
5.0000 mL | ORAL_SOLUTION | ORAL | Status: DC | PRN
  Administered 2017-05-13 (×3): 5 mL via ORAL
  Filled 2017-05-13 (×3): qty 5

## 2017-05-13 MED FILL — Verapamil HCl IV Soln 2.5 MG/ML: INTRAVENOUS | Qty: 2 | Status: AC

## 2017-05-13 NOTE — Progress Notes (Signed)
Progress Note  Patient Name: Carlis AbbottCarolyn Beane Date of Encounter: 05/13/2017  Primary Cardiologist: Dr. Allyson SabalBerry  Subjective   No chest pain  Inpatient Medications    Scheduled Meds: . ALPRAZolam  0.25 mg Oral Daily  . amLODipine  5 mg Oral Daily  . aspirin EC  81 mg Oral Daily  . atorvastatin  80 mg Oral q1800  . carvedilol  3.125 mg Oral BID WC  . cholecalciferol  2,000 Units Oral Daily  . heparin  5,000 Units Subcutaneous Q8H  . Influenza vac split quadrivalent PF  0.5 mL Intramuscular Tomorrow-1000  . pneumococcal 13-valent conjugate vaccine  0.5 mL Intramuscular Tomorrow-1000  . sodium chloride flush  3 mL Intravenous Q12H  . ticagrelor  90 mg Oral BID   Continuous Infusions: . sodium chloride     PRN Meds: sodium chloride, acetaminophen, guaiFENesin-dextromethorphan, morphine injection, nitroGLYCERIN, ondansetron (ZOFRAN) IV, oxyCODONE, sodium chloride flush   Vital Signs    Vitals:   05/13/17 0700 05/13/17 0818 05/13/17 0859 05/13/17 0900  BP: 131/68 117/67  120/82  Pulse: 62 77  76  Resp: 16 18  18   Temp:   98.8 F (37.1 C)   TempSrc:   Oral   SpO2: 96% 99%  97%  Weight:      Height:        Intake/Output Summary (Last 24 hours) at 05/13/2017 1018 Last data filed at 05/13/2017 0928 Gross per 24 hour  Intake 734 ml  Output 1700 ml  Net -966 ml   Filed Weights   05/12/17 0832  Weight: 100 lb (45.4 kg)    Telemetry    NSR - Personally Reviewed  ECG    NSR, anterior T wave inversions - Personally Reviewed  Physical Exam   GEN: No acute distress.   Neck: No JVD Cardiac: RRR, no murmurs, rubs, or gallops.  Respiratory: Clear to auscultation bilaterally. GI: Soft, nontender, non-distended  MS: No edema; No deformity. No right groin hematoma Neuro:  Nonfocal  Psych: Normal affect   Labs    Chemistry Recent Labs  Lab 05/12/17 0825 05/12/17 0836 05/13/17 0009  NA 142 139 138  K 3.8 3.8 4.4  CL 108 111 109  CO2  --  20* 25  GLUCOSE  92 94 98  BUN 16 16 13   CREATININE 1.00 0.91 1.00  CALCIUM  --  9.1 9.6  PROT  --  5.5*  --   ALBUMIN  --  3.4*  --   AST  --  39  --   ALT  --  12*  --   ALKPHOS  --  38  --   BILITOT  --  0.7  --   GFRNONAA  --  >60 58*  GFRAA  --  >60 >60  ANIONGAP  --  8 4*     Hematology Recent Labs  Lab 05/12/17 0825 05/12/17 0836 05/13/17 0009  WBC  --  8.4 9.3  RBC  --  4.14 4.31  HGB 14.3 13.0 13.8  HCT 42.0 39.7 41.3  MCV  --  95.9 95.8  MCH  --  31.4 32.0  MCHC  --  32.7 33.4  RDW  --  14.6 14.8  PLT  --  225 230    Cardiac Enzymes Recent Labs  Lab 05/12/17 0836 05/12/17 1851 05/13/17 0009  TROPONINI 2.30* 2.76* 2.57*   No results for input(s): TROPIPOC in the last 168 hours.   BNPNo results for input(s): BNP, PROBNP in the last 168  hours.   DDimer No results for input(s): DDIMER in the last 168 hours.   Radiology    No results found.  Cardiac Studies   Cath results reviewed  Patient Profile     66 y.o. female with anterior MI  Assessment & Plan    CAD: Status post LAD stent.  Continue dual antiplatelet therapy for at least a year.  Continue aggressive secondary prevention including smoking cessation.  Tobacco abuse: I encouraged her to stop smoking.  Can try patch if she would like to help with smoking cessation.  Hyperlipidemia: Continue high-dose statin.  She is currently receiving atorvastatin 80 mg. Hypertension: Blood pressure control.  For questions or updates, please contact [facility] Please consult [email or web address] for contact info under Cardiology/STEMI.      Signed, Lance MussJayadeep Master Touchet, MD  05/13/2017, 10:18 AM

## 2017-05-13 NOTE — Progress Notes (Signed)
CARDIAC REHAB PHASE I   PRE:  Rate/Rhythm: 68 SR    BP: sitting 110/63    SaO2: 97 RA  MODE:  Ambulation:  410 ft   POST:  Rate/Rhythm: 83 SR    BP: sitting 130/55     SaO2: 100 RA  Pt with fast pace, no dyspnea or CP. Likes to be active. Ed completed with good reception. She understands importance of Brilinta/ASA. Planning to quit smoking and liked the fake cigarette. Gave her resources. Will refer to [place] CRPII. Can walk independently.  [phone]   Jamie MassonRandi Kristan [name] CES, ACSM 05/13/2017 3:23 PM

## 2017-05-13 NOTE — Care Management Note (Addendum)
Case Management Note  Patient Details  Name: Carlis AbbottCarolyn Emmanuel MRN: [id number] Date of Birth: 10/08/1950  Subjective/Objective:   Pt is s/p cath with LAD stent                  Action/Plan:  PTA independent from home alone.  Pt has PCP and denied barriers to obtaining and paying for medications.  Pt given free 30 day card for Brilinta and reduced copay.  CM submitted benefit check to determine if prior Berkley Harveyauth is required.  Pt will get prescription filled at [facility] on [address] - pharmacy can fill.   Expected Discharge Date:  05/13/17               Expected Discharge Plan:  Home/Self Care  In-House Referral:     Discharge planning Services  CM Consult, Medication Assistance  Post Acute Care Choice:    Choice offered to:     DME Arranged:    DME Agency:     HH Arranged:    HH Agency:     Status of Service:  In process, will continue to follow  If discussed at Long Length of Stay Meetings, dates discussed:    Additional Comments: Benefit check submitted for Brilinta - prior auth is not required Cherylann ParrClaxton, Gentle Hoge S, RN 05/13/2017, 11:59 AM

## 2017-05-13 NOTE — Progress Notes (Signed)
Pt received from 2H. Pt oriented to room and equipment. Pt denies pain or needs at this time. Telemetry notified. Dinner tray re-routed. Call bell within reach, will continue to monitor.  Leonidas Rombergaitlin S Bumbledare, RN

## 2017-05-13 NOTE — Progress Notes (Addendum)
Report completed to [facility] on [facility] [address] [place]. Cardiac Rehab RN in working with pt at this moment, will transfer pt when she is done teaching and walking pt.

## 2017-05-13 NOTE — Progress Notes (Signed)
Attempted to call report to nurse on unit 4 [place], pt will transfer to room 4E14 telemetry bed. Nurse not available at this time, will try again.

## 2017-05-13 NOTE — Progress Notes (Signed)
  Echocardiogram 2D Echocardiogram has been performed.  Gualberto Wahlen G Zyrah Wiswell 05/13/2017, 10:59 AM

## 2017-05-14 ENCOUNTER — Encounter (HOSPITAL_COMMUNITY): Payer: Self-pay | Admitting: Cardiology

## 2017-05-14 DIAGNOSIS — I10 Essential (primary) hypertension: Secondary | ICD-10-CM

## 2017-05-14 DIAGNOSIS — F17200 Nicotine dependence, unspecified, uncomplicated: Secondary | ICD-10-CM

## 2017-05-14 LAB — MRSA PCR SCREENING: MRSA by PCR: NEGATIVE

## 2017-05-14 MED ORDER — PNEUMOCOCCAL 13-VAL CONJ VACC IM SUSP
0.5000 mL | Freq: Once | INTRAMUSCULAR | Status: AC
  Administered 2017-05-14: 0.5 mL via INTRAMUSCULAR
  Filled 2017-05-14: qty 0.5

## 2017-05-14 MED ORDER — ATORVASTATIN CALCIUM 40 MG PO TABS: 80.0000 mg | ORAL_TABLET | Freq: Every day | ORAL | 1 refills | Status: DC

## 2017-05-14 MED ORDER — TICAGRELOR 90 MG PO TABS: 90.0000 mg | ORAL_TABLET | Freq: Two times a day (BID) | ORAL | 1 refills | Status: DC

## 2017-05-14 MED ORDER — INFLUENZA VAC SPLIT HIGH-DOSE 0.5 ML IM SUSY
0.5000 mL | PREFILLED_SYRINGE | Freq: Once | INTRAMUSCULAR | Status: AC
  Administered 2017-05-14: 0.5 mL via INTRAMUSCULAR
  Filled 2017-05-14: qty 0.5

## 2017-05-14 MED ORDER — CARVEDILOL 3.125 MG PO TABS: 3.1250 mg | ORAL_TABLET | Freq: Two times a day (BID) | ORAL | 2 refills | Status: DC

## 2017-05-14 NOTE — Progress Notes (Signed)
Pt declined walking. Sts she has been up in room and feels fine. Reviewed smoking cessation, Brilinta and CRPII. Voiced understanding.  [phone] [name] CES, ACSM 10:56 AM 05/14/2017

## 2017-05-14 NOTE — Care Management (Signed)
Brilinta benefit check 1. BRILINTA  90 MG BID  COVER- YES  CO-PAY- $ 82.23  PRIOR APPROVAL- NO   PATIENT PAY 25 % OF TOTAL COAST $ 328.23  DEDUCTIBLE: NO   PREFERRED PHARMACY : [facility]

## 2017-05-14 NOTE — Discharge Summary (Signed)
Discharge Summary    Patient ID: Jamie AbbottCarolyn [name],  MRN: [id number], DOB/AGE: 65/01/1951 65 y.o.  Admit date: 05/12/2017 Discharge date: 05/14/2017  Primary Care Provider: Dorena Bodoixon, Mary B Primary Cardiologist: Allyson SabalBerry   Discharge Diagnoses    Active Problems:   STEMI involving left anterior descending coronary artery [facility](HCC)   Hyperlipidemia   Smoker   Essential hypertension   Allergies No Known Allergies  Diagnostic Studies/Procedures    Cath: 05/12/17  Conclusion   1.  Severe mid LAD stenosis treated successfully with PCI using a Synergy DES treated through the native vessel and stented just beyond the LIMA insertion site 2.  Severe ramus intermedius stenosis treated successfully with PCI using a single drug-eluting stent 3.  Patent left main, left circumflex, and RCA 4.  Mild segmental contraction abnormality of the left ventricle with periapical hypokinesis, LVEF estimated at 45-50%  Recommendations: Post MI medical therapy, aspirin and Brilinta for at least 12 months without interruption.  Angiomax will be continued until the current bag is completed.  Manual pressure will be used for femoral hemostasis.   TTE: 05/13/17  Study Conclusions  - Left ventricle: The cavity size was normal. Wall thickness was   increased in a pattern of mild LVH. Systolic function was mildly   reduced. The estimated ejection fraction was in the range of 45%   to 50%. Distal anterior, apical, apical septal, and inferoapical   hypokinesis suggestive of LAD territory ischemia/infarct. Doppler   parameters are consistent with abnormal left ventricular   relaxation (grade 1 diastolic dysfunction). The E/e&' ratio is   between 8-15, suggesting indeterminate LV filling pressure. - Mitral valve: Mildly thickened leaflets . There was trivial   regurgitation. - Left atrium: The atrium was normal in size. - Tricuspid valve: There was trivial regurgitation. - Pulmonary arteries: PA peak  pressure: 16 mm Hg (S). - Inferior vena cava: The vessel was normal in size. The   respirophasic diameter changes were in the normal range (>= 50%),   consistent with normal central venous pressure.  Impressions:  - LVEF 45-50%, mild LVH, distal anterior, apical, apical septal and   inferoapical hypokinesis, grade 1 DD, indeterminate LV filling   pressure, trivial MR, normal LA size, trivial TR, RVSP 16 mmHg,   normal IVC. _____________   History of Present Illness     [name] is a 66 yo female with PMH of CAD s/p CABG (LIMA--> LAD), COPD, HTN, HL, left nephrectomy, left subclavian stenosis with bypass in the left arm ('08) and tobacco use. She was sent for a POET in 2016 that was negative for ischemia. Last lower extremity dopplers noted 50-74% in the right mid SFA and 50-74% and in the left mid SFA. At her last appt in 5/18 she did report some brief episodes of chest pain, and shortness of breath if she moved to quickly. Her statin was changed from Zocor to Lipitor 40mg  daily. Smoking cessation was discussed again with the patient.   She reported having intermittent episodes of chest tightness over the past week prior to admission. Had an episode the day of Thanksgiving, then another the day prior to admisison. Reported episode the day prior last several hours. Then while driving to work the next morning around 7:10am developed centralized chest pain with radiation down her left arm. She took 3 SL nitro and called EMS. On arrival EKG showed ST elevation in the inferolateral leads. She was given 324 ASA prior to arrival. On arrival was pain  free, and brought directly to the cath lab for emergent cardiac cath.   Hospital Course     Underwent cardiac cath noted above with successful PCI/DES to dLAD below LIMA graft. Plan for DAPT with ASA/Brilinta for one year. Home statin was increased to Lipitor 80mg  daily. Post cath labs were stable. Troponin peaked at 2.76. Follow up echo showed EF of  45-50% with diffuse hypokinesis. She was started on low dose coreg with blood pressures tolerating well. Discussed the need for smoking cessation throughout admission. Worked well with cardiac rehab post cath.   General: Well developed, well nourished, female appearing in no acute distress. Head: Normocephalic, atraumatic.  Neck: Supple without bruits, JVD. Lungs:  Resp regular and unlabored, CTA. Heart: RRR, S1, S2, no S3, S4, or murmur; no rub. Abdomen: Soft, non-tender, non-distended with normoactive bowel sounds. No hepatomegaly. No rebound/guarding. No obvious abdominal masses. Extremities: No clubbing, cyanosis, edema. Distal pedal pulses are 2+ bilaterally. R femoral cath site stable with mild bruising but no hematoma. Neuro: Alert and oriented X 3. Moves all extremities spontaneously. Psych: Normal affect.  Jamie AbbottCarolyn [name] was seen by Dr. Eldridge DaceVaranasi and determined stable for discharge home. Follow up in the office has been arranged. Medications are listed below.   _____________  Discharge Vitals Blood pressure (!) 141/56, pulse 69, temperature 98.1 F (36.7 C), temperature source Oral, resp. rate 20, height 5\' 2"  (1.575 m), weight 100 lb (45.4 kg), SpO2 98 %.  Filed Weights   05/12/17 0832  Weight: 100 lb (45.4 kg)    Labs & Radiologic Studies    CBC Recent Labs    05/12/17 0836 05/13/17 0009  WBC 8.4 9.3  HGB 13.0 13.8  HCT 39.7 41.3  MCV 95.9 95.8  PLT 225 230   Basic Metabolic Panel Recent Labs    16/03/9610/26/18 0836 05/13/17 0009  NA 139 138  K 3.8 4.4  CL 111 109  CO2 20* 25  GLUCOSE 94 98  BUN 16 13  CREATININE 0.91 1.00  CALCIUM 9.1 9.6   Liver Function Tests Recent Labs    05/12/17 0836  AST 39  ALT 12*  ALKPHOS 38  BILITOT 0.7  PROT 5.5*  ALBUMIN 3.4*   No results for input(s): LIPASE, AMYLASE in the last 72 hours. Cardiac Enzymes Recent Labs    05/12/17 0836 05/12/17 1851 05/13/17 0009  TROPONINI 2.30* 2.76* 2.57*   BNP Invalid  input(s): POCBNP D-Dimer No results for input(s): DDIMER in the last 72 hours. Hemoglobin A1C Recent Labs    05/12/17 0836  HGBA1C 5.0   Fasting Lipid Panel Recent Labs    05/13/17 0009  CHOL 149  HDL 56  LDLCALC 67  TRIG 128  CHOLHDL 2.7   Thyroid Function Tests No results for input(s): TSH, T4TOTAL, T3FREE, THYROIDAB in the last 72 hours.  Invalid input(s): FREET3 _____________  No results found. Disposition   Pt is being discharged home today in good condition.  Follow-up Plans & Appointments    Follow-up Information    Abelino DerrickKilroy, Luke K, PA-C Follow up on 05/21/2017.   Specialties:  Cardiology, Radiology Why:  at 3pm for your follow up appt.  Contact information: [address] [place] [address] [phone]          Discharge Instructions    Amb Referral to Cardiac Rehabilitation   Complete by:  As directed    Diagnosis:   Coronary Stents STEMI PTCA     Diet - low sodium heart healthy  Complete by:  As directed    Discharge instructions   Complete by:  As directed    Groin Site Care Refer to this sheet in the next few weeks. These instructions provide you with information on caring for yourself after your procedure. Your caregiver may also give you more specific instructions. Your treatment has been planned according to current medical practices, but problems sometimes occur. Call your caregiver if you have any problems or questions after your procedure. HOME CARE INSTRUCTIONS You may shower 24 hours after the procedure. Remove the bandage (dressing) and gently wash the site with plain soap and water. Gently pat the site dry.  Do not apply powder or lotion to the site.  Do not sit in a bathtub, swimming pool, or whirlpool for 5 to 7 days.  No bending, squatting, or lifting anything over 10 pounds (4.5 kg) as directed by your caregiver.  Inspect the site at least twice daily.  Do not drive home if you are discharged the same day of the  procedure. Have someone else drive you.  You may drive 24 hours after the procedure unless otherwise instructed by your caregiver.  What to expect: Any bruising will usually fade within 1 to 2 weeks.  Blood that collects in the tissue (hematoma) may be painful to the touch. It should usually decrease in size and tenderness within 1 to 2 weeks.  SEEK IMMEDIATE MEDICAL CARE IF: You have unusual pain at the groin site or down the affected leg.  You have redness, warmth, swelling, or pain at the groin site.  You have drainage (other than a small amount of blood on the dressing).  You have chills.  You have a fever or persistent symptoms for more than 72 hours.  You have a fever and your symptoms suddenly get worse.  Your leg becomes pale, cool, tingly, or numb.  You have heavy bleeding from the site. Hold pressure on the site. Marland Kitchen  PLEASE DO NOT MISS ANY DOSES OF YOUR BRILINTA!!!!! Also keep a log of you blood pressures and bring back to your follow up appt. Please call the office with any questions.   Patients taking blood thinners should generally stay away from medicines like ibuprofen, Advil, Motrin, naproxen, and Aleve due to risk of stomach bleeding. You may take Tylenol as directed or talk to your primary doctor about alternatives.   Increase activity slowly   Complete by:  As directed       Discharge Medications     Medication List    STOP taking these medications   lisinopril 20 MG tablet Commonly known as:  PRINIVIL,ZESTRIL     TAKE these medications   alendronate 70 MG tablet Commonly known as:  FOSAMAX TAKE 1 TABLET BY MOUTH ONCE WEEKLY TAKE WITH A FULL GLASS OF WATER ON AN EMPTY STOMACH   ALPRAZolam 0.25 MG tablet Commonly known as:  XANAX TAKE 1 TABLET EVERY DAY   amLODipine 5 MG tablet Commonly known as:  NORVASC Take 1 tablet (5 mg total) by mouth daily.   aspirin 81 MG tablet Take 81 mg by mouth daily.   atorvastatin 40 MG tablet Commonly known as:   LIPITOR Take 2 tablets (80 mg total) by mouth daily. What changed:  how much to take   carvedilol 3.125 MG tablet Commonly known as:  COREG Take 1 tablet (3.125 mg total) by mouth 2 (two) times daily with a meal.   nitroGLYCERIN 0.4 MG SL tablet Commonly known as:  NITROSTAT PLACE 1 TABLET ON THE TONGUE &LET DISSOLVE EVERY 5 MINS AS NEEDED FOR CHEST PAIN   ticagrelor 90 MG Tabs tablet Commonly known as:  BRILINTA Take 1 tablet (90 mg total) by mouth 2 (two) times daily.   triamcinolone cream 0.1 % Commonly known as:  KENALOG APPLY 1 APPLICATION TOPICALLY 2 (TWO) TIMES DAILY.   Vitamin D3 2000 units capsule Take 1 capsule (2,000 Units total) by mouth daily.        Aspirin prescribed at discharge?  Yes High Intensity Statin Prescribed? (Lipitor 40-80mg  or Crestor 20-40mg ): Yes Beta Blocker Prescribed? Yes For EF <40%, was ACEI/ARB Prescribed? No: Consider resuming at outpatient if blood pressure stable.  ADP Receptor Inhibitor Prescribed? (i.e. Plavix etc.-Includes Medically Managed Patients): Yes For EF <40%, Aldosterone Inhibitor Prescribed? No: EF ok Was EF assessed during THIS hospitalization? Yes Was Cardiac Rehab II ordered? (Included Medically managed Patients): Yes   Outstanding Labs/Studies   FLP/LFTs in 6 weeks if tolerating statin increase.   Duration of Discharge Encounter   Greater than 30 minutes including physician time.  Signed, Laverda Page NP-C 05/14/2017, 9:23 AM   I have examined the patient and reviewed assessment and plan and discussed with patient.  Agree with above as stated.   GEN: Well nourished, well developed, in no acute distress  HEENT: normal  Neck: no JVD, carotid bruits, or masses Cardiac: RRR; no murmurs, rubs, or gallops,no edema  Respiratory:  clear to auscultation bilaterally, normal work of breathing GI: soft, nontender, nondistended,  MS: no deformity or atrophy  Skin: warm and dry, no rash Neuro:  Strength and sensation  are intact Psych: euthymic mood, full affect  She will use a plastic fake cigarette to try to stop smoking.  If unsuccessful, would consider using the patch.  I stressed the importance of DAPT.  Cardiac rehab would be beneficial. Holding ACE-I for now.  As BP increases, may be able to add this back at a  low dose. Continue amlodipine. Continue Coreg.  Plan discharge today.  Lance Muss

## 2017-05-14 NOTE — Care Management Note (Signed)
Case Management Note Original Note Created Cherylann ParrClaxton, Samantha S, RN 05/13/2017, 11:59 AM   Patient Details  Name: Jamie AbbottCarolyn [name] MRN: [id number] Date of Birth: 09/09/1950  Subjective/Objective:   Pt is s/p cath with LAD stent                  Action/Plan:  PTA independent from home alone.  Pt has PCP and denied barriers to obtaining and paying for medications.  Pt given free 30 day card for Brilinta and reduced copay.  CM submitted benefit check to determine if prior [employer] is required.  Pt will get prescription filled at [facility] on [address] - pharmacy can fill.   Expected Discharge Date:  05/14/17               Expected Discharge Plan:  Home/Self Care  In-House Referral:  NA  Discharge planning Services  CM Consult, Medication Assistance  Post Acute Care Choice:  NA Choice offered to:  NA  DME Arranged:  N/A DME Agency:  NA  HH Arranged:    HH Agency:     Status of Service:  Completed, signed off  If discussed at Long Length of Stay Meetings, dates discussed:    Additional Comments:  05/14/17- 1120- Addilee Neu RN, CM- pt for d/c today- f/u done with pt regarding Brilinta- pt has copay assist card to use on discharge and understands her copay of $82 with her insurance without copay card.- per pt she uses [facility]. No further CM needs noted for discharge.   Cherylann ParrClaxton, Samantha S, RN 05/13/2017, 11:59 AM--Benefit check submitted for Brilinta - prior Berkley Harveyauth is not required   Darrold SpanWebster, Zacharias Ridling Hall, RN 05/14/2017, 11:34 AM [phone]

## 2017-05-15 ENCOUNTER — Telehealth (HOSPITAL_COMMUNITY): Payer: Self-pay

## 2017-05-15 NOTE — Telephone Encounter (Signed)
Patients insurance is active and benefits verified through [employer] - No co-pay, deductible amount of $1,250/$1,250 has been met, out of pocket amount of $5,750/$1,821.47 has been met, 20% co-insurance, and no pre-authorization is required. Passport/reference [id number]  Patient will be contacted and scheduled after their follow up appt with the Cardiologist office upon review by the RN Navigator.

## 2017-05-20 ENCOUNTER — Encounter: Payer: Self-pay | Admitting: Cardiology

## 2017-05-20 ENCOUNTER — Encounter: Payer: Self-pay | Admitting: *Deleted

## 2017-05-20 ENCOUNTER — Ambulatory Visit: Payer: BLUE CROSS/BLUE SHIELD | Admitting: Cardiology

## 2017-05-20 VITALS — BP 173/98 | HR 65 | Ht 66.0 in | Wt 97.8 lb

## 2017-05-20 DIAGNOSIS — Z951 Presence of aortocoronary bypass graft: Secondary | ICD-10-CM

## 2017-05-20 DIAGNOSIS — F17200 Nicotine dependence, unspecified, uncomplicated: Secondary | ICD-10-CM

## 2017-05-20 DIAGNOSIS — I10 Essential (primary) hypertension: Secondary | ICD-10-CM

## 2017-05-20 DIAGNOSIS — I2102 ST elevation (STEMI) myocardial infarction involving left anterior descending coronary artery: Secondary | ICD-10-CM

## 2017-05-20 DIAGNOSIS — I255 Ischemic cardiomyopathy: Secondary | ICD-10-CM | POA: Diagnosis not present

## 2017-05-20 DIAGNOSIS — Z9861 Coronary angioplasty status: Secondary | ICD-10-CM

## 2017-05-20 DIAGNOSIS — I771 Stricture of artery: Secondary | ICD-10-CM | POA: Diagnosis not present

## 2017-05-20 DIAGNOSIS — E785 Hyperlipidemia, unspecified: Secondary | ICD-10-CM | POA: Diagnosis not present

## 2017-05-20 DIAGNOSIS — I2510 Atherosclerotic heart disease of native coronary artery without angina pectoris: Secondary | ICD-10-CM | POA: Diagnosis not present

## 2017-05-20 DIAGNOSIS — Z905 Acquired absence of kidney: Secondary | ICD-10-CM

## 2017-05-20 DIAGNOSIS — I739 Peripheral vascular disease, unspecified: Secondary | ICD-10-CM | POA: Diagnosis not present

## 2017-05-20 MED ORDER — AMLODIPINE BESYLATE 10 MG PO TABS: 10.0000 mg | ORAL_TABLET | Freq: Every day | ORAL | 3 refills | Status: DC

## 2017-05-20 NOTE — Progress Notes (Signed)
05/20/2017 [name]   05/02/1951  [id number]  Primary Physician [name], Jamie B, PA-C Primary Cardiologist: Jamie   HPI:  66 y/o thin female (97 lbs!). 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 She has a history of CAD and PVD. She had CABG x 1 in 1996 with an LIMA- to her LAD and a Lt common carotid to Lt subclavian bypass by Jamie Jamie CookeyEarly in 2008. Other problems include HTN, HLD, and smoking. She presented 05/12/17 with an anterior STEMI after she developed chest pain at work and called EMS. Cath revealed high grade LAD disease after the LIMA insertion and proximal RI disease. She underwent PCI with DES to both sites. Troponin peaked at 2.76. Her EF by echo was 45%. She was discharge 05/14/17 and is in the office today for follow up. She has done well, no chest pain. She is now vaping to try and wen herself off cigarettes.    Current Outpatient Medications  Medication Sig Dispense Refill  . alendronate (FOSAMAX) 70 MG tablet TAKE 1 TABLET BY MOUTH ONCE WEEKLY TAKE WITH A FULL GLASS OF WATER ON AN EMPTY STOMACH 12 tablet 3  . ALPRAZolam (XANAX) 0.25 MG tablet TAKE 1 TABLET EVERY DAY 30 tablet 2  . amLODipine (NORVASC) 10 MG tablet Take 1 tablet (10 mg total) by mouth daily. 90 tablet 3  . aspirin 81 MG tablet Take 81 mg by mouth daily.    Marland Kitchen. atorvastatin (LIPITOR) 40 MG tablet Take 2 tablets (80 mg total) by mouth daily. 60 tablet 1  . carvedilol (COREG) 3.125 MG tablet Take 1 tablet (3.125 mg total) by mouth 2 (two) times daily with a meal. 60 tablet 2  . Cholecalciferol (VITAMIN D3) 2000 units capsule Take 1 capsule (2,000 Units total) by mouth daily.    . nitroGLYCERIN (NITROSTAT) 0.4 MG SL tablet PLACE 1 TABLET ON THE TONGUE &LET DISSOLVE EVERY 5 MINS AS NEEDED FOR CHEST PAIN 25 tablet 0  . ticagrelor (BRILINTA) 90 MG TABS tablet Take 1 tablet (90 mg total) by mouth 2 (two) times daily. 180 tablet 1  . triamcinolone cream (KENALOG) 0.1 % APPLY 1 APPLICATION TOPICALLY 2 (TWO) TIMES DAILY. 30 g 0    No current facility-administered medications for this visit.     No Known Allergies  Past Medical History:  Diagnosis Date  . Allergy   . COPD ([name] obstructive pulmonary disease) (HCC)   . Coronary artery disease 05/16/1995   Cathed in November, 05/20/1995--CABG x 1-----LIMA to LAD, 11/18 STEMI PCI/DES pLAD, EF 45-50%  . Hyperlipidemia   . Hypertension   . Myocardial infarction (HCC)   . Nephrolithiasis    Left Nephrectomy   . Smoker   . Stroke [name](HCC)     Social History   Socioeconomic History  . Marital status: Married    Spouse name: Not on file  . Number of children: 2  . Years of education: Not on file  . Highest education level: Not on file  Social Needs  . Financial resource strain: Not on file  . Food insecurity - worry: Not on file  . Food insecurity - inability: Not on file  . Transportation needs - medical: Not on file  . Transportation needs - non-medical: Not on file  Occupational History  . Not on file  Tobacco Use  . Smoking status: Current Every Day Smoker    Packs/day: 0.25    Years: 40.00    Pack years: 10.00    Types: Cigarettes  .  Smokeless tobacco: Never Used  Substance and Sexual Activity  . Alcohol use: No  . Drug use: No  . Sexual activity: Yes  Other Topics Concern  . Not on file  Social History Narrative   Lives alone.       Family History  Problem Relation Age of Onset  . Depression Sister   . COPD Father   . Cancer Sister        hysterectomy sec to cancer  . Cancer Sister        cervical cancer  . COPD Brother      Review of Systems: General: negative for chills, fever, night sweats or weight changes.  Cardiovascular: negative for chest pain, dyspnea on exertion, edema, orthopnea, palpitations, paroxysmal nocturnal dyspnea or shortness of breath Dermatological: negative for rash Respiratory: negative for cough or wheezing Urologic: negative for hematuria Abdominal: negative for nausea, vomiting, diarrhea, bright  red blood per rectum, melena, or hematemesis Neurologic: negative for visual changes, syncope, or dizziness All other systems reviewed and are otherwise negative except as noted above.    Blood pressure (!) 173/98, pulse 65, height 5\' 6"  (1.676 m), weight 97 lb 12.8 oz (44.4 kg).  General appearance: alert, cooperative, cachectic and no distress Lungs: decreased breath sounds Heart: regular rate and rhythm Extremities: Rt groin ecchymotic, pea sized knot, positive bruit in both groins Neurologic: Grossly normal  EKG NSR, diffuse TWI  ASSESSMENT AND PLAN:   STEMI involving left anterior descending coronary artery (HCC) Anterior STEMI 05/12/17- urgent LAD and RI PCI with DES  CAD S/P percutaneous coronary angioplasty Urgent PCI with DES to the native LAD after the LIMA insertion, and PCI with DES to the RI 05/12/17  Essential hypertension Poor control- 150/70 by me, increase Norvasc  Ischemic cardiomyopathy EF 45% post MI Nov 2018  Hx of CABG CABG x 1 1996-LIMA-LAD  Subclavian artery stenosis, left (HCC) Carotid to Lt subclavian artery bypass 2008 by Jamie Early  Solitary kidney, 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  Secondary to StagHorn Calculus, Nonfunctioning of the left kidney, Acute and [name]   Dyslipidemia LDL was 70- statin increased   PLAN  I suggested she increase her Norvasc to 10 mg daily. She can return to work Dec 15th but no heavy lifting.  Check lipids and LFTs on increased statin Rx in 6-8 weeks, f/u [name] in 3 months.   Corine ShelterLuke Jayce Boyko PA-C 05/20/2017 11:12 AM

## 2017-05-20 NOTE — Assessment & Plan Note (Signed)
CABG x 1 1996-LIMA-LAD

## 2017-05-20 NOTE — Assessment & Plan Note (Signed)
Poor control- 150/70 by me, increase Norvasc

## 2017-05-20 NOTE — Assessment & Plan Note (Signed)
Anterior STEMI 05/12/17- urgent LAD and RI PCI with DES

## 2017-05-20 NOTE — Assessment & Plan Note (Signed)
10/17/1983---Left Nephrectomy--Dr. Etta GrandchildSural.  Secondary to StagHorn Calculus, Nonfunctioning of the left kidney, Acute and Chronic

## 2017-05-20 NOTE — Patient Instructions (Signed)
Medication Instructions:   INCREASE AMLODIPINE TO 10 MG ONCE DAILY= 2 OF THE 5 MG TABLETS ONCE DAILY  Labwork:  Your physician recommends that you return for lab work in: 2 MONTHS PRIOR TO EATING  Follow-Up:  Your physician recommends that you schedule a follow-up appointment in: 1 WEEKS IN THE HYPERTENSION CLINIC  Your physician recommends that you schedule a follow-up appointment in: 3 MON[facility]    If you need a refill on your cardiac medications before your next appointment, please call your pharmacy.

## 2017-05-20 NOTE — Assessment & Plan Note (Signed)
Carotid to Lt subclavian artery bypass 2008 by Dr Arbie CookeyEarly

## 2017-05-20 NOTE — Assessment & Plan Note (Signed)
Urgent PCI with DES to the native LAD after the LIMA insertion, and PCI with DES to the RI 05/12/17

## 2017-05-20 NOTE — Assessment & Plan Note (Signed)
EF 45% post MI Nov 2018

## 2017-05-20 NOTE — Assessment & Plan Note (Signed)
LDL was 70- statin increased

## 2017-05-21 ENCOUNTER — Ambulatory Visit: Payer: BLUE CROSS/BLUE SHIELD | Admitting: Cardiology

## 2017-05-22 ENCOUNTER — Telehealth (HOSPITAL_COMMUNITY): Payer: Self-pay

## 2017-05-22 NOTE — Telephone Encounter (Signed)
Attempted to call patient in regards to Cardiac Rehab - Lm on Vm °

## 2017-05-28 NOTE — Progress Notes (Deleted)
     05/28/2017 Jamie Abbottarolyn Monfils 06/02/1951 [id number]   HPI:  [name] is a 66 y.o. female patient of Dr Antoine PocheHochrein with a PMH below who presents today for hypertension clinic evaluation.  In addition to hypertension she has a medical history significant for CAD (CABG x 1 in 1996, then bypass common carotid to L subclavian in 2008), PAD, hyperlipidemia and smoking.   Just last month she presented to ER with anterior STEMI and had PCI with DES x 2 (with LDL of 67).  She has been vaping while trying to wean off cigarettes.    Blood Pressure Goal:  130/80  Current Medications:  Amlodipine 10 mg daily  Carvedilol 3.125 mg twice daily   Cardiac Hx:  Family Hx:  Social Hx:  Diet:  Exercise:  Home BP readings:  Intolerances:   Labs:   04/2017:  Na 138, K 4.4, Glu 98, BUN 13, SCr 1.0  Wt Readings from Last 3 Encounters:  05/20/17 97 lb 12.8 oz (44.4 kg)  05/12/17 100 lb (45.4 kg)  11/13/16 100 lb (45.4 kg)   BP Readings from Last 3 Encounters:  05/20/17 (!) 173/98  05/14/17 (!) 141/56  11/13/16 110/60   Pulse Readings from Last 3 Encounters:  05/20/17 65  05/14/17 69  11/13/16 86    Current Outpatient Medications  Medication Sig Dispense Refill  . alendronate (FOSAMAX) 70 MG tablet TAKE 1 TABLET BY MOUTH ONCE WEEKLY TAKE WITH A FULL GLASS OF WATER ON AN EMPTY STOMACH 12 tablet 3  . ALPRAZolam (XANAX) 0.25 MG tablet TAKE 1 TABLET EVERY DAY 30 tablet 2  . amLODipine (NORVASC) 10 MG tablet Take 1 tablet (10 mg total) by mouth daily. 90 tablet 3  . aspirin 81 MG tablet Take 81 mg by mouth daily.    Marland Kitchen. atorvastatin (LIPITOR) 40 MG tablet Take 2 tablets (80 mg total) by mouth daily. 60 tablet 1  . carvedilol (COREG) 3.125 MG tablet Take 1 tablet (3.125 mg total) by mouth 2 (two) times daily with a meal. 60 tablet 2  . Cholecalciferol (VITAMIN D3) 2000 units capsule Take 1 capsule (2,000 Units total) by mouth daily.    . nitroGLYCERIN (NITROSTAT) 0.4 MG SL tablet PLACE 1  TABLET ON THE TONGUE &LET DISSOLVE EVERY 5 MINS AS NEEDED FOR CHEST PAIN 25 tablet 0  . ticagrelor (BRILINTA) 90 MG TABS tablet Take 1 tablet (90 mg total) by mouth 2 (two) times daily. 180 tablet 1  . triamcinolone cream (KENALOG) 0.1 % APPLY 1 APPLICATION TOPICALLY 2 (TWO) TIMES DAILY. 30 g 0   No current facility-administered medications for this visit.     No Known Allergies  Past Medical History:  Diagnosis Date  . Allergy   . COPD (chronic obstructive pulmonary disease) (HCC)   . Coronary artery disease 05/16/1995   Cathed in November, 05/20/1995--CABG x 1-----LIMA to LAD, 11/18 STEMI PCI/DES pLAD, EF 45-50%  . Hyperlipidemia   . Hypertension   . Myocardial infarction (HCC)   . Nephrolithiasis    Left Nephrectomy   . Smoker   . Stroke [facility](HCC)     There were no vitals taken for this visit.  No problem-specific Assessment & Plan notes found for this encounter.   Phillips HayKristin Elita Dame PharmD CPP [facility] [address] [place], [place] [address] [phone]

## 2017-05-29 ENCOUNTER — Ambulatory Visit: Payer: BLUE CROSS/BLUE SHIELD

## 2017-06-03 ENCOUNTER — Telehealth (HOSPITAL_COMMUNITY): Payer: Self-pay

## 2017-06-03 ENCOUNTER — Encounter (HOSPITAL_COMMUNITY): Payer: Self-pay

## 2017-06-03 NOTE — Telephone Encounter (Signed)
2nd attempt to call patient in regards to Cardiac Rehab - Lm on Vm. Sending letter. °

## 2017-06-05 ENCOUNTER — Telehealth (HOSPITAL_COMMUNITY): Payer: Self-pay

## 2017-06-05 NOTE — Telephone Encounter (Signed)
Patient returned phone call late yesterday after noon - Attempted to return patient's call. Lm on Vm

## 2017-06-12 ENCOUNTER — Telehealth (HOSPITAL_COMMUNITY): Payer: Self-pay

## 2017-06-12 NOTE — Telephone Encounter (Signed)
Called and spoke with patient in regards to Cardiac Rehab - Patient is currently exercising at a gym and not interested in participating in our program. Closed referral.

## 2017-07-21 ENCOUNTER — Encounter: Payer: Self-pay | Admitting: Physician Assistant

## 2017-07-21 ENCOUNTER — Ambulatory Visit: Payer: BLUE CROSS/BLUE SHIELD | Admitting: Physician Assistant

## 2017-07-21 ENCOUNTER — Other Ambulatory Visit: Payer: Self-pay

## 2017-07-21 VITALS — BP 138/84 | HR 59 | Temp 97.7°F | Resp 14 | Wt 97.4 lb

## 2017-07-21 DIAGNOSIS — R52 Pain, unspecified: Secondary | ICD-10-CM | POA: Diagnosis not present

## 2017-07-21 DIAGNOSIS — J029 Acute pharyngitis, unspecified: Secondary | ICD-10-CM | POA: Diagnosis not present

## 2017-07-21 DIAGNOSIS — J988 Other specified respiratory disorders: Secondary | ICD-10-CM

## 2017-07-21 DIAGNOSIS — B9689 Other specified bacterial agents as the cause of diseases classified elsewhere: Secondary | ICD-10-CM

## 2017-07-21 LAB — INFLUENZA A AND B AG, IMMUNOASSAY
INFLUENZA A ANTIGEN: NOT DETECTED
INFLUENZA B ANTIGEN: NOT DETECTED

## 2017-07-21 MED ORDER — AZITHROMYCIN 250 MG PO TABS: ORAL_TABLET | ORAL | 0 refills | Status: DC

## 2017-07-21 NOTE — Progress Notes (Signed)
Patient ID: Jamie AbbottCarolyn [name] MRN: [id number], DOB: 11/28/1950, 67 y.o. Date of Encounter: 07/21/2017, 12:28 PM    Chief Complaint:  Chief Complaint  Patient presents with  . Congestion  . Sore Throat    x1day  .          HPI: 67 y.o. year old female presents with above.   She reports that she has been having this congestion for a couple weeks now.  She has been having congestion in her head and nose but also is having a lot of chest congestion and congested cough.  Her throat has been sore.  Says that she called her job and told him she could not come into work today that there was no way she could climb up and down ladders with this chest congestion and cough. No known fevers. During office visit today, she does have very congested cough.     Home Meds:   Outpatient Medications Prior to Visit  Medication Sig Dispense Refill  . alendronate (FOSAMAX) 70 MG tablet TAKE 1 TABLET BY MOUTH ONCE WEEKLY TAKE WITH A FULL GLASS OF WATER ON AN EMPTY STOMACH 12 tablet 3  . ALPRAZolam (XANAX) 0.25 MG tablet TAKE 1 TABLET EVERY DAY 30 tablet 2  . amLODipine (NORVASC) 10 MG tablet Take 1 tablet (10 mg total) by mouth daily. 90 tablet 3  . aspirin 81 MG tablet Take 81 mg by mouth daily.    Marland Kitchen. atorvastatin (LIPITOR) 40 MG tablet Take 2 tablets (80 mg total) by mouth daily. 60 tablet 1  . carvedilol (COREG) 3.125 MG tablet Take 1 tablet (3.125 mg total) by mouth 2 (two) times daily with a meal. 60 tablet 2  . Cholecalciferol (VITAMIN D3) 2000 units capsule Take 1 capsule (2,000 Units total) by mouth daily.    . nitroGLYCERIN (NITROSTAT) 0.4 MG SL tablet PLACE 1 TABLET ON THE TONGUE &LET DISSOLVE EVERY 5 MINS AS NEEDED FOR CHEST PAIN 25 tablet 0  . ticagrelor (BRILINTA) 90 MG TABS tablet Take 1 tablet (90 mg total) by mouth 2 (two) times daily. 180 tablet 1  . triamcinolone cream (KENALOG) 0.1 % APPLY 1 APPLICATION TOPICALLY 2 (TWO) TIMES DAILY. 30 g 0   No facility-administered medications prior  to visit.     Allergies: No Known Allergies    Review of Systems: See HPI for pertinent ROS. All other ROS negative.    Physical Exam: Blood pressure 138/84, pulse (!) 59, temperature 97.7 F (36.5 C), temperature source Oral, resp. rate 14, weight 44.2 kg (97 lb 6.4 oz), SpO2 93 %., Body mass index is 15.72 kg/m. General:  WF. Appears in no acute distress. HEENT: Normocephalic, atraumatic, eyes without discharge, sclera non-icteric, nares are without discharge. Bilateral auditory canals clear, TM's are without perforation, pearly grey and translucent with reflective cone of light bilaterally. Oral cavity moist, posterior pharynx without exudate, erythema, peritonsillar abscess, or post nasal drip.  No tenderness with percussion to frontal or maxillary sinuses bilaterally.  Neck: Supple. No thyromegaly. No lymphadenopathy. Lungs: Clear bilaterally to auscultation without wheezes, rales, or rhonchi. Breathing is unlabored.  During the visit she does have cough that sounds very congested.  However, pulmonary exam sounds clear--- I hear no wheezing at all. Heart: Regular rhythm. No murmurs, rubs, or gallops. Msk:  Strength and tone normal for age. Extremities/Skin: Warm and dry. Neuro: Alert and oriented X 3. Moves all extremities spontaneously. Gait is normal. CNII-XII grossly in tact. Psych:  Responds to questions appropriately with  a normal affect.   Results for orders placed or performed in visit on 07/21/17  STREP GROUP A AG, W/REFLEX TO CULT  Result Value Ref Range   Streptococcus, Group A Screen (Direct) NONE DETECTED   Influenza A and B Ag, Immunoassay  Result Value Ref Range   Source: NASOPHARYNX    INFLUENZA A ANTIGEN NOT DETECTED NOT DETECT   INFLUENZA B ANTIGEN NOT DETECTED NOT DETECT   COMMENT:       ASSESSMENT AND PLAN:  67 y.o. year old female with  1. Bacterial respiratory infection She is to take antibiotic as directed.  Also recommend that she use Mucinex DM as  expectorant.  Note given for out of work today and tomorrow with plans to return Wednesday, 07/23/17.   Follow up if symptoms worsen or if symptoms do not resolve within 1 week after completion of antibiotic. - azithromycin (ZITHROMAX) 250 MG tablet; Day 1: Take 2 daily.  Days 2 - 5: Take 1 daily.  Dispense: 6 tablet; Refill: 0  2. Sore throat - STREP GROUP A AG, W/REFLEX TO CULT  3. Generalized body aches - Influenza A and B Ag, Immunoassay   Signed, [address] [place], [place], [facility] 07/21/2017 12:28 PM

## 2017-07-23 ENCOUNTER — Emergency Department (HOSPITAL_COMMUNITY): Payer: BLUE CROSS/BLUE SHIELD

## 2017-07-23 ENCOUNTER — Telehealth: Payer: Self-pay | Admitting: Cardiology

## 2017-07-23 ENCOUNTER — Emergency Department (HOSPITAL_COMMUNITY)
Admission: EM | Admit: 2017-07-23 | Discharge: 2017-07-23 | Disposition: A | Discharge status: Home / Self Care | Payer: BLUE CROSS/BLUE SHIELD | Attending: Emergency Medicine | Admitting: Emergency Medicine

## 2017-07-23 ENCOUNTER — Encounter (HOSPITAL_COMMUNITY): Payer: Self-pay

## 2017-07-23 DIAGNOSIS — K229 Disease of esophagus, unspecified: Secondary | ICD-10-CM | POA: Diagnosis not present

## 2017-07-23 DIAGNOSIS — K2289 Other specified disease of esophagus: Secondary | ICD-10-CM

## 2017-07-23 DIAGNOSIS — J441 Chronic obstructive pulmonary disease with (acute) exacerbation: Secondary | ICD-10-CM | POA: Diagnosis not present

## 2017-07-23 DIAGNOSIS — N3000 Acute cystitis without hematuria: Secondary | ICD-10-CM | POA: Diagnosis not present

## 2017-07-23 DIAGNOSIS — Z79899 Other long term (current) drug therapy: Secondary | ICD-10-CM | POA: Diagnosis not present

## 2017-07-23 DIAGNOSIS — Z7982 Long term (current) use of aspirin: Secondary | ICD-10-CM | POA: Insufficient documentation

## 2017-07-23 DIAGNOSIS — F17210 Nicotine dependence, cigarettes, uncomplicated: Secondary | ICD-10-CM | POA: Diagnosis not present

## 2017-07-23 DIAGNOSIS — I2510 Atherosclerotic heart disease of native coronary artery without angina pectoris: Secondary | ICD-10-CM | POA: Diagnosis not present

## 2017-07-23 DIAGNOSIS — I119 Hypertensive heart disease without heart failure: Secondary | ICD-10-CM | POA: Insufficient documentation

## 2017-07-23 DIAGNOSIS — K228 Other specified diseases of esophagus: Secondary | ICD-10-CM

## 2017-07-23 DIAGNOSIS — R0602 Shortness of breath: Secondary | ICD-10-CM | POA: Diagnosis present

## 2017-07-23 LAB — CBC WITH DIFFERENTIAL/PLATELET
Basophils Absolute: 0.1 10*3/uL (ref 0.0–0.1)
Basophils Relative: 0 %
EOS PCT: 4 %
Eosinophils Absolute: 0.4 10*3/uL (ref 0.0–0.7)
HCT: 35.5 % — ABNORMAL LOW (ref 36.0–46.0)
Hemoglobin: 11.7 g/dL — ABNORMAL LOW (ref 12.0–15.0)
LYMPHS ABS: 2.3 10*3/uL (ref 0.7–4.0)
LYMPHS PCT: 20 %
MCH: 31.7 pg (ref 26.0–34.0)
MCHC: 33 g/dL (ref 30.0–36.0)
MCV: 96.2 fL (ref 78.0–100.0)
MONO ABS: 0.6 10*3/uL (ref 0.1–1.0)
MONOS PCT: 5 %
Neutro Abs: 8.3 10*3/uL — ABNORMAL HIGH (ref 1.7–7.7)
Neutrophils Relative %: 71 %
PLATELETS: 339 10*3/uL (ref 150–400)
RBC: 3.69 MIL/uL — ABNORMAL LOW (ref 3.87–5.11)
RDW: 14.9 % (ref 11.5–15.5)
WBC: 11.7 10*3/uL — ABNORMAL HIGH (ref 4.0–10.5)

## 2017-07-23 LAB — D-DIMER, QUANTITATIVE: D-Dimer, Quant: 2.53 ug/mL-FEU — ABNORMAL HIGH (ref 0.00–0.50)

## 2017-07-23 LAB — COMPREHENSIVE METABOLIC PANEL
ALT: 18 U/L (ref 14–54)
AST: 28 U/L (ref 15–41)
Albumin: 3.3 g/dL — ABNORMAL LOW (ref 3.5–5.0)
Alkaline Phosphatase: 62 U/L (ref 38–126)
Anion gap: 13 (ref 5–15)
BILIRUBIN TOTAL: 0.5 mg/dL (ref 0.3–1.2)
BUN: 24 mg/dL — AB (ref 6–20)
CHLORIDE: 105 mmol/L (ref 101–111)
CO2: 20 mmol/L — ABNORMAL LOW (ref 22–32)
Calcium: 8.9 mg/dL (ref 8.9–10.3)
Creatinine, Ser: 1.08 mg/dL — ABNORMAL HIGH (ref 0.44–1.00)
GFR calc Af Amer: >60 mL/min (ref >60)
GFR calc non Af Amer: 53 mL/min — ABNORMAL LOW (ref >60)
GLUCOSE: 102 mg/dL — AB (ref 65–99)
POTASSIUM: 4.1 mmol/L (ref 3.5–5.1)
Sodium: 138 mmol/L (ref 135–145)
TOTAL PROTEIN: 6.1 g/dL — AB (ref 6.5–8.1)

## 2017-07-23 LAB — CULTURE, GROUP A STREP
MICRO NUMBER: 90146838
SPECIMEN QUALITY: ADEQUATE

## 2017-07-23 LAB — URINALYSIS, ROUTINE W REFLEX MICROSCOPIC
Bilirubin Urine: NEGATIVE
GLUCOSE, UA: NEGATIVE mg/dL
Hgb urine dipstick: NEGATIVE
Ketones, ur: NEGATIVE mg/dL
Nitrite: NEGATIVE
PH: 5 (ref 5.0–8.0)
PROTEIN: NEGATIVE mg/dL
Specific Gravity, Urine: 1.005 (ref 1.005–1.030)

## 2017-07-23 LAB — STREP GROUP A AG, W/REFLEX TO CULT: Streptococcus, Group A Screen (Direct): NOT DETECTED

## 2017-07-23 LAB — I-STAT CG4 LACTIC ACID, ED: Lactic Acid, Venous: 0.44 mmol/L — ABNORMAL LOW (ref 0.5–1.9)

## 2017-07-23 LAB — I-STAT TROPONIN, ED: TROPONIN I, POC: 0 ng/mL (ref 0.00–0.08)

## 2017-07-23 MED ORDER — IOPAMIDOL (ISOVUE-370) INJECTION 76%
INTRAVENOUS | Status: AC
  Administered 2017-07-23: 80 mL
  Filled 2017-07-23: qty 100

## 2017-07-23 MED ORDER — PREDNISONE 20 MG PO TABS: ORAL_TABLET | ORAL | 0 refills | Status: DC

## 2017-07-23 MED ORDER — IPRATROPIUM-ALBUTEROL 0.5-2.5 (3) MG/3ML IN SOLN
3.0000 mL | Freq: Once | RESPIRATORY_TRACT | Status: AC
  Administered 2017-07-23: 3 mL via RESPIRATORY_TRACT
  Filled 2017-07-23: qty 3

## 2017-07-23 MED ORDER — PREDNISONE 20 MG PO TABS
60.0000 mg | ORAL_TABLET | Freq: Once | ORAL | Status: AC
  Administered 2017-07-23: 60 mg via ORAL
  Filled 2017-07-23: qty 3

## 2017-07-23 MED ORDER — ALBUTEROL SULFATE HFA 108 (90 BASE) MCG/ACT IN AERS
2.0000 | INHALATION_SPRAY | RESPIRATORY_TRACT | Status: DC
  Filled 2017-07-23: qty 6.7

## 2017-07-23 MED ORDER — CEPHALEXIN 500 MG PO CAPS
500.0000 mg | ORAL_CAPSULE | Freq: Two times a day (BID) | ORAL | 0 refills | Status: DC
Start: 2017-07-23 — End: 2018-06-22

## 2017-07-23 NOTE — ED Notes (Signed)
Patient transported to CT 

## 2017-07-23 NOTE — ED Provider Notes (Signed)
[facility] EMERGENCY DEPARTMENT Provider Note   CSN: [id number] Arrival date & time: 07/23/17  4098     History   Chief Complaint No chief complaint on file.   HPI [name] is a 67 y.o. female.  Patient is a 68 year old female with a history of coronary artery disease status post stent placement, COPD, hypertension and hyperlipidemia.  She reports a one-week history of shortness of breath.  She has had a cough which is productive of some white yellow sputum.  She has pain in her chest but only with coughing.  She saw her PCP 2 days ago and was started on Zithromax.  She denies any nausea or vomiting.  No known fevers.  No leg swelling.  She does not use inhalers at home.  She is not on oxygen at home.  He states she can only walk short due to his without getting short of breath.      Past Medical History:  Diagnosis Date  . Allergy   . COPD (chronic obstructive pulmonary disease) (HCC)   . Coronary artery disease 05/16/1995   Cathed in November, 05/20/1995--CABG x 1-----LIMA to LAD, 11/18 STEMI PCI/DES pLAD, EF 45-50%  . Hyperlipidemia   . Hypertension   . Myocardial infarction (HCC)   . Nephrolithiasis    Left Nephrectomy   . Smoker   . Stroke [facility])     Patient Active Problem List   Diagnosis Date Noted  . Ischemic cardiomyopathy 05/20/2017  . STEMI involving left anterior descending coronary artery (HCC) 05/12/2017  . Medication management 10/24/2016  . CAD S/P percutaneous coronary angioplasty 10/24/2016  . PVD (peripheral vascular disease) (HCC) 10/24/2016  . Carotid bruit present 03/12/2016  . Essential hypertension 05/29/2015  . COPD (chronic obstructive pulmonary disease) (HCC) 02/01/2015  . Solitary kidney, acquired 02/01/2015  . History of nephrolithiasis 02/01/2015  . Hx of CABG 01/30/2015  . Dyslipidemia 01/30/2015  . Smoker 01/30/2015  . Vitamin D deficiency 01/30/2015  . Osteoporosis 01/30/2015  . Subclavian artery stenosis,  left (HCC) 01/30/2015    Past Surgical History:  Procedure Laterality Date  . CAROTID ARTERY - SUBCLAVIAN ARTERY BYPASS GRAFT Left 04/18/2007  . CORONARY ARTERY BYPASS GRAFT    . CORONARY/GRAFT ACUTE MI REVASCULARIZATION N/A 05/12/2017   Procedure: Coronary/Graft Acute MI Revascularization;  Surgeon: Tonny Bollman, MD;  Location: [facility] INVASIVE CV LAB;  Service: Cardiovascular;  Laterality: N/A;  . KIDNEY SURGERY Left    67 yo  . LEFT HEART CATH AND CORONARY ANGIOGRAPHY N/A 05/12/2017   Procedure: LEFT HEART CATH AND CORONARY ANGIOGRAPHY;  Surgeon: Tonny Bollman, MD;  Location: [facility] INVASIVE CV LAB;  Service: Cardiovascular;  Laterality: N/A;  . Left Nephrectomy Left 10/17/1983   Dr. Sural---StagHornCalculus, Left Kidney Nonfunctioning wiht Acute and Chronic Pyelonephritis    OB History    No data available       Home Medications    Prior to Admission medications   Medication Sig Start Date End Date Taking? Authorizing Provider  alendronate (FOSAMAX) 70 MG tablet TAKE 1 TABLET BY MOUTH ONCE WEEKLY TAKE WITH A FULL GLASS OF WATER ON AN EMPTY STOMACH 04/21/17   Dorena Bodo, PA-C  ALPRAZolam Prudy Feeler) 0.25 MG tablet TAKE 1 TABLET EVERY DAY 02/19/17   Allayne Butcher B, PA-C  amLODipine (NORVASC) 10 MG tablet Take 1 tablet (10 mg total) by mouth daily. 05/20/17 08/18/17  Abelino Derrick, PA-C  aspirin 81 MG tablet Take 81 mg by mouth daily.    [provider]  atorvastatin (LIPITOR) 40 MG tablet Take 2 tablets (80 mg total) by mouth daily. 05/14/17   Arty Baumgartneroberts, Lindsay B, NP  azithromycin (ZITHROMAX) 250 MG tablet Day 1: Take 2 daily.  Days 2 - 5: Take 1 daily. 07/21/17   Dorena Bodoixon, Mary B, PA-C  carvedilol (COREG) 3.125 MG tablet Take 1 tablet (3.125 mg total) by mouth 2 (two) times daily with a meal. 05/14/17   Arty Baumgartneroberts, Lindsay B, NP  cephALEXin (KEFLEX) 500 MG capsule Take 1 capsule (500 mg total) by mouth 2 (two) times daily. 07/23/17   Rolan BuccoBelfi, Deosha Werden, MD  Cholecalciferol (VITAMIN D3) 2000 units  capsule Take 1 capsule (2,000 Units total) by mouth daily. 11/16/15   Dorena Bodoixon, Mary B, PA-C  nitroGLYCERIN (NITROSTAT) 0.4 MG SL tablet PLACE 1 TABLET ON THE TONGUE &LET DISSOLVE EVERY 5 MINS AS NEEDED FOR CHEST PAIN 04/24/15   Dorena Bodoixon, Mary B, PA-C  predniSONE (DELTASONE) 20 MG tablet 2 tabs po daily x 4 days 07/23/17   Rolan BuccoBelfi, Carley Glendenning, MD  ticagrelor (BRILINTA) 90 MG TABS tablet Take 1 tablet (90 mg total) by mouth 2 (two) times daily. 05/14/17   Arty Baumgartneroberts, Lindsay B, NP  triamcinolone cream (KENALOG) 0.1 % APPLY 1 APPLICATION TOPICALLY 2 (TWO) TIMES DAILY. 06/18/16   Dorena Bodoixon, Mary B, PA-C    Family History Family History  Problem Relation Age of Onset  . Depression Sister   . COPD Father   . Cancer Sister        hysterectomy sec to cancer  . Cancer Sister        cervical cancer  . COPD Brother     Social History Social History   Tobacco Use  . Smoking status: Current Every Day Smoker    Packs/day: 0.25    Years: 40.00    Pack years: 10.00    Types: Cigarettes  . Smokeless tobacco: Never Used  Substance Use Topics  . Alcohol use: No  . Drug use: No     Allergies   Patient has no known allergies.   Review of Systems Review of Systems  Constitutional: Negative for chills, diaphoresis, fatigue and fever.  HENT: Negative for congestion, rhinorrhea and sneezing.   Eyes: Negative.   Respiratory: Positive for cough and shortness of breath. Negative for chest tightness.   Cardiovascular: Positive for chest pain. Negative for leg swelling.  Gastrointestinal: Negative for abdominal pain, blood in stool, diarrhea, nausea and vomiting.  Genitourinary: Negative for difficulty urinating, flank pain, frequency and hematuria.  Musculoskeletal: Negative for arthralgias and back pain.  Skin: Negative for rash.  Neurological: Negative for dizziness, speech difficulty, weakness, numbness and headaches.     Physical Exam Updated Vital Signs BP 102/61   Pulse (!) 55   Temp 97.6 F (36.4 C)  (Oral)   Resp 19   SpO2 92%   Physical Exam  Constitutional: She is oriented to person, place, and time. She appears well-developed and well-nourished.  HENT:  Head: Normocephalic and atraumatic.  Eyes: Pupils are equal, round, and reactive to light.  Neck: Normal range of motion. Neck supple.  Cardiovascular: Normal rate, regular rhythm and normal heart sounds.  Pulmonary/Chest: Effort normal and breath sounds normal. No respiratory distress. She has no wheezes. She has no rales. She exhibits no tenderness.  Diminished breath sounds bilaterally  Abdominal: Soft. Bowel sounds are normal. There is no tenderness. There is no rebound and no guarding.  Musculoskeletal: Normal range of motion. She exhibits no edema.  Lymphadenopathy:    She  has no cervical adenopathy.  Neurological: She is alert and oriented to person, place, and time.  Skin: Skin is warm and dry. No rash noted.  Psychiatric: She has a normal mood and affect.     ED Treatments / Results  Labs (all labs ordered are listed, but only abnormal results are displayed) Labs Reviewed  COMPREHENSIVE METABOLIC PANEL - Abnormal; Notable for the following components:      Result Value   CO2 20 (*)    Glucose, Bld 102 (*)    BUN 24 (*)    Creatinine, Ser 1.08 (*)    Total Protein 6.1 (*)    Albumin 3.3 (*)    GFR calc non Af Amer 53 (*)    All other components within normal limits  CBC WITH DIFFERENTIAL/PLATELET - Abnormal; Notable for the following components:   WBC 11.7 (*)    RBC 3.69 (*)    Hemoglobin 11.7 (*)    HCT 35.5 (*)    Neutro Abs 8.3 (*)    All other components within normal limits  URINALYSIS, ROUTINE W REFLEX MICROSCOPIC - Abnormal; Notable for the following components:   APPearance HAZY (*)    Leukocytes, UA SMALL (*)    Bacteria, UA MANY (*)    Squamous Epithelial / LPF 0-5 (*)    All other components within normal limits  D-DIMER, QUANTITATIVE (NOT AT [facility]) - Abnormal; Notable for the following  components:   D-Dimer, Quant 2.53 (*)    All other components within normal limits  I-STAT CG4 LACTIC ACID, ED - Abnormal; Notable for the following components:   Lactic Acid, Venous 0.44 (*)    All other components within normal limits  I-STAT TROPONIN, ED    EKG  EKG Interpretation  Date/Time:  Wednesday July 23 2017 07:32:06 EST Ventricular Rate:  61 PR Interval:  192 QRS Duration: 82 QT Interval:  378 QTC Calculation: 380 R Axis:   91 Text Interpretation:  Normal sinus rhythm Biatrial enlargement Rightward axis Pulmonary disease pattern ST & T wave abnormality, consider inferior ischemia ST & T wave abnormality, consider anterolateral ischemia Abnormal ECG since last tracing no significant change Confirmed by Rolan Bucco [phone]) on 07/23/2017 8:26:28 AM       Radiology Ct Angio Chest Pe W/cm &/or Wo Cm  Result Date: 07/23/2017 CLINICAL DATA:  Progressive shortness of breath over the last 3-4 days. Unable to ambulate due to breathing. The patient recently started antibiotics. EXAM: CT ANGIOGRAPHY CHEST WITH CONTRAST TECHNIQUE: Multidetector CT imaging of the chest was performed using the standard protocol during bolus administration of intravenous contrast. Multiplanar CT image reconstructions and MIPs were obtained to evaluate the vascular anatomy. CONTRAST:  <See Chart> ISOVUE-370 IOPAMIDOL (ISOVUE-370) INJECTION 76% COMPARISON:  One-view chest x-ray 07/23/2016. FINDINGS: Cardiovascular: Heart size is normal. Coronary artery calcifications are present. Atherosclerotic calcifications are present at the aortic arch and origins the great vessels without definite stenosis. There is no aneurysm. Pulmonary artery opacification is excellent. There is no focal filling defect to suggest pulmonary embolus. Mediastinum/Nodes: There is diffuse thickening and enlargement of the esophagus the level of C6 inferiorly to below the carina. The most masslike areas in the upper thoracic region. There  is asymmetric soft tissue at the level of T3 with a transverse diameter of 3.2 cm. No significant mediastinal, hilar, or axillary adenopathy is present. A soft tissue mass lesion in the right breast measures 1.7 x 2.2 x 2.3 cm. Lungs/Pleura: Centrilobular emphysema is present. There is no  focal airspace disease. Mild dependent atelectasis is present. No nodule or mass lesion is present. Upper Abdomen: Limited imaging of the upper abdomen demonstrates previous vascular embolization at the left lung base or diaphragm. No focal lesions are present otherwise. Musculoskeletal: A T11 superior endplate compression fracture appears remote. Vertebral body heights are otherwise maintained. No focal lytic or blastic lesions are present. The patient is status post median sternotomy. Review of the MIP images confirms the above findings. IMPRESSION: 1. No pulmonary embolus. 2. Diffuse thickening of the upper esophagus is concerning for neoplasm or marked inflammation. Recommend endoscopy or possibly PET scan. 3. Right lateral breast mass measures 1.7 x 2.2 x 2.3 cm. This has been stable across multiple prior mammograms. 4.  Aortic Atherosclerosis (ICD10-I70.0). 5.  Emphysema (ICD10-J43.9). These results were called by telephone at the time of interpretation on 07/23/2017 at 11:46 am to Dr. Shawna Orleans Iriana Artley , who verbally acknowledged these results. Electronically Signed   By: Marin Roberts M.D.   On: 07/23/2017 11:47   Dg Chest Portable 1 View  Result Date: 07/23/2017 CLINICAL DATA:  Shortness of breath for 2 days. Cough. COPD. Coronary artery disease. EXAM: PORTABLE CHEST 1 VIEW COMPARISON:  04/14/2007 FINDINGS: The heart size and mediastinal contours are within normal limits. Aortic atherosclerosis. Prior CABG. Pulmonary hyperinflation again noted. Both lungs are clear. No evidence of pneumothorax or pleural effusion. The visualized skeletal structures are unremarkable. IMPRESSION: COPD.  No active disease. Electronically  Signed   By: Myles Rosenthal M.D.   On: 07/23/2017 08:25    Procedures Procedures (including critical care time)  Medications Ordered in ED Medications  ipratropium-albuterol (DUONEB) 0.5-2.5 (3) MG/3ML nebulizer solution 3 mL (3 mLs Nebulization Given 07/23/17 0903)  predniSONE (DELTASONE) tablet 60 mg (60 mg Oral Given 07/23/17 0903)  iopamidol (ISOVUE-370) 76 % injection (80 mLs  Contrast Given 07/23/17 1101)     Initial Impression / Assessment and Plan / ED Course  I have reviewed the triage vital signs and the nursing notes.  Pertinent labs & imaging results that were available during my care of the patient were reviewed by me and considered in my medical decision making (see chart for details).     Patient presents with shortness of breath.  She states she has no known history of COPD although I do seen on her diagnosis list and chest x-ray findings are consistent with COPD.  She has diminished breath sounds bilaterally.  Her symptoms improved after nebulizer treatment.  She was also treated with steroids.  There is no evidence of pneumonia.  She had an elevated d-dimer and therefore a CT scan was performed which shows no evidence of pulmonary embolus however there is a thickened area of her esophagus which will be outpatient follow-up and possibly a biopsy.  Patient's labs are reviewed and are non-concerning.  She does have evidence of urinary tract infection and states that she is having some urinary frequency which she felt may be consistent with her UTIs.  She has an appointment on Monday with her primary care physician.  I encouraged her to follow-up with her PCP.  When she ambulated here in the ED, the nursing notes that she was tripoding after a couple of steps.  However following this I talked to her and she states she was feeling much better.  She was able to ambulate to the bathroom and get dressed without shortness of breath.  Her oxygen saturations are staying over 92.  I offered her  another breathing treatment  but she states she is ready to go and does not feel like she needs any further treatment.  She was dispensed an albuterol MDI to use at home and instructed how to use this.  She was started on a prednisone burst.  She was given a prescription for Keflex for her UTI.  She was encouraged to have close follow-up with her PCP.  Return precautions were given.  Final Clinical Impressions(s) / ED Diagnoses   Final diagnoses:  COPD exacerbation (HCC)  Esophageal thickening  Acute cystitis without hematuria    ED Discharge Orders        Ordered    cephALEXin (KEFLEX) 500 MG capsule  2 times daily     07/23/17 1306    predniSONE (DELTASONE) 20 MG tablet     07/23/17 1306       Rolan Bucco, MD 07/23/17 1309

## 2017-07-23 NOTE — Discharge Instructions (Signed)
You have a thickened area of your esophagus that will need outpatient follow-up and possibly a biopsy.

## 2017-07-23 NOTE — ED Notes (Signed)
Patient c/o sob states she had a episode of gasping for "air" and this happened again this am upon arrival to treatment room able to speak in complete sentences. No acute distress.

## 2017-07-23 NOTE — ED Notes (Signed)
Patient ambulated, however after taking several steps patient tripod.

## 2017-07-23 NOTE — ED Notes (Signed)
Pt ambulated to restroom to change into her clothes and pt did well. O2 sats 98% on arrival back to bed, pt denies any sob.

## 2017-07-23 NOTE — Telephone Encounter (Signed)
Pt called reporting she's had a sore throat, cough, chest congestion and headache for the past several days. Was seen at PCP office and placed on antibiotics, but feeling more short of breath this morning with productive. No chest pain. Doesn't report any fever. Was Flu neg. Given her symptoms was advised to follow up with PCP today, or go to UC/ED for further evaluation. Pt was agreeable to plan and thanked me for follow up call.

## 2017-07-23 NOTE — ED Triage Notes (Addendum)
Patient complains of increased shortness of breath x 3-4 days. Seen at her primary MD last week and started on antibiotics. States she cannot ambulate due to her breathing. denies pain. Fingers cool and color pale. Pulse ox reading questionable due to extremity coolness. Speaking single words during assessment

## 2017-08-09 ENCOUNTER — Other Ambulatory Visit: Payer: Self-pay | Admitting: Cardiology

## 2017-08-20 NOTE — Progress Notes (Deleted)
Cardiology Office Note   Date:  08/20/2017   ID:  [name], DOB 1951-06-05, MRN [id number]  PCP:  Dorena Bodo, PA-C  Cardiologist:   Rollene Rotunda, MD   No chief complaint on file.     History of Present Illness: [name] is a 67 y.o. female who presents for evaluation of known coronary disease. She has a history of CABG in 1996. 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   I sent her for a POET (Plain Old Exercise Treadmill) 2016,  This was negative for any evidence of ischemia. However, she had a hypertensive blood pressure response.  She has PVD with a lower extremity Doppler and she was found to have  50-74% right mid SFA stenosis and 50-74% left mid SFA disease along with >50% bilateral common and right external iliac artery stenosis.   No further therapy was planned.  She presented 05/12/17 with an anterior STEMI after she developed chest pain at work and called EMS. Cath revealed high grade LAD disease after the LIMA insertion and proximal RI disease. She underwent PCI with DES to both sites. Troponin peaked at 2.76. Her EF by echo was 45%. She was discharge 05/14/17 .  She was in the ED last month for SOB but was not thought to have a cardiac etiology.  She was thought to have COPD flare.  I reviewed these records for this visit.  ***  ***  She apparently had some high blood pressures treated by her primary care doctor. Was started on Norvasc. She said she had weeks of chest discomfort at least a month ago there was sharp. It was not like previous angina and subsequently went away. She's not very active at work and is not getting any chest pain. She does have some shortness of breath if she moves up and down a ladder very quickly. She still smoking cigarettes.  Past Medical History:  Diagnosis Date  . Allergy   . COPD (chronic obstructive pulmonary disease) (HCC)   . Coronary artery disease 05/16/1995   Cathed in November,  05/20/1995--CABG x 1-----LIMA to LAD, 11/18 STEMI PCI/DES pLAD, EF 45-50%  . Hyperlipidemia   . Hypertension   . Myocardial infarction (HCC)   . Nephrolithiasis    Left Nephrectomy   . Smoker   . Stroke [facility])     Past Surgical History:  Procedure Laterality Date  . CAROTID ARTERY - SUBCLAVIAN ARTERY BYPASS GRAFT Left 04/18/2007  . CORONARY ARTERY BYPASS GRAFT    . CORONARY/GRAFT ACUTE MI REVASCULARIZATION N/A 05/12/2017   Procedure: Coronary/Graft Acute MI Revascularization;  Surgeon: Tonny Bollman, MD;  Location: [facility] INVASIVE CV LAB;  Service: Cardiovascular;  Laterality: N/A;  . KIDNEY SURGERY Left    67 yo  . LEFT HEART CATH AND CORONARY ANGIOGRAPHY N/A 05/12/2017   Procedure: LEFT HEART CATH AND CORONARY ANGIOGRAPHY;  Surgeon: Tonny Bollman, MD;  Location: [facility] INVASIVE CV LAB;  Service: Cardiovascular;  Laterality: N/A;  . Left Nephrectomy Left 10/17/1983   Dr. Sural---StagHornCalculus, Left Kidney Nonfunctioning wiht Acute and Chronic Pyelonephritis     Current Outpatient Medications  Medication Sig Dispense Refill  . alendronate (FOSAMAX) 70 MG tablet TAKE 1 TABLET BY MOUTH ONCE WEEKLY TAKE WITH A FULL GLASS OF WATER ON AN EMPTY STOMACH 12 tablet 3  . ALPRAZolam (XANAX) 0.25 MG tablet TAKE 1 TABLET EVERY DAY 30 tablet 2  . amLODipine (NORVASC) 10 MG tablet Take 1 tablet (10  mg total) by mouth daily. 90 tablet 3  . aspirin 81 MG tablet Take 81 mg by mouth daily.    Marland Kitchen atorvastatin (LIPITOR) 40 MG tablet Take 2 tablets (80 mg total) by mouth daily. 60 tablet 1  . azithromycin (ZITHROMAX) 250 MG tablet Day 1: Take 2 daily.  Days 2 - 5: Take 1 daily. 6 tablet 0  . carvedilol (COREG) 3.125 MG tablet TAKE 1 TABLET (3.125 MG TOTAL) BY MOUTH 2 (TWO) TIMES DAILY WITH A MEAL. 60 tablet 2  . cephALEXin (KEFLEX) 500 MG capsule Take 1 capsule (500 mg total) by mouth 2 (two) times daily. 14 capsule 0  . Cholecalciferol (VITAMIN D3) 2000 units capsule Take 1 capsule (2,000 Units total) by  mouth daily.    . nitroGLYCERIN (NITROSTAT) 0.4 MG SL tablet PLACE 1 TABLET ON THE TONGUE &LET DISSOLVE EVERY 5 MINS AS NEEDED FOR CHEST PAIN 25 tablet 0  . predniSONE (DELTASONE) 20 MG tablet 2 tabs po daily x 4 days 8 tablet 0  . ticagrelor (BRILINTA) 90 MG TABS tablet Take 1 tablet (90 mg total) by mouth 2 (two) times daily. 180 tablet 1  . triamcinolone cream (KENALOG) 0.1 % APPLY 1 APPLICATION TOPICALLY 2 (TWO) TIMES DAILY. 30 g 0   No current facility-administered medications for this visit.     Allergies:   Patient has no known allergies.    ROS:  Please see the history of present illness.   Otherwise, review of systems are positive for none.   All other systems are reviewed and negative.    PHYSICAL EXAM: VS:  There were no vitals taken for this visit. , BMI There is no height or weight on file to calculate BMI. GENERAL:  Well appearing, very thin NECK:  No jugular venous distention, waveform within normal limits, carotid upstroke brisk and symmetric, no bruits, no thyromegaly LUNGS:  Clear to auscultation bilaterally BACK:  No CVA tenderness CHEST:  Unremarkable HEART:  PMI not displaced or sustained,S1 and S2 within normal limits, no S3, no S4, no clicks, no rubs, no murmurs ABD:  Flat, positive bowel sounds normal in frequency in pitch, positive bruits, no rebound, no guarding, no midline pulsatile mass, no hepatomegaly, no splenomegaly EXT:  2 plus pulses throughout, no edema, no cyanosis no clubbing    EKG:  EKG is not  ordered today.    Recent Labs: 07/23/2017: ALT 18; BUN 24; Creatinine, Ser 1.08; Hemoglobin 11.7; Platelets 339; Potassium 4.1; Sodium 138    Lipid Panel    Component Value Date/Time   CHOL 149 05/13/2017 0009   TRIG 128 05/13/2017 0009   HDL 56 05/13/2017 0009   CHOLHDL 2.7 05/13/2017 0009   VLDL 26 05/13/2017 0009   LDLCALC 67 05/13/2017 0009      Wt Readings from Last 3 Encounters:  07/21/17 97 lb 6.4 oz (44.2 kg)  05/20/17 97 lb 12.8 oz  (44.4 kg)  05/12/17 100 lb (45.4 kg)      Other studies Reviewed: Additional studies/ records that were reviewed today include:  ***     ASSESSMENT AND PLAN:   CAD:   ***   The patient had negative test for ischemia in 2016  Chest pain she was having a month or so has resolved and was very atypical.   She will continue with risk reduction.   No further testing is indicated.   ISCHEMIC CARDIOMYOPATHY:  ***  TOBACCO:    ***  We talked about this again today.  She is cutting back.    DYSLIPIDEMIA:    ***  Her cholesterol is not at target.  I will change to Lipitor 40 mg daily and repeat a lipid profile in 8 weeks.   Stop Zocor.  PVD:  She has disease as above.  *** She needs continued risk reduction.  No change in therapy is planned.  She needs to stop smoking  HTN:   ***  OThere was a slight differential in her blood pressures today between her 2 arms but they were both well controlled. No change in therapy is indicated.  AORTIC ATHEROSCLEROSIS:  This was noted on CT in the ED.  She will be managed with risk reduction.  ***  Current medicines are reviewed at length with the patient today.  The patient does not have concerns regarding medicines.  The following changes have been made:  ***  Labs/ tests ordered today include:  *** No orders of the defined types were placed in this encounter.    Disposition:   FU with in me in ***.      Signed, Rollene RotundaJames Mahi Zabriskie, MD  08/20/2017 8:53 PM     [facility]

## 2017-08-21 ENCOUNTER — Ambulatory Visit: Payer: BLUE CROSS/BLUE SHIELD | Admitting: Cardiology

## 2017-10-14 ENCOUNTER — Other Ambulatory Visit: Payer: Self-pay | Admitting: Cardiology

## 2017-10-14 NOTE — Telephone Encounter (Signed)
lm2cb Does pt take these medications? Says PCP d/c

## 2017-10-16 NOTE — Telephone Encounter (Signed)
REFILL 

## 2017-11-01 ENCOUNTER — Other Ambulatory Visit: Payer: Self-pay | Admitting: Cardiology

## 2017-11-03 NOTE — Telephone Encounter (Signed)
Rx has been sent to the pharmacy electronically. ° °

## 2017-11-05 ENCOUNTER — Other Ambulatory Visit: Payer: Self-pay | Admitting: Cardiology

## 2017-11-05 ENCOUNTER — Telehealth: Payer: Self-pay | Admitting: Cardiology

## 2017-11-05 NOTE — Telephone Encounter (Signed)
Rx(s) sent to pharmacy electronically.  

## 2017-11-05 NOTE — Telephone Encounter (Signed)
Called patient and LVM to call and schedule followup with Dr. Antoine Poche.

## 2017-11-14 ENCOUNTER — Telehealth: Payer: Self-pay | Admitting: Cardiology

## 2017-11-14 NOTE — Telephone Encounter (Signed)
Called patient and LVM to call back to schedule yearly followup with Dr. Antoine Poche.

## 2017-11-18 NOTE — Progress Notes (Signed)
Cardiology Office Note   Date:  11/20/2017   ID:  Carlis AbbottCarolyn Hegstrom, DOB 04/12/1951, MRN [id number]  PCP:  Dorena Bodoixon, Mary B, PA-C  Cardiologist:   Rollene RotundaJames Meital Riehl, MD   Chief Complaint  Patient presents with  . Coronary Artery Disease      History of Present Illness: Carlis AbbottCarolyn [name] is a 67 y.o. female who presents for evaluation of known coronary disease. She has a history of CABG in 1996. 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   I sent her for a POET (Plain Old Exercise Treadmill) 2016,  This was negative for any evidence of ischemia. However, she had a hypertensive blood pressure response.   She presented 05/12/17 with an anterior STEMI after she developed chest pain at work and called EMS. Cath revealed high grade LAD disease after the LIMA insertion and proximal RI disease. She underwent PCI with DES to both sites. Troponin peaked at 2.76.    Since I last saw her she has been doing well.  She did have an emergency room visit for bronchitis in February.  However, she has not been getting any of the chest discomfort that she was having.  She does yard work.  She works at an [employer] and does a lot of walking and goes up and down ladders. The patient denies any new symptoms such as chest discomfort, neck or arm discomfort. There has been no new shortness of breath, PND or orthopnea. There have been no reported palpitations, presyncope or syncope.  She is still smoking cigarettes and she use something called a "smokers tip" which removes the chemicals from the cigarette.    Past Medical History:  Diagnosis Date  . Allergy   . COPD (chronic obstructive pulmonary disease) (HCC)   . Coronary artery disease 05/16/1995   Cathed in November, 05/20/1995--CABG x 1-----LIMA to LAD, 11/18 STEMI PCI/DES pLAD, EF 45-50%  . Hyperlipidemia   . Hypertension   . Myocardial infarction (HCC)   . Nephrolithiasis    Left Nephrectomy   . Smoker   . Stroke [facility](HCC)      Past Surgical History:  Procedure Laterality Date  . CAROTID ARTERY - SUBCLAVIAN ARTERY BYPASS GRAFT Left 04/18/2007  . CORONARY ARTERY BYPASS GRAFT    . CORONARY/GRAFT ACUTE MI REVASCULARIZATION N/A 05/12/2017   Procedure: Coronary/Graft Acute MI Revascularization;  Surgeon: Tonny Bollmanooper, Michael, MD;  [facility];  Service: Cardiovascular;  Laterality: N/A;  . KIDNEY SURGERY Left    67 yo  . LEFT HEART CATH AND CORONARY ANGIOGRAPHY N/A 05/12/2017   Procedure: LEFT HEART CATH AND CORONARY ANGIOGRAPHY;  Surgeon: Tonny Bollmanooper, Michael, MD;  [facility];  Service: Cardiovascular;  Laterality: N/A;  . Left Nephrectomy Left 10/17/1983   Dr. Sural---StagHornCalculus, Left Kidney Nonfunctioning wiht Acute and Chronic Pyelonephritis     Current Outpatient Medications  Medication Sig Dispense Refill  . alendronate (FOSAMAX) 70 MG tablet TAKE 1 TABLET BY MOUTH ONCE WEEKLY TAKE WITH A FULL GLASS OF WATER ON AN EMPTY STOMACH 12 tablet 3  . ALPRAZolam (XANAX) 0.25 MG tablet TAKE 1 TABLET EVERY DAY 30 tablet 2  . aspirin 81 MG tablet Take 81 mg by mouth daily.    Marland Kitchen. atorvastatin (LIPITOR) 40 MG tablet Take 1 tablet (40 mg total) by mouth daily. NEED OV. 90 tablet 0  . azithromycin (ZITHROMAX) 250 MG tablet Day 1: Take 2 daily.  Days 2 - 5: Take  1 daily. 6 tablet 0  . carvedilol (COREG) 3.125 MG tablet TAKE 1 TABLET (3.125 MG TOTAL) BY MOUTH 2 (TWO) TIMES DAILY WITH A MEAL. 60 tablet 2  . cephALEXin (KEFLEX) 500 MG capsule Take 1 capsule (500 mg total) by mouth 2 (two) times daily. 14 capsule 0  . Cholecalciferol (VITAMIN D3) 2000 units capsule Take 1 capsule (2,000 Units total) by mouth daily.    Marland Kitchen lisinopril (PRINIVIL,ZESTRIL) 20 MG tablet Take 1 tablet (20 mg total) by mouth daily. NEED OV. 90 tablet 0  . nitroGLYCERIN (NITROSTAT) 0.4 MG SL tablet PLACE 1 TABLET ON THE TONGUE &LET DISSOLVE EVERY 5 MINS AS NEEDED FOR CHEST PAIN 25 tablet 0  . predniSONE (DELTASONE) 20 MG tablet  2 tabs po daily x 4 days 8 tablet 0  . ticagrelor (BRILINTA) 90 MG TABS tablet Take 1 tablet (90 mg total) by mouth 2 (two) times daily. PLEASE CONTACT OFFICE FOR ADDITIONAL REFILLS 60 tablet 1  . triamcinolone cream (KENALOG) 0.1 % APPLY 1 APPLICATION TOPICALLY 2 (TWO) TIMES DAILY. 30 g 0  . amLODipine (NORVASC) 10 MG tablet Take 1 tablet (10 mg total) by mouth daily. 90 tablet 3   No current facility-administered medications for this visit.     Allergies:   Patient has no known allergies.    ROS:  Please see the history of present illness.   Otherwise, review of systems are positive for none.   All other systems are reviewed and negative.    PHYSICAL EXAM: VS:  BP 110/60   Pulse 65   Ht 5\' 6"  (1.676 m)   Wt 99 lb 6.4 oz (45.1 kg)   BMI 16.04 kg/m  , BMI Body mass index is 16.04 kg/m.  GENERAL:  Well appearing, very thin NECK:  No jugular venous distention, waveform within normal limits, carotid upstroke brisk and symmetric, no bruits, no thyromegaly LUNGS:  Clear to auscultation bilaterally CHEST:  Unremarkable HEART:  PMI not displaced or sustained,S1 and S2 within normal limits, no S3, no S4, no clicks, no rubs, no murmurs ABD:  Flat, positive bowel sounds normal in frequency in pitch, no bruits, no rebound, no guarding, no midline pulsatile mass, no hepatomegaly, no splenomegaly EXT:  2 plus pulses throughout, no edema, no cyanosis no clubbing    EKG:  EKG is not ordered today.    Recent Labs: 07/23/2017: ALT 18; BUN 24; Creatinine, Ser 1.08; Hemoglobin 11.7; Platelets 339; Potassium 4.1; Sodium 138    Lipid Panel    Component Value Date/Time   CHOL 149 05/13/2017 0009   TRIG 128 05/13/2017 0009   HDL 56 05/13/2017 0009   CHOLHDL 2.7 05/13/2017 0009   VLDL 26 05/13/2017 0009   LDLCALC 67 05/13/2017 0009      Wt Readings from Last 3 Encounters:  11/20/17 99 lb 6.4 oz (45.1 kg)  07/21/17 97 lb 6.4 oz (44.2 kg)  05/20/17 97 lb 12.8 oz (44.4 kg)      Other  studies Reviewed: Additional studies/ records that were reviewed today include:  Labs     ASSESSMENT AND PLAN:  CAD:   The patient has no new sypmtoms.  No further cardiovascular testing is indicated.  We will continue with aggressive risk reduction and meds as listed.  We will see her back in November and likely discontinue the Brilinta at that time.   TOBACCO:     I gave her the number for [id number].  I doubt that the "smokers tip" does anything and  I asked her to talk to them about this.  She does not want to complete abstinence.  DYSLIPIDEMIA:    Her cholesterol is at target.  She will continue meds as listed.   CARDIOMYOPATHY:  I would suggest a repeat echo in Nov to follow up the mildly reduced EF.     HTN:   The blood pressure is at target. No change in medications is indicated. We will continue with therapeutic lifestyle changes (TLC).  SOLITARY KIDNEY:    No change in therapy.  Creat was 1.08 in Feb.   Current medicines are reviewed at length with the patient today.  The patient does not have concerns regarding medicines.  The following changes have been made:  None  Labs/ tests ordered today include:  None No orders of the defined types were placed in this encounter.    Disposition:   FU with in Nov .    Signed, Rollene Rotunda, MD  11/20/2017 8:49 AM    [facility]

## 2017-11-20 ENCOUNTER — Ambulatory Visit: Payer: BLUE CROSS/BLUE SHIELD | Admitting: Cardiology

## 2017-11-20 ENCOUNTER — Encounter: Payer: Self-pay | Admitting: Cardiology

## 2017-11-20 VITALS — BP 110/60 | HR 65 | Ht 66.0 in | Wt 99.4 lb

## 2017-11-20 DIAGNOSIS — I255 Ischemic cardiomyopathy: Secondary | ICD-10-CM | POA: Diagnosis not present

## 2017-11-20 DIAGNOSIS — Z720 Tobacco use: Secondary | ICD-10-CM

## 2017-11-20 DIAGNOSIS — I2510 Atherosclerotic heart disease of native coronary artery without angina pectoris: Secondary | ICD-10-CM | POA: Insufficient documentation

## 2017-11-20 NOTE — Patient Instructions (Signed)
Medication Instructions:  Continue current medications  If you need a refill on your cardiac medications before your next appointment, please call your pharmacy.  Labwork: None ordered   Testing/Procedures: None Ordered  Special Instructions: 1-800-QUIT-NOW  Follow-Up: Your physician wants you to follow-up in: 6 Months with Corine ShelterLuke Kilroy. You should receive a reminder letter in the mail two months in advance. If you do not receive a letter, please call our office [phone].     Thank you for choosing [facility]!!

## 2018-01-01 ENCOUNTER — Other Ambulatory Visit: Payer: Self-pay | Admitting: Cardiology

## 2018-01-01 NOTE — Telephone Encounter (Signed)
Rx request sent to pharmacy.  

## 2018-01-12 ENCOUNTER — Other Ambulatory Visit: Payer: Self-pay | Admitting: Cardiology

## 2018-02-02 ENCOUNTER — Other Ambulatory Visit: Payer: Self-pay | Admitting: Cardiology

## 2018-02-02 NOTE — Telephone Encounter (Signed)
Rx sent to pharmacy   

## 2018-03-29 ENCOUNTER — Other Ambulatory Visit: Payer: Self-pay | Admitting: Physician Assistant

## 2018-03-29 DIAGNOSIS — M810 Age-related osteoporosis without current pathological fracture: Secondary | ICD-10-CM

## 2018-04-04 ENCOUNTER — Other Ambulatory Visit: Payer: Self-pay | Admitting: Cardiology

## 2018-05-02 ENCOUNTER — Other Ambulatory Visit: Payer: Self-pay | Admitting: Cardiology

## 2018-05-23 ENCOUNTER — Other Ambulatory Visit: Payer: Self-pay | Admitting: Cardiology

## 2018-05-25 NOTE — Telephone Encounter (Signed)
Rx(s) sent to pharmacy electronically.  

## 2018-06-02 ENCOUNTER — Ambulatory Visit: Payer: BLUE CROSS/BLUE SHIELD | Admitting: Family Medicine

## 2018-06-17 HISTORY — PX: EYE SURGERY: SHX253

## 2018-06-22 ENCOUNTER — Encounter: Payer: Self-pay | Admitting: Family Medicine

## 2018-06-22 ENCOUNTER — Ambulatory Visit (INDEPENDENT_AMBULATORY_CARE_PROVIDER_SITE_OTHER): Payer: BLUE CROSS/BLUE SHIELD | Admitting: Family Medicine

## 2018-06-22 ENCOUNTER — Other Ambulatory Visit: Payer: Self-pay

## 2018-06-22 VITALS — BP 124/72 | HR 94 | Temp 98.2°F | Resp 18 | Ht 66.0 in | Wt 103.0 lb

## 2018-06-22 DIAGNOSIS — E785 Hyperlipidemia, unspecified: Secondary | ICD-10-CM | POA: Diagnosis not present

## 2018-06-22 DIAGNOSIS — Z905 Acquired absence of kidney: Secondary | ICD-10-CM | POA: Diagnosis not present

## 2018-06-22 DIAGNOSIS — I2510 Atherosclerotic heart disease of native coronary artery without angina pectoris: Secondary | ICD-10-CM

## 2018-06-22 DIAGNOSIS — M810 Age-related osteoporosis without current pathological fracture: Secondary | ICD-10-CM

## 2018-06-22 DIAGNOSIS — F17200 Nicotine dependence, unspecified, uncomplicated: Secondary | ICD-10-CM

## 2018-06-22 DIAGNOSIS — K047 Periapical abscess without sinus: Secondary | ICD-10-CM

## 2018-06-22 DIAGNOSIS — L209 Atopic dermatitis, unspecified: Secondary | ICD-10-CM

## 2018-06-22 DIAGNOSIS — J439 Emphysema, unspecified: Secondary | ICD-10-CM

## 2018-06-22 MED ORDER — ALPRAZOLAM 0.25 MG PO TABS: 0.2500 mg | ORAL_TABLET | Freq: Every day | ORAL | 1 refills | Status: DC

## 2018-06-22 MED ORDER — ALENDRONATE SODIUM 70 MG PO TABS: ORAL_TABLET | ORAL | 3 refills | Status: DC

## 2018-06-22 MED ORDER — TRIAMCINOLONE ACETONIDE 0.1 % EX CREA: 1.0000 "application " | TOPICAL_CREAM | Freq: Two times a day (BID) | CUTANEOUS | 2 refills | Status: DC

## 2018-06-22 MED ORDER — AMOXICILLIN 500 MG PO CAPS: 500.0000 mg | ORAL_CAPSULE | Freq: Three times a day (TID) | ORAL | 0 refills | Status: DC

## 2018-06-22 NOTE — Assessment & Plan Note (Signed)
Check renal function she does not follow with urology or nephrology.

## 2018-06-22 NOTE — Assessment & Plan Note (Signed)
Reviewed her cardiology note.  Her blood pressure has been controlled he is on statin drug. Is overdue for lipid panel is not been done since November 2018 also needs a renal function liver function done.

## 2018-06-22 NOTE — Progress Notes (Signed)
Subjective:    Patient ID: Jamie AbbottCarolyn Eslinger, female    DOB: 10/14/1950, 68 y.o.   MRN: [id number]  Patient presents for Follow-up (is not fasting) and Facial Swelling (has 2 teeth removed from L side weeks ago, but continues to have intermittent swelling)    She had 2 teeth pulled on left side, after abscess, [employer] in October. In the past 3 weeks on and off she keep getting pain and swelling    CAD- Dr. Antoine PocheHochrein, has hyperlipidemia, cardiomypoathy , history of heart attack- she has 2 stents and history of bypass, she is on Brillnta    HTN- taking bP meds as prescribed by cardiology without difficulty  Solitary Kidney-  She had left nephrectomy after severe pyelnonephritis and kidney stone , age  68   GAD- in xanax, takes 1/2 tablet twice a day for a few years     Osteoporosis- Vitamin D / Fosamax - due for bone Density     COPD- no current inhalers, continues to smoke    Eczema on face and loewr legs, uses kenolog   Review Of Systems:  GEN- denies fatigue, fever, weight loss,weakness, recent illness HEENT- denies eye drainage, change in vision, nasal discharge, CVS- denies chest pain, palpitations RESP- denies SOB, cough, wheeze ABD- denies N/V, change in stools, abd pain GU- denies dysuria, hematuria, dribbling, incontinence MSK- denies joint pain, muscle aches, injury Neuro- denies headache, dizziness, syncope, seizure activity       Objective:    BP 124/72   Pulse 94   Temp 98.2 F (36.8 C) (Oral)   Resp 18   Ht 5\' 6"  (1.676 m)   Wt 103 lb (46.7 kg)   SpO2 96%   BMI 16.62 kg/m  GEN- NAD, alert and oriented x3 HEENT- PERRL, EOMI, non injected sclera, pink conjunctiva, MMM, oropharynx clear, abscess of lower jaw line Neck- Supple, shotty ant LAD CVS- RRR, no murmur RESP-CTAB ABD-NABS,soft,NT,ND Psych-  EXT- No edema Pulses- Radial, DP- 2+        Assessment & Plan:      Problem List Items Addressed This Visit      Unprioritized   COPD  (chronic obstructive pulmonary disease) (HCC)    No active problems however she does continue to smoke.      Coronary artery disease involving native coronary artery of native heart without angina pectoris - Primary    Reviewed her cardiology note.  Her blood pressure has been controlled he is on statin drug. Is overdue for lipid panel is not been done since November 2018 also needs a renal function liver function done.          Relevant Orders   CBC with Differential/Platelet   Comprehensive metabolic panel   Lipid panel   Dyslipidemia   Relevant Orders   Lipid panel   Osteoporosis    Continue Fosamax and vitamin D.  Discussed with her that she needs a bone density she wants to wait until her physical so we will address this at her next visit in 6 months.      Relevant Medications   alendronate (FOSAMAX) 70 MG tablet   Smoker   Solitary kidney, acquired    Check renal function she does not follow with urology or nephrology.       Other Visit Diagnoses    Tooth infection       We will start her on amoxicillin advised her to follow-up with her dentist may need repeat imaging of the gumline  Atopic dermatitis, unspecified type       Relevant Medications   triamcinolone cream (KENALOG) 0.1 %      Note: This dictation was prepared with Dragon dictation along with smaller phrase technology. Any transcriptional errors that result from this process are unintentional.

## 2018-06-22 NOTE — Assessment & Plan Note (Signed)
Continue Fosamax and vitamin D.  Discussed with her that she needs a bone density she wants to wait until her physical so we will address this at her next visit in 6 months.

## 2018-06-22 NOTE — Patient Instructions (Signed)
F/U 6 months for Physical  

## 2018-06-22 NOTE — Assessment & Plan Note (Signed)
No active problems however she does continue to smoke.

## 2018-06-23 LAB — COMPREHENSIVE METABOLIC PANEL
AG Ratio: 1.7 (calc) (ref 1.0–2.5)
ALBUMIN MSPROF: 3.9 g/dL (ref 3.6–5.1)
ALKALINE PHOSPHATASE (APISO): 54 U/L (ref 33–130)
ALT: 5 U/L — ABNORMAL LOW (ref 6–29)
AST: 19 U/L (ref 10–35)
BUN / CREAT RATIO: 21 (calc) (ref 6–22)
BUN: 25 mg/dL (ref 7–25)
CALCIUM: 9.2 mg/dL (ref 8.6–10.4)
CO2: 23 mmol/L (ref 20–32)
CREATININE: 1.21 mg/dL — AB (ref 0.50–0.99)
Chloride: 112 mmol/L — ABNORMAL HIGH (ref 98–110)
GLOBULIN: 2.3 g/dL (ref 1.9–3.7)
GLUCOSE: 69 mg/dL (ref 65–99)
POTASSIUM: 5.1 mmol/L (ref 3.5–5.3)
SODIUM: 144 mmol/L (ref 135–146)
Total Bilirubin: 0.3 mg/dL (ref 0.2–1.2)
Total Protein: 6.2 g/dL (ref 6.1–8.1)

## 2018-06-23 LAB — LIPID PANEL
CHOL/HDL RATIO: 3.5 (calc) (ref <5.0)
Cholesterol: 214 mg/dL — ABNORMAL HIGH (ref <200)
HDL: 62 mg/dL (ref >50)
LDL CHOLESTEROL (CALC): 136 mg/dL — AB
Non-HDL Cholesterol (Calc): 152 mg/dL (calc) — ABNORMAL HIGH (ref <130)
Triglycerides: 67 mg/dL (ref <150)

## 2018-06-23 LAB — CBC WITH DIFFERENTIAL/PLATELET
Absolute Monocytes: 639 cells/uL (ref 200–950)
BASOS PCT: 1.4 %
Basophils Absolute: 108 cells/uL (ref 0–200)
EOS PCT: 1.7 %
Eosinophils Absolute: 131 cells/uL (ref 15–500)
HCT: 35.9 % (ref 35.0–45.0)
Hemoglobin: 12.1 g/dL (ref 11.7–15.5)
Lymphs Abs: 1555 cells/uL (ref 850–3900)
MCH: 29.7 pg (ref 27.0–33.0)
MCHC: 33.7 g/dL (ref 32.0–36.0)
MCV: 88.2 fL (ref 80.0–100.0)
MONOS PCT: 8.3 %
MPV: 9.2 fL (ref 7.5–12.5)
NEUTROS PCT: 68.4 %
Neutro Abs: 5267 cells/uL (ref 1500–7800)
PLATELETS: 360 10*3/uL (ref 140–400)
RBC: 4.07 10*6/uL (ref 3.80–5.10)
RDW: 14.1 % (ref 11.0–15.0)
TOTAL LYMPHOCYTE: 20.2 %
WBC: 7.7 10*3/uL (ref 3.8–10.8)

## 2018-06-26 ENCOUNTER — Other Ambulatory Visit: Payer: Self-pay | Admitting: Family Medicine

## 2018-06-26 DIAGNOSIS — E785 Hyperlipidemia, unspecified: Secondary | ICD-10-CM

## 2018-06-26 DIAGNOSIS — Z905 Acquired absence of kidney: Secondary | ICD-10-CM

## 2018-06-26 MED ORDER — ATORVASTATIN CALCIUM 80 MG PO TABS: 80.0000 mg | ORAL_TABLET | Freq: Every day | ORAL | 3 refills | Status: DC

## 2018-06-29 ENCOUNTER — Telehealth: Payer: Self-pay | Admitting: Cardiology

## 2018-06-29 NOTE — Telephone Encounter (Signed)
Yes.  OK to to take Lipitor.  Call Ms. Reels with the results and send results to [facility], Velna HatchetKawanta F, MD

## 2018-06-29 NOTE — Telephone Encounter (Signed)
New Message:      Pt saw her primary doctor and her Cholesterol was real high. Her doctor increased one of her medicine and she is very concerned. She would like for Dr Antoine Poche view her notes and see what medicine was increased and it was okay for her to take it.

## 2018-07-01 NOTE — Telephone Encounter (Signed)
Call and leave message on pt voicemail(DPR) of Dr Antoine Poche recommendations.

## 2018-07-02 ENCOUNTER — Encounter: Payer: Self-pay | Admitting: *Deleted

## 2018-07-18 ENCOUNTER — Other Ambulatory Visit: Payer: Self-pay | Admitting: Cardiology

## 2018-07-20 NOTE — Telephone Encounter (Signed)
Rx(s) sent to pharmacy electronically.  

## 2018-10-10 ENCOUNTER — Telehealth: Payer: Self-pay | Admitting: Cardiology

## 2018-10-10 NOTE — Telephone Encounter (Signed)
LVMTCB to schedule from appt from recall list with Dr. Hochrein.  °

## 2018-10-16 ENCOUNTER — Telehealth: Payer: Self-pay

## 2018-10-16 NOTE — Telephone Encounter (Signed)
LEFT VM FOR PT TO CALL THE OFFICE TO MAKE APPT VIRTUAL

## 2018-10-18 NOTE — Progress Notes (Signed)
Virtual Visit via Telephone Note   This visit type was conducted due to national recommendations for restrictions regarding the COVID-19 Pandemic (e.g. social distancing) in an effort to limit this patient's exposure and mitigate transmission in our community.  Due to her co-morbid illnesses, this patient is at least at moderate risk for complications without adequate follow up.  This format is felt to be most appropriate for this patient at this time.  The patient did not have access to video technology/had technical difficulties with video requiring transitioning to audio format only (telephone).  All issues noted in this document were discussed and addressed.  No physical exam could be performed with this format.  Please refer to the patient's chart for her  consent to telehealth for [facility].   Date:  10/20/2018   ID:  [name], DOB 04/05/1951, MRN [id number]  Patient Location: Home Provider Location: Home  PCP:  Salley Scarleturham, Kawanta F, MD  Cardiologist:  Rollene RotundaJames Corry Storie, MD  Electrophysiologist:  None   Evaluation Performed:  Follow-Up Visit  Chief Complaint:  CAD  History of Present Illness:    Jamie AbbottCarolyn [name] is a 68 y.o. female who presents for evaluation of known coronary disease. She has a history of CABG in 1996. 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   I sent her for a POET (Plain Old Exercise Treadmill) 2016,  This was negative for any evidence of ischemia. However, she had a hypertensive blood pressure response.   She presented 05/12/17 with an anterior STEMIafter she developed chest pain at work and called EMS. Cath revealed high grade LAD disease after the LIMA insertion and proximal RI disease. She underwent PCI with DES to both sites. Troponin peaked at 2.76.    Since I last saw her she has done OK.  The patient denies any new symptoms such as chest discomfort, neck or arm discomfort. There has been no new shortness of breath,  PND or orthopnea. There have been no symptoms similar to her previous angina.  She is not required any nitroglycerin.  She apparently is working full-time.  She still smoking cigarettes but she is interested in quitting.  The patient does not have symptoms concerning for COVID-19 infection (fever, chills, cough, or new shortness of breath).    Past Medical History:  Diagnosis Date  . Allergy   . COPD (chronic obstructive pulmonary disease) (HCC)   . Coronary artery disease 05/16/1995   Cathed in November, 05/20/1995--CABG x 1-----LIMA to LAD, 11/18 STEMI PCI/DES pLAD, EF 45-50%  . Hyperlipidemia   . Hypertension   . Myocardial infarction (HCC)   . Nephrolithiasis    Left Nephrectomy   . Smoker   . Stroke [facility](HCC)    Past Surgical History:  Procedure Laterality Date  . CAROTID ARTERY - SUBCLAVIAN ARTERY BYPASS GRAFT Left 04/18/2007  . CORONARY ARTERY BYPASS GRAFT    . CORONARY/GRAFT ACUTE MI REVASCULARIZATION N/A 05/12/2017   Procedure: Coronary/Graft Acute MI Revascularization;  Surgeon: Tonny Bollmanooper, Michael, MD;  [facility];  Service: Cardiovascular;  Laterality: N/A;  . KIDNEY SURGERY Left    68 yo  . LEFT HEART CATH AND CORONARY ANGIOGRAPHY N/A 05/12/2017   Procedure: LEFT HEART CATH AND CORONARY ANGIOGRAPHY;  Surgeon: Tonny Bollmanooper, Michael, MD;  [facility];  Service: Cardiovascular;  Laterality: N/A;  . Left Nephrectomy Left 10/17/1983   Dr. Sural---StagHornCalculus, Left Kidney Nonfunctioning wiht Acute and Chronic Pyelonephritis  Current Meds  Medication Sig  . alendronate (FOSAMAX) 70 MG tablet TAKE 1 TABLET BY MOUTH ONCE WEEKLY TAKE WITH A FULL GLASS OF WATER ON AN EMPTY STOMACH  . ALPRAZolam (XANAX) 0.25 MG tablet Take 1 tablet (0.25 mg total) by mouth daily.  Marland Kitchen amLODipine (NORVASC) 10 MG tablet TAKE 1 TABLET BY MOUTH EVERY DAY  . aspirin 81 MG tablet Take 81 mg by mouth daily.  . Cholecalciferol (VITAMIN D3) 2000 units capsule Take 1 capsule  (2,000 Units total) by mouth daily.  Marland Kitchen lisinopril (PRINIVIL,ZESTRIL) 20 MG tablet Take 1 tablet (20 mg total) by mouth daily.  . nitroGLYCERIN (NITROSTAT) 0.4 MG SL tablet PLACE 1 TABLET ON THE TONGUE &LET DISSOLVE EVERY 5 MINS AS NEEDED FOR CHEST PAIN  . triamcinolone cream (KENALOG) 0.1 % Apply 1 application topically 2 (two) times daily.  . [DISCONTINUED] amoxicillin (AMOXIL) 500 MG capsule Take 1 capsule (500 mg total) by mouth 3 (three) times daily.  . [DISCONTINUED] atorvastatin (LIPITOR) 80 MG tablet Take 1 tablet (80 mg total) by mouth daily.     Allergies:   Patient has no known allergies.   Social History   Tobacco Use  . Smoking status: Current Every Day Smoker    Packs/day: 0.25    Years: 40.00    Pack years: 10.00    Types: Cigarettes  . Smokeless tobacco: Never Used  Substance Use Topics  . Alcohol use: No  . Drug use: No     Family Hx: The patient's family history includes COPD in her brother and father; Cancer in her sister and sister; Depression in her sister.  ROS:   Please see the history of present illness.    As stated in the HPI and negative for all other systems.   Prior CV studies:   The following studies were reviewed today:  Labs  Labs/Other Tests and Data Reviewed:    EKG:  No ECG reviewed.  Recent Labs: 06/22/2018: ALT 5; BUN 25; Creat 1.21; Hemoglobin 12.1; Platelets 360; Potassium 5.1; Sodium 144   Recent Lipid Panel Lab Results  Component Value Date/Time   CHOL 214 (H) 06/22/2018 03:12 PM   TRIG 67 06/22/2018 03:12 PM   HDL 62 06/22/2018 03:12 PM   CHOLHDL 3.5 06/22/2018 03:12 PM   LDLCALC 136 (H) 06/22/2018 03:12 PM    Wt Readings from Last 3 Encounters:  10/19/18 105 lb (47.6 kg)  06/22/18 103 lb (46.7 kg)  11/20/17 99 lb 6.4 oz (45.1 kg)     Objective:    Vital Signs:  Ht 5\' 6"  (1.676 m)   Wt 105 lb (47.6 kg)   BMI 16.95 kg/m    VITAL SIGNS:  reviewed  ASSESSMENT & PLAN:    CAD:  The patient has no new sypmtoms.   No further cardiovascular testing is indicated.  We will continue with aggressive risk reduction and meds as listed.  She can stop the Brilinta.  I think this has been discontinued.  We will be checking with her pharmacy to find out.  She will remain on low dose ASA  TOBACCO:      She wants to try Chantix and I will order this. He does want to try Chantix.  We discussed all of the potential side effects.    He has no depression.  He understands and wishes to try this medication.  DYSLIPIDEMIA:     Her LDL was not at target.  I will check to see if she is taking her  Lipitor.  If not I will restart this.  If so, I will change to Crestor 40 mg and repeat a lipid profile in    Her cholesterol is at target.  She will continue meds as listed.   CARDIOMYOPATHY:   She has had a mildly reduced EF.  I will follow up when I see her back in the office.  She has no symptoms.    HTN:     This has been well controlled.  No change in therapy.    SOLITARY KIDNEY:   No change in therapy.  Her creat was mildly elevated recently.  This can be followed by her Jeanice Lim, Velna Hatchet, MD  COVID-19 Education: The signs and symptoms of COVID-19 were discussed with the patient and how to seek care for testing (follow up with PCP or arrange E-visit).  The importance of social distancing was discussed today.  Time:   Today, I have spent 25 minutes with the patient with telehealth technology discussing the above problems.     Medication Adjustments/Labs and Tests Ordered: Current medicines are reviewed at length with the patient today.  Concerns regarding medicines are outlined above.   Tests Ordered: No orders of the defined types were placed in this encounter.   Medication Changes: Meds ordered this encounter  Medications  . varenicline (CHANTIX CONTINUING MONTH PAK) 1 MG tablet    Sig: Take 1 tablet (1 mg total) by mouth 2 (two) times daily.    Dispense:  60 tablet    Refill:  3  . carvedilol (COREG) 3.125 MG  tablet    Sig: Take 1 tablet (3.125 mg total) by mouth 2 (two) times daily with a meal.    Dispense:  180 tablet    Refill:  3  . rosuvastatin (CRESTOR) 40 MG tablet    Sig: Take 1 tablet (40 mg total) by mouth daily.    Dispense:  90 tablet    Refill:  3    Disposition:  Follow up see her in one year.   Signed, Rollene Rotunda, MD  10/20/2018 8:37 AM    [facility]

## 2018-10-19 ENCOUNTER — Other Ambulatory Visit: Payer: Self-pay

## 2018-10-19 ENCOUNTER — Ambulatory Visit (INDEPENDENT_AMBULATORY_CARE_PROVIDER_SITE_OTHER): Payer: BLUE CROSS/BLUE SHIELD | Admitting: Cardiology

## 2018-10-19 ENCOUNTER — Encounter: Payer: Self-pay | Admitting: Cardiology

## 2018-10-19 VITALS — Ht 66.0 in | Wt 105.0 lb

## 2018-10-19 DIAGNOSIS — E785 Hyperlipidemia, unspecified: Secondary | ICD-10-CM

## 2018-10-19 DIAGNOSIS — I10 Essential (primary) hypertension: Secondary | ICD-10-CM | POA: Diagnosis not present

## 2018-10-19 DIAGNOSIS — I255 Ischemic cardiomyopathy: Secondary | ICD-10-CM | POA: Diagnosis not present

## 2018-10-19 DIAGNOSIS — I2510 Atherosclerotic heart disease of native coronary artery without angina pectoris: Secondary | ICD-10-CM

## 2018-10-19 MED ORDER — CARVEDILOL 3.125 MG PO TABS: 3.1250 mg | ORAL_TABLET | Freq: Two times a day (BID) | ORAL | 3 refills | Status: DC

## 2018-10-19 MED ORDER — VARENICLINE TARTRATE 1 MG PO TABS: 1.0000 mg | ORAL_TABLET | Freq: Two times a day (BID) | ORAL | 3 refills | Status: DC

## 2018-10-19 MED ORDER — ROSUVASTATIN CALCIUM 40 MG PO TABS: 40.0000 mg | ORAL_TABLET | Freq: Every day | ORAL | 3 refills | Status: DC

## 2018-10-19 NOTE — Patient Instructions (Signed)
Medication Instructions:  START- Chantix  STOP- Atorvastatin(lipitor) START- Crestor 40 mg daily  If you need a refill on your cardiac medications before your next appointment, please call your pharmacy.  Labwork: Fasting Lipid and Liver in 10 weeks   Testing/Procedures: None Ordered  Follow-Up: You will need a follow up appointment in 1 Year.  Please call our office 2 months in advance to schedule this appointment.  You may see Rollene Rotunda, MD or one of the following Advanced Practice Providers on your designated Care Team:   Theodore Demark, PA-C . Joni Reining, DNP, ANP      At [facility], you and your health needs are our priority.  As part of our continuing mission to provide you with exceptional heart care, we have created designated Provider Care Teams.  These Care Teams include your primary Cardiologist (physician) and Advanced Practice Providers (APPs -  Physician Assistants and Nurse Practitioners) who all work together to provide you with the care you need, when you need it.  Thank you for choosing [facility] at [facility]!!

## 2018-10-20 ENCOUNTER — Encounter: Payer: Self-pay | Admitting: Cardiology

## 2018-10-27 ENCOUNTER — Other Ambulatory Visit: Payer: Self-pay | Admitting: Cardiology

## 2018-10-27 MED ORDER — NITROGLYCERIN 0.4 MG SL SUBL: SUBLINGUAL_TABLET | SUBLINGUAL | 1 refills | Status: DC

## 2018-10-27 NOTE — Telephone Encounter (Signed)
°*  STAT* If patient is at the pharmacy, call can be transferred to refill team. ° ° °1. Which medications need to be refilled? (please list name of each medication and dose if known) new prescription for Nitroglycerin ° °2. Which pharmacy/location (including street and city if local pharmacy) is medication to be sent to? [phone] ° °3. Do they need a 30 day or 90 day supply? 1 bottle ° ° °

## 2018-12-24 IMAGING — CT CT ANGIO CHEST
2 of 6 series · 18 of 36 positions shown · IV contrast (iopamidol)
Comparison: One-view chest x-ray 07/23/2016.

CLINICAL DATA: Progressive shortness of breath over the last 3-4
days. Unable to ambulate due to breathing. The patient recently
started antibiotics.

EXAM:
CT ANGIOGRAPHY CHEST WITH CONTRAST
TECHNIQUE: Multidetector CT imaging of the chest was performed using the
standard protocol during bolus administration of intravenous
contrast. Multiplanar CT image reconstructions and MIPs were
obtained to evaluate the vascular anatomy.
CONTRAST:  <See Chart> TP26DA-1QI IOPAMIDOL (TP26DA-1QI) INJECTION
76%

[Series 7: pe thins · axial · 0.60mm/px · z∈[+1092,+1364]mm · 17 of 305 slices shown]
[im 17/305  lung]
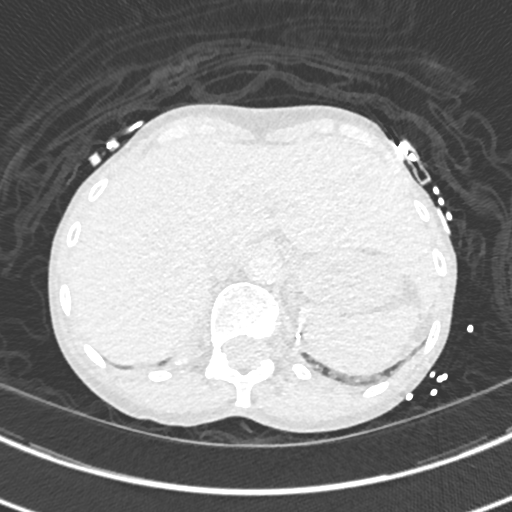
[im 34/305  mediastinal]
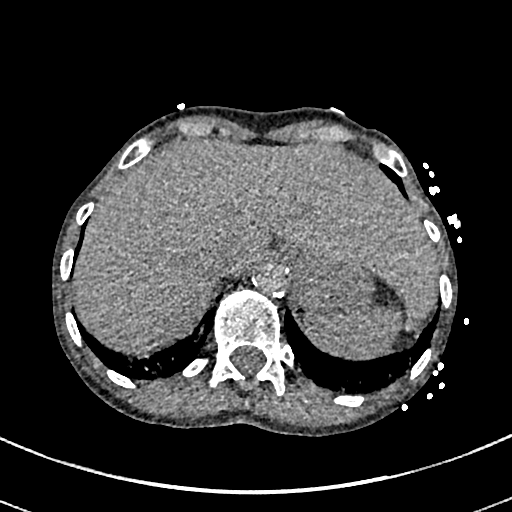
[im 51/305  lung]
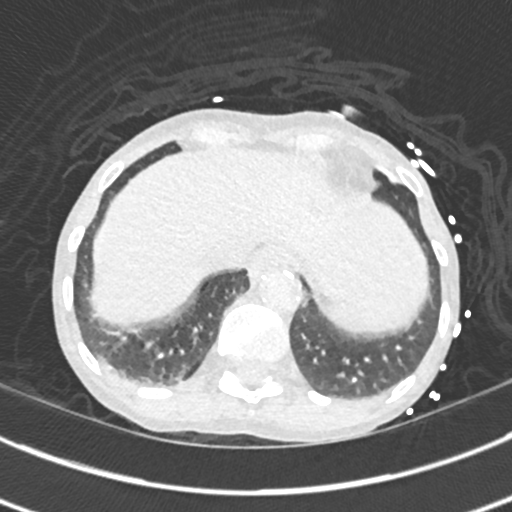
[im 68/305  mediastinal]
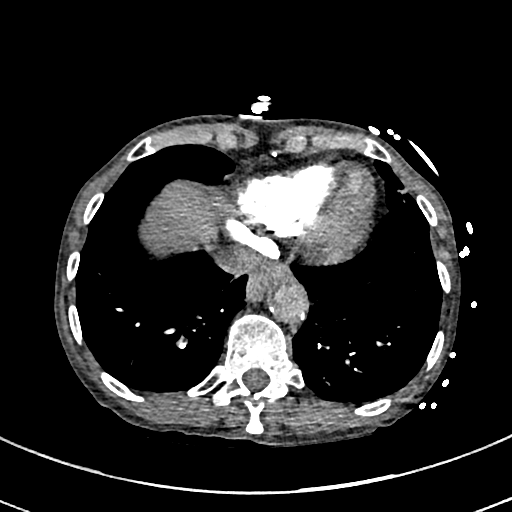
[im 85/305  lung]
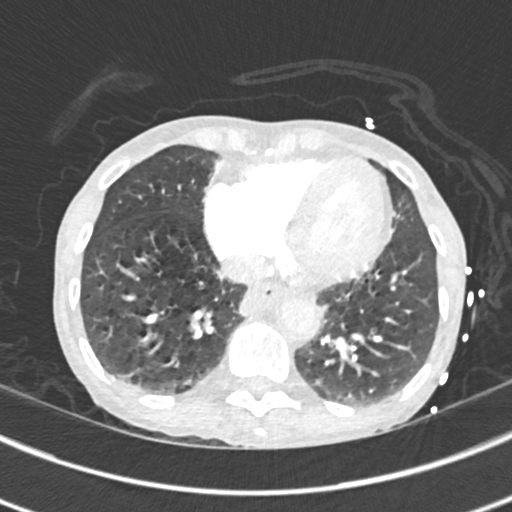
[im 102/305  mediastinal]
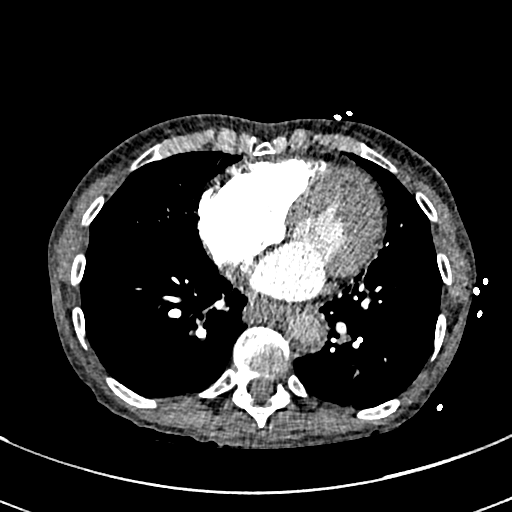
[im 119/305  lung]
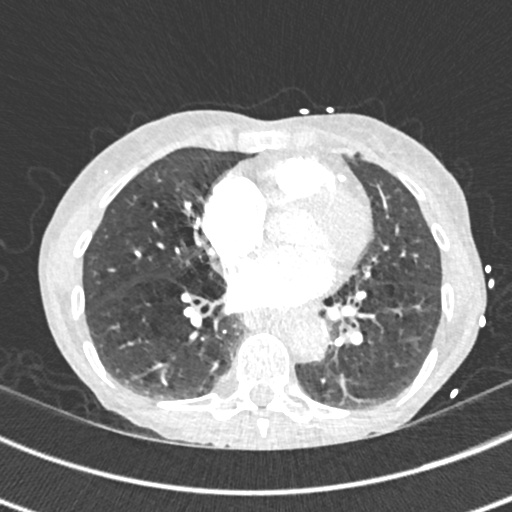
[im 136/305  mediastinal]
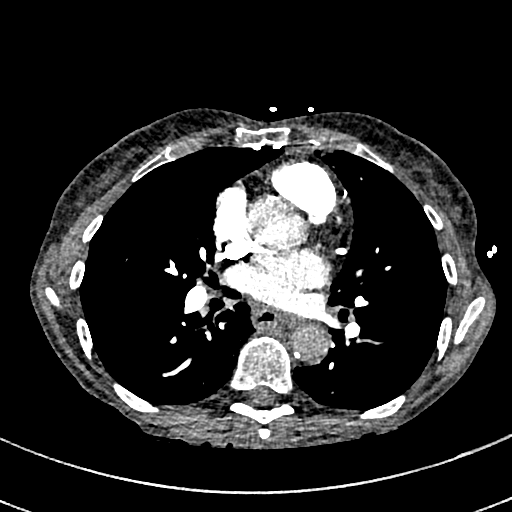
[im 153/305  lung]
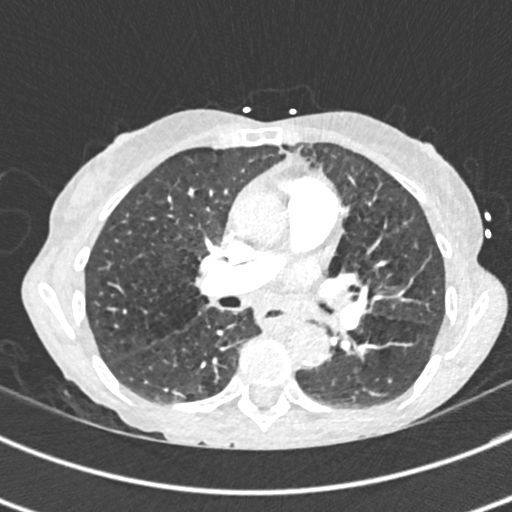
[im 169/305  mediastinal]
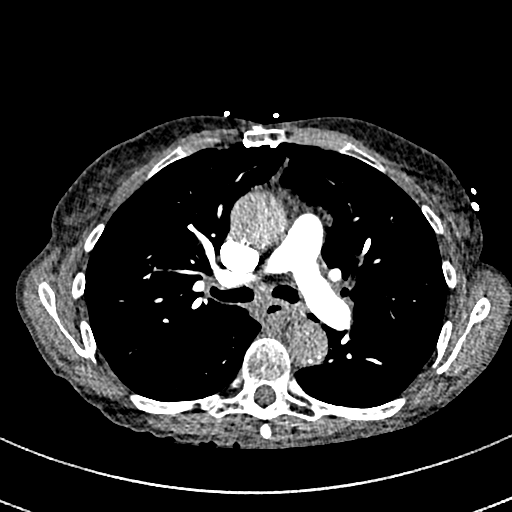
[im 186/305  lung]
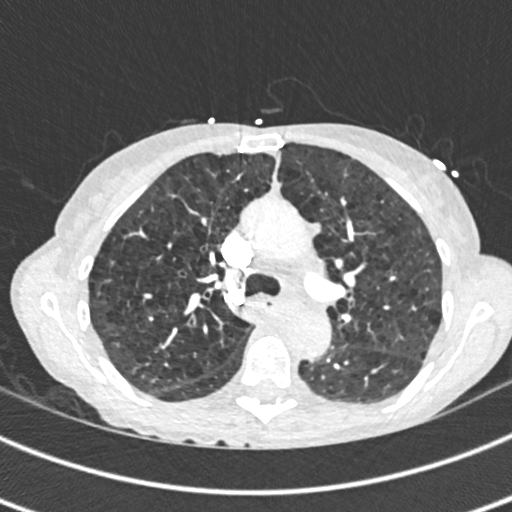
[im 203/305  mediastinal]
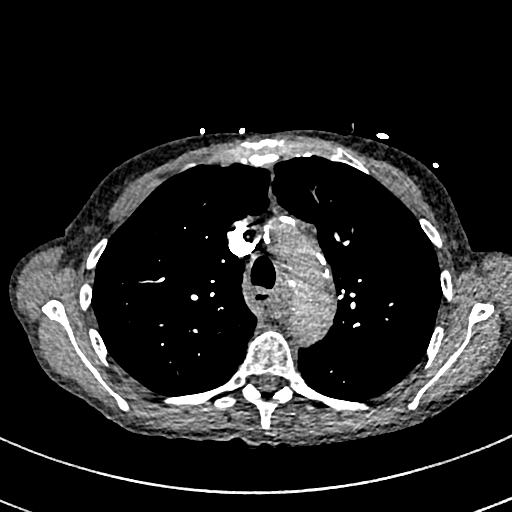
[im 220/305  lung]
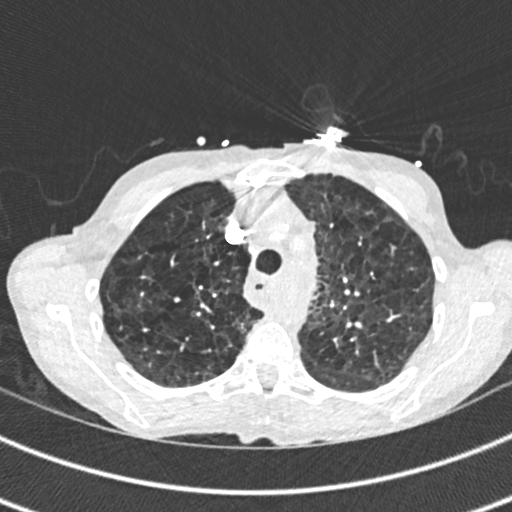
[im 237/305  mediastinal]
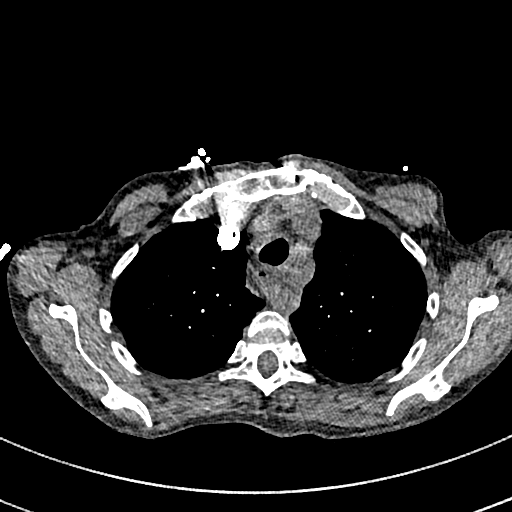
[im 254/305  lung]
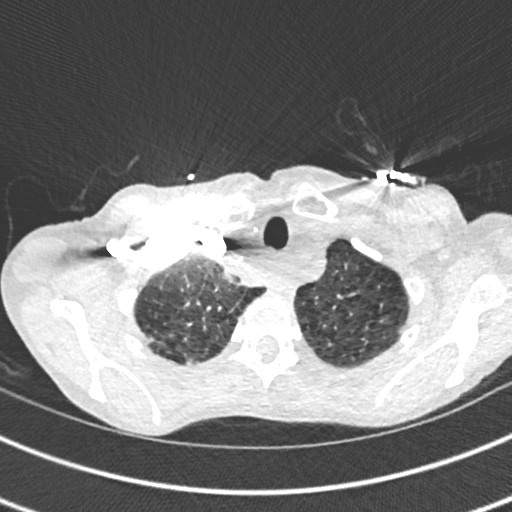
[im 271/305  mediastinal]
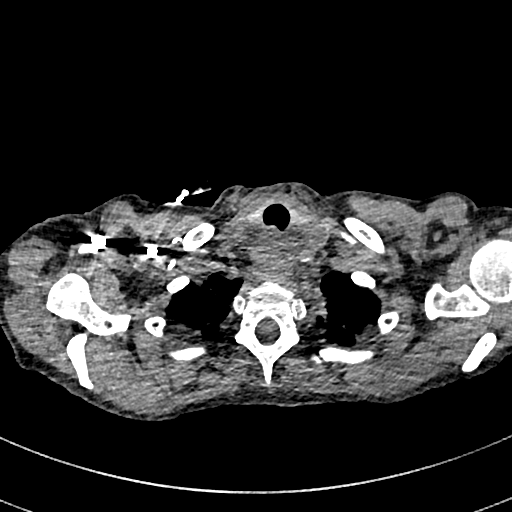
[im 288/305  lung]
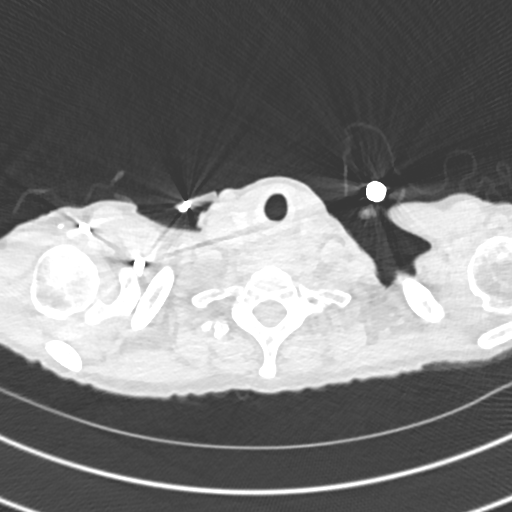

[Series 8: pe 2mm cor · coronal · 0.60mm/px · 1 of 128 slices shown]
[im 64/128  mediastinal]
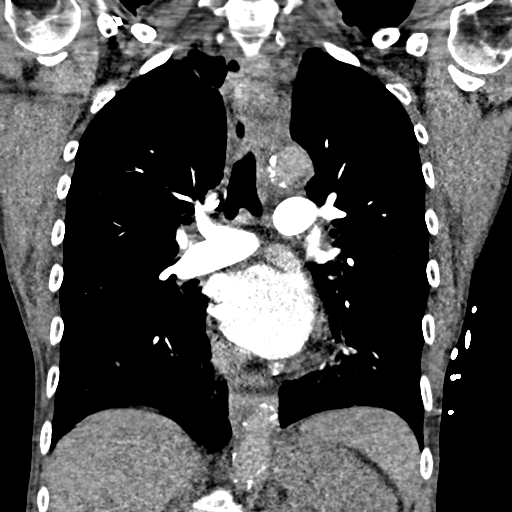

[18 of 36 positions shown; findings below may reference images not displayed]

FINDINGS: Cardiovascular: Heart size is normal. Coronary artery calcifications
are present. Atherosclerotic calcifications are present at the
aortic arch and origins the great vessels without definite stenosis.
There is no aneurysm.

Pulmonary artery opacification is excellent. There is no focal
filling defect to suggest pulmonary embolus.

Mediastinum/Nodes: There is diffuse thickening and enlargement of
the esophagus the level of C6 inferiorly to below the carina. The
most masslike areas in the upper thoracic region. There is
asymmetric soft tissue at the level of T3 with a transverse diameter
of 3.2 cm. No significant mediastinal, hilar, or axillary adenopathy
is present.

A soft tissue mass lesion in the right breast measures 1.7 x 2.2 x
2.3 cm.

Lungs/Pleura: Centrilobular emphysema is present. There is no focal
airspace disease. Mild dependent atelectasis is present. No nodule
or mass lesion is present.

Upper Abdomen: Limited imaging of the upper abdomen demonstrates
previous vascular embolization at the left lung base or diaphragm.
No focal lesions are present otherwise.

Musculoskeletal: A T11 superior endplate compression fracture
appears remote. Vertebral body heights are otherwise maintained. No
focal lytic or blastic lesions are present. The patient is status
post median sternotomy.

Review of the MIP images confirms the above findings.
IMPRESSION: 1. No pulmonary embolus.
2. Diffuse thickening of the upper esophagus is concerning for
neoplasm or marked inflammation. Recommend endoscopy or possibly PET
scan.
3. Right lateral breast mass measures 1.7 x 2.2 x 2.3 cm. This has
been stable across multiple prior mammograms.
4.  Aortic Atherosclerosis (VZKVN-AE7.7).
5.  Emphysema (VZKVN-OB5.8).

These results were called by telephone at the time of interpretation
on 07/23/2017 at [DATE] to Dr. REMI RITIENNE TARABELLA , who verbally
acknowledged these results.

## 2019-01-17 ENCOUNTER — Other Ambulatory Visit: Payer: Self-pay | Admitting: Cardiology

## 2019-01-22 ENCOUNTER — Other Ambulatory Visit: Payer: Self-pay | Admitting: Family Medicine

## 2019-01-22 NOTE — Telephone Encounter (Signed)
Pt is requesting refill on Xanax   LOV: 06/22/18  LRF: 06/22/18

## 2019-03-16 ENCOUNTER — Other Ambulatory Visit: Payer: Self-pay

## 2019-03-16 ENCOUNTER — Encounter (HOSPITAL_BASED_OUTPATIENT_CLINIC_OR_DEPARTMENT_OTHER): Payer: Self-pay

## 2019-03-18 ENCOUNTER — Ambulatory Visit: Payer: Self-pay | Admitting: Oral Surgery

## 2019-03-19 ENCOUNTER — Encounter (HOSPITAL_BASED_OUTPATIENT_CLINIC_OR_DEPARTMENT_OTHER)
Admission: RE | Admit: 2019-03-19 | Discharge: 2019-03-19 | Disposition: A | Discharge status: Home / Self Care | Payer: Medicare Other | Source: Ambulatory Visit | Attending: Oral Surgery | Admitting: Oral Surgery

## 2019-03-19 ENCOUNTER — Other Ambulatory Visit (HOSPITAL_COMMUNITY)
Admission: RE | Admit: 2019-03-19 | Discharge: 2019-03-19 | Disposition: A | Discharge status: Home / Self Care | Payer: BC Managed Care – PPO | Source: Ambulatory Visit | Attending: Oral Surgery | Admitting: Oral Surgery

## 2019-03-19 ENCOUNTER — Other Ambulatory Visit: Payer: Self-pay

## 2019-03-19 DIAGNOSIS — M8718 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, jaw: Secondary | ICD-10-CM | POA: Diagnosis not present

## 2019-03-19 DIAGNOSIS — Z905 Acquired absence of kidney: Secondary | ICD-10-CM | POA: Diagnosis not present

## 2019-03-19 DIAGNOSIS — I10 Essential (primary) hypertension: Secondary | ICD-10-CM | POA: Diagnosis not present

## 2019-03-19 DIAGNOSIS — I252 Old myocardial infarction: Secondary | ICD-10-CM | POA: Diagnosis not present

## 2019-03-19 DIAGNOSIS — Z955 Presence of coronary angioplasty implant and graft: Secondary | ICD-10-CM | POA: Diagnosis not present

## 2019-03-19 DIAGNOSIS — E785 Hyperlipidemia, unspecified: Secondary | ICD-10-CM | POA: Diagnosis not present

## 2019-03-19 DIAGNOSIS — Z8049 Family history of malignant neoplasm of other genital organs: Secondary | ICD-10-CM | POA: Diagnosis not present

## 2019-03-19 DIAGNOSIS — T458X5A Adverse effect of other primarily systemic and hematological agents, initial encounter: Secondary | ICD-10-CM | POA: Diagnosis not present

## 2019-03-19 DIAGNOSIS — F17210 Nicotine dependence, cigarettes, uncomplicated: Secondary | ICD-10-CM | POA: Diagnosis not present

## 2019-03-19 DIAGNOSIS — Z87442 Personal history of urinary calculi: Secondary | ICD-10-CM | POA: Diagnosis not present

## 2019-03-19 DIAGNOSIS — I2510 Atherosclerotic heart disease of native coronary artery without angina pectoris: Secondary | ICD-10-CM | POA: Diagnosis not present

## 2019-03-19 DIAGNOSIS — Z20828 Contact with and (suspected) exposure to other viral communicable diseases: Secondary | ICD-10-CM | POA: Insufficient documentation

## 2019-03-19 DIAGNOSIS — I739 Peripheral vascular disease, unspecified: Secondary | ICD-10-CM | POA: Diagnosis not present

## 2019-03-19 DIAGNOSIS — Z01812 Encounter for preprocedural laboratory examination: Secondary | ICD-10-CM | POA: Diagnosis present

## 2019-03-19 DIAGNOSIS — Z79899 Other long term (current) drug therapy: Secondary | ICD-10-CM | POA: Diagnosis not present

## 2019-03-19 DIAGNOSIS — Z818 Family history of other mental and behavioral disorders: Secondary | ICD-10-CM | POA: Diagnosis not present

## 2019-03-19 DIAGNOSIS — Z825 Family history of asthma and other chronic lower respiratory diseases: Secondary | ICD-10-CM | POA: Diagnosis not present

## 2019-03-19 DIAGNOSIS — J449 Chronic obstructive pulmonary disease, unspecified: Secondary | ICD-10-CM | POA: Diagnosis not present

## 2019-03-19 DIAGNOSIS — Z951 Presence of aortocoronary bypass graft: Secondary | ICD-10-CM | POA: Diagnosis not present

## 2019-03-19 DIAGNOSIS — K219 Gastro-esophageal reflux disease without esophagitis: Secondary | ICD-10-CM | POA: Diagnosis not present

## 2019-03-19 DIAGNOSIS — B9620 Unspecified Escherichia coli [E. coli] as the cause of diseases classified elsewhere: Secondary | ICD-10-CM | POA: Diagnosis not present

## 2019-03-19 DIAGNOSIS — Z7982 Long term (current) use of aspirin: Secondary | ICD-10-CM | POA: Diagnosis not present

## 2019-03-19 LAB — BASIC METABOLIC PANEL
Anion gap: 11 (ref 5–15)
BUN: 20 mg/dL (ref 8–23)
CO2: 24 mmol/L (ref 22–32)
Calcium: 9.6 mg/dL (ref 8.9–10.3)
Chloride: 105 mmol/L (ref 98–111)
Creatinine, Ser: 1.06 mg/dL — ABNORMAL HIGH (ref 0.44–1.00)
GFR calc Af Amer: >60 mL/min (ref >60)
GFR calc non Af Amer: 54 mL/min — ABNORMAL LOW (ref >60)
Glucose, Bld: 97 mg/dL (ref 70–99)
Potassium: 4 mmol/L (ref 3.5–5.1)
Sodium: 140 mmol/L (ref 135–145)

## 2019-03-21 LAB — NOVEL CORONAVIRUS, NAA (HOSP ORDER, SEND-OUT TO REF LAB; TAT 18-24 HRS): SARS-CoV-2, NAA: NOT DETECTED

## 2019-03-22 ENCOUNTER — Other Ambulatory Visit: Payer: Self-pay

## 2019-03-22 ENCOUNTER — Encounter (HOSPITAL_COMMUNITY): Payer: Self-pay | Admitting: *Deleted

## 2019-03-22 NOTE — Progress Notes (Signed)
Patient denies shortness of breath, fever, cough and chest pain.  PCP - Dr Vic Blackbird Cardiologist - Dr Minus Breeding  Chest x-ray - Denies EKG - 03/19/19 Stress Test - 04/28/15 ECHO - 05/13/17 Cardiac Cath -05/12/17  Aspirin Instructions: Follow your surgeon's instructions on when to stop aspirin prior to surgery,  If no instructions were given by your surgeon then you will need to call the office for those instructions.  Anesthesia review: Yes, Ebony Hail, PA  STOP now taking any Aspirin (unless otherwise instructed by your surgeon), Aleve, Naproxen, Ibuprofen, Motrin, Advil, Goody's, BC's, all herbal medications, fish oil, and all vitamins.   Coronavirus Screening Have you experienced the following symptoms:  Covid test on 03/19/19 was negative.  Cough yes/no: No Fever (>100.64F)  yes/no: No Runny nose yes/no: No Sore throat yes/no: No Difficulty breathing/shortness of breath  yes/no: No  Have you traveled in the last 14 days and where? yes/no: No  Patient verbalized understanding of instructions that were given to her via phone.

## 2019-03-22 NOTE — Progress Notes (Signed)
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  It appears surgery was initially scheduled at the [place].   History includes smoking, COPD, CAD (CABG: LIMA-LAD 05/20/95; STEMI, s/p DES mid LAD and RI 05/12/17), PAD/left subclavian occlusion (s/p left CCA-to-SCA bypass with 59mm Hemashield graft 04/14/07), left staghorn calculus (s/p left nephrectomy), HTN, HLD, GERD.  Last seen by Dr. Percival Spanish 10/19/18. No further CV testing felt indicated at that time. On ASA, but Brilinta has been stopped given time since DES. She was not interested in smoking cessation. One year follow-up recommended.   03/19/19 COVID-19 test negative.  PAT phone RN still attempting to contact patient. Appears a BMET and EKG were done on 03/19/19 as part of [facility] work-up. Now case is scheduled at [place]. Anesthesia team to evaluate on the day of surgery.    PROVIDERS: [place], Modena Nunnery, MD Minus Breeding, MD is cardiologist.     LABS: BMET results acceptable from 03/19/19. CBC WNL 06/22/18.   (all labs ordered are listed, but only abnormal results are displayed)  Labs Reviewed  BASIC METABOLIC PANEL - Abnormal; Notable for the following components:      Result Value   Creatinine, Ser 1.06 (*)    GFR calc non Af Amer 54 (*)    All other components within normal limits      EKG: 03/19/19: Normal sinus rhythm Nonspecific ST and T wave abnormality Abnormal ECG Confirmed by Buford Dresser [phone]) on 03/19/2019 10:23:50 PM   CV: Echo 05/13/17: Study Conclusions - Left ventricle: The cavity size was normal. Wall thickness was   increased in  a pattern of mild LVH. Systolic function was mildly   reduced. The estimated ejection fraction was in the range of 45%   to 50%. Distal anterior, apical, apical septal, and inferoapical   hypokinesis suggestive of LAD territory ischemia/infarct. Doppler   parameters are consistent with abnormal left ventricular   relaxation (grade 1 diastolic dysfunction). The E/e&' ratio is   between 8-15, suggesting indeterminate LV filling pressure. - Mitral valve: Mildly thickened leaflets . There was trivial   regurgitation. - Left atrium: The atrium was normal in size. - Tricuspid valve: There was trivial regurgitation. - Pulmonary arteries: PA peak pressure: 16 mm Hg (S). - Inferior vena cava: The vessel was normal in size. The   respirophasic diameter changes were in the normal range (>= 50%),   consistent with normal central venous pressure. Impressions: - LVEF 45-50%, mild LVH, distal anterior, apical, apical septal and   inferoapical hypokinesis, grade 1 DD, indeterminate LV filling   pressure, trivial MR, normal LA size, trivial TR, RVSP 16 mmHg,   normal IVC.  Cardiac cath 05/12/17: LM: Normal LAD: 30% proximal. 60% mid-distal. 95% mid. Critical lesion in the mid LAD with late filling of the wraparound apical portion of the LAD. Angiographic characteristics suggestive of the patient's culprit lesion based on EKG changes in this location and ulceration at the lesion site. RI: 90% ostial.  LCX: Minimal luminal irregularities RCA: Minimal luminal irregularities LIMA-LAD: LIMA and is normal in caliber. The graft exhibits no disease. the LIMA is patent  and visualized retrograde from the native LCA injections. The LIMA is not injected selectively because of known severe subclavian stenosis. Conclusions: 1.  Severe mid LAD stenosis treated successfully with PCI using a Synergy DES treated through the native vessel and stented just beyond the LIMA insertion site 2.  Severe ramus intermedius  stenosis treated successfully with PCI using a single drug-eluting stent 3.  Patent left main, left circumflex, and RCA 4.  Mild segmental contraction abnormality of the left ventricle with periapical hypokinesis, LVEF estimated at 45-50% Recommendations: Post MI medical therapy, aspirin and Brilinta for at least 12 months without interruption.   Abdominal Aorta US 10/16/16: Impressions Normal caliber abdominal aorta, common and external iliac arteries, without focal dilatation. Aorto-iliac atherosclerosis. >50% bilateral common and right external iliac artery stenosis. Normal left external iliac artery. Patent IVC.  Carotid US 03/21/16:  Impressions Heterogeneous plaque, bilaterally. Stable 1-39% bilateral ICA stenosis. Normal subclavian arteries, bilaterally. Patent right vertebral artery with antegrade flow. Retrograde flow in the left vertebral artery.  S/p left CCA to subclavian bypass graft, which is patent with low flow velocity.  Known LIMA graft.   Past Medical History:  Diagnosis Date  . Allergy   . COPD (chronic obstructive pulmonary disease) (HCC)   . Coronary artery disease 05/16/1995   Cathed in November, 05/20/1995--CABG x 1-----LIMA to LAD, 11/18 STEMI PCI/DES pLAD, EF 45-50%  . GERD (gastroesophageal reflux disease)   . Hyperlipidemia   . Hypertension   . Myocardial infarction (HCC)   . Nephrolithiasis    Left Nephrectomy   . Peripheral vascular disease (HCC)    carotid to subclavian artery bypass graft  . Smoker     Past Surgical History:  Procedure Laterality Date  . CAROTID ARTERY - SUBCLAVIAN ARTERY BYPASS GRAFT Left 04/18/2007  . CORONARY ARTERY BYPASS GRAFT    . CORONARY/GRAFT ACUTE MI REVASCULARIZATION N/A 05/12/2017   Procedure: Coronary/Graft Acute MI Revascularization;  Surgeon: Tonny Bollman, MD;  Location: [facility]AS[facility] CV LAB;  Service: Cardiovascular;  Laterality: N/A;  . KIDNEY SURGERY Left    68 yo  . LEFT HEART CATH AND CORONARY  ANGIOGRAPHY N/A 05/12/2017   Procedure: LEFT HEART CATH AND CORONARY ANGIOGRAPHY;  Surgeon: Tonny Bollman, MD;  Location: [facility] INVASIVE CV LAB;  Service: Cardiovascular;  Laterality: N/A;  . Left Nephrectomy Left 10/17/1983   Dr. Sural---StagHornCalculus, Left Kidney Nonfunctioning wiht Acute and Chronic Pyelonephritis    MEDICATIONS: No current facility-administered medications for this encounter.    . Cholecalciferol (VITAMIN D3) 2000 units capsule  . lisinopril (PRINIVIL,ZESTRIL) 20 MG tablet  . omeprazole (PRILOSEC) 10 MG capsule  . vitamin B-12 (CYANOCOBALAMIN) 100 MCG tablet  . vitamin E 100 UNIT capsule  . alendronate (FOSAMAX) 70 MG tablet  . ALPRAZolam (XANAX) 0.25 MG tablet  . amLODipine (NORVASC) 10 MG tablet  . aspirin 81 MG tablet  . carvedilol (COREG) 3.125 MG tablet  . CHANTIX 1 MG tablet  . nitroGLYCERIN (NITROSTAT) 0.4 MG SL tablet  . rosuvastatin (CRESTOR) 40 MG tablet  . triamcinolone cream (KENALOG) 0.1 %     Shonna Chock, PA-C Surgical Short Stay/Anest[facility]iology [facility] Phone [phone] [facility] Phone [phone] 03/22/2019 4:10 PM

## 2019-03-22 NOTE — Anesthesia Preprocedure Evaluation (Addendum)
Anesthesia Evaluation  Patient identified by MRN, date of birth, ID band Patient awake    Reviewed: Allergy & Precautions, NPO status , Patient's Chart, lab work & pertinent test results  Airway Mallampati: II  TM Distance: >3 FB Neck ROM: Full    Dental  (+) Edentulous Upper, Dental Advisory Given   Pulmonary Current Smoker,    breath sounds clear to auscultation       Cardiovascular hypertension,  Rhythm:Regular Rate:Normal     Neuro/Psych    GI/Hepatic   Endo/Other    Renal/GU      Musculoskeletal   Abdominal   Peds  Hematology   Anesthesia Other Findings   Reproductive/Obstetrics                            Anesthesia Physical Anesthesia Plan  ASA: III  Anesthesia Plan: General   Post-op Pain Management:    Induction: Intravenous  PONV Risk Score and Plan: Ondansetron and Dexamethasone  Airway Management Planned: Nasal ETT  Additional Equipment:   Intra-op Plan:   Post-operative Plan: Extubation in OR  Informed Consent: I have reviewed the patients History and Physical, chart, labs and discussed the procedure including the risks, benefits and alternatives for the proposed anesthesia with the patient or authorized representative who has indicated his/her understanding and acceptance.     Dental advisory given  Plan Discussed with: CRNA and Anesthesiologist  Anesthesia Plan Comments: (PAT note written 03/22/2019 by Myra Gianotti, PA-C. )       Anesthesia Quick Evaluation

## 2019-03-23 ENCOUNTER — Ambulatory Visit (HOSPITAL_COMMUNITY)
Admission: RE | Admit: 2019-03-23 | Discharge: 2019-03-23 | Disposition: A | Discharge status: Home / Self Care | Payer: BC Managed Care – PPO | Attending: Oral Surgery | Admitting: Oral Surgery

## 2019-03-23 ENCOUNTER — Ambulatory Visit (HOSPITAL_COMMUNITY): Payer: BC Managed Care – PPO | Admitting: Anesthesiology

## 2019-03-23 ENCOUNTER — Encounter (HOSPITAL_COMMUNITY)
Admission: RE | Disposition: A | Discharge status: Home / Self Care | Payer: Self-pay | Source: Home / Self Care | Attending: Oral Surgery

## 2019-03-23 ENCOUNTER — Encounter (HOSPITAL_COMMUNITY): Payer: Self-pay | Admitting: Surgery

## 2019-03-23 DIAGNOSIS — K219 Gastro-esophageal reflux disease without esophagitis: Secondary | ICD-10-CM | POA: Insufficient documentation

## 2019-03-23 DIAGNOSIS — J449 Chronic obstructive pulmonary disease, unspecified: Secondary | ICD-10-CM | POA: Insufficient documentation

## 2019-03-23 DIAGNOSIS — Z79899 Other long term (current) drug therapy: Secondary | ICD-10-CM | POA: Insufficient documentation

## 2019-03-23 DIAGNOSIS — F17210 Nicotine dependence, cigarettes, uncomplicated: Secondary | ICD-10-CM | POA: Insufficient documentation

## 2019-03-23 DIAGNOSIS — T458X5A Adverse effect of other primarily systemic and hematological agents, initial encounter: Secondary | ICD-10-CM | POA: Insufficient documentation

## 2019-03-23 DIAGNOSIS — Z951 Presence of aortocoronary bypass graft: Secondary | ICD-10-CM | POA: Insufficient documentation

## 2019-03-23 DIAGNOSIS — I2510 Atherosclerotic heart disease of native coronary artery without angina pectoris: Secondary | ICD-10-CM | POA: Insufficient documentation

## 2019-03-23 DIAGNOSIS — Z905 Acquired absence of kidney: Secondary | ICD-10-CM | POA: Insufficient documentation

## 2019-03-23 DIAGNOSIS — Z955 Presence of coronary angioplasty implant and graft: Secondary | ICD-10-CM | POA: Insufficient documentation

## 2019-03-23 DIAGNOSIS — B9620 Unspecified Escherichia coli [E. coli] as the cause of diseases classified elsewhere: Secondary | ICD-10-CM | POA: Insufficient documentation

## 2019-03-23 DIAGNOSIS — Z8049 Family history of malignant neoplasm of other genital organs: Secondary | ICD-10-CM | POA: Insufficient documentation

## 2019-03-23 DIAGNOSIS — Z7982 Long term (current) use of aspirin: Secondary | ICD-10-CM | POA: Insufficient documentation

## 2019-03-23 DIAGNOSIS — M8718 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, jaw: Secondary | ICD-10-CM | POA: Diagnosis not present

## 2019-03-23 DIAGNOSIS — I10 Essential (primary) hypertension: Secondary | ICD-10-CM | POA: Insufficient documentation

## 2019-03-23 DIAGNOSIS — I252 Old myocardial infarction: Secondary | ICD-10-CM | POA: Insufficient documentation

## 2019-03-23 DIAGNOSIS — E785 Hyperlipidemia, unspecified: Secondary | ICD-10-CM | POA: Insufficient documentation

## 2019-03-23 DIAGNOSIS — Z825 Family history of asthma and other chronic lower respiratory diseases: Secondary | ICD-10-CM | POA: Insufficient documentation

## 2019-03-23 DIAGNOSIS — I739 Peripheral vascular disease, unspecified: Secondary | ICD-10-CM | POA: Insufficient documentation

## 2019-03-23 DIAGNOSIS — Z818 Family history of other mental and behavioral disorders: Secondary | ICD-10-CM | POA: Insufficient documentation

## 2019-03-23 DIAGNOSIS — Z87442 Personal history of urinary calculi: Secondary | ICD-10-CM | POA: Insufficient documentation

## 2019-03-23 HISTORY — DX: Gastro-esophageal reflux disease without esophagitis: K21.9

## 2019-03-23 HISTORY — DX: Peripheral vascular disease, unspecified: I73.9

## 2019-03-23 HISTORY — PX: DEBRIDEMENT MANDIBLE: SHX5308

## 2019-03-23 HISTORY — DX: Personal history of urinary calculi: Z87.442

## 2019-03-23 LAB — BASIC METABOLIC PANEL
Anion gap: 6 (ref 5–15)
BUN: 16 mg/dL (ref 8–23)
CO2: 25 mmol/L (ref 22–32)
Calcium: 9.8 mg/dL (ref 8.9–10.3)
Chloride: 108 mmol/L (ref 98–111)
Creatinine, Ser: 1.1 mg/dL — ABNORMAL HIGH (ref 0.44–1.00)
GFR calc Af Amer: >60 mL/min (ref >60)
GFR calc non Af Amer: 52 mL/min — ABNORMAL LOW (ref >60)
Glucose, Bld: 96 mg/dL (ref 70–99)
Potassium: 4.2 mmol/L (ref 3.5–5.1)
Sodium: 139 mmol/L (ref 135–145)

## 2019-03-23 LAB — CBC
HCT: 41 % (ref 36.0–46.0)
Hemoglobin: 13.6 g/dL (ref 12.0–15.0)
MCH: 31.9 pg (ref 26.0–34.0)
MCHC: 33.2 g/dL (ref 30.0–36.0)
MCV: 96.2 fL (ref 80.0–100.0)
Platelets: 256 10*3/uL (ref 150–400)
RBC: 4.26 MIL/uL (ref 3.87–5.11)
RDW: 14.6 % (ref 11.5–15.5)
WBC: 7.3 10*3/uL (ref 4.0–10.5)
nRBC: 0 % (ref 0.0–0.2)

## 2019-03-23 SURGERY — DEBRIDEMENT, MANDIBLE
Anesthesia: General | Site: Head | Laterality: Left

## 2019-03-23 MED ORDER — LIDOCAINE 2% (20 MG/ML) 5 ML SYRINGE
INTRAMUSCULAR | Status: DC | PRN
  Administered 2019-03-23: 40 mg via INTRAVENOUS

## 2019-03-23 MED ORDER — ESMOLOL HCL 100 MG/10ML IV SOLN
INTRAVENOUS | Status: DC | PRN
  Administered 2019-03-23: 20 mg via INTRAVENOUS

## 2019-03-23 MED ORDER — LACTATED RINGERS IV SOLN
INTRAVENOUS | Status: DC | PRN
  Administered 2019-03-23: 07:00:00 via INTRAVENOUS

## 2019-03-23 MED ORDER — SODIUM CHLORIDE 0.9 % IV SOLN
INTRAVENOUS | Status: DC | PRN
  Administered 2019-03-23: 50 ug/min via INTRAVENOUS

## 2019-03-23 MED ORDER — HEMOSTATIC AGENTS (NO CHARGE) OPTIME
TOPICAL | Status: DC | PRN
  Administered 2019-03-23: 1 via TOPICAL

## 2019-03-23 MED ORDER — OXYMETAZOLINE HCL 0.05 % NA SOLN
NASAL | Status: DC | PRN
  Administered 2019-03-23: 3 via NASAL

## 2019-03-23 MED ORDER — MIDAZOLAM HCL 5 MG/5ML IJ SOLN
INTRAMUSCULAR | Status: DC | PRN
  Administered 2019-03-23: 1 mg via INTRAVENOUS

## 2019-03-23 MED ORDER — ONDANSETRON HCL 4 MG/2ML IJ SOLN
INTRAMUSCULAR | Status: AC
  Filled 2019-03-23: qty 2

## 2019-03-23 MED ORDER — OXYMETAZOLINE HCL 0.05 % NA SOLN
NASAL | Status: AC
  Filled 2019-03-23: qty 30

## 2019-03-23 MED ORDER — BUPIVACAINE-EPINEPHRINE (PF) 0.5% -1:200000 IJ SOLN
INTRAMUSCULAR | Status: AC
  Filled 2019-03-23: qty 3.6

## 2019-03-23 MED ORDER — AMOXICILLIN-POT CLAVULANATE 875-125 MG PO TABS: 1.0000 | ORAL_TABLET | Freq: Two times a day (BID) | ORAL | 0 refills | Status: AC

## 2019-03-23 MED ORDER — BACITRACIN ZINC 500 UNIT/GM EX OINT
TOPICAL_OINTMENT | CUTANEOUS | Status: AC
  Filled 2019-03-23: qty 28.35

## 2019-03-23 MED ORDER — ONDANSETRON HCL 4 MG/2ML IJ SOLN
INTRAMUSCULAR | Status: DC | PRN
  Administered 2019-03-23: 4 mg via INTRAVENOUS

## 2019-03-23 MED ORDER — CHLORHEXIDINE GLUCONATE 0.12 % MT SOLN: 15.0000 mL | Freq: Three times a day (TID) | OROMUCOSAL | 3 refills | Status: DC

## 2019-03-23 MED ORDER — LIDOCAINE-EPINEPHRINE 1 %-1:100000 IJ SOLN
INTRAMUSCULAR | Status: AC
  Filled 2019-03-23: qty 1

## 2019-03-23 MED ORDER — MIDAZOLAM HCL 2 MG/2ML IJ SOLN
INTRAMUSCULAR | Status: AC
  Filled 2019-03-23: qty 2

## 2019-03-23 MED ORDER — HYDROCODONE-ACETAMINOPHEN 5-325 MG PO TABS: 1.0000 | ORAL_TABLET | ORAL | 0 refills | Status: DC | PRN

## 2019-03-23 MED ORDER — FENTANYL CITRATE (PF) 250 MCG/5ML IJ SOLN
INTRAMUSCULAR | Status: DC | PRN
  Administered 2019-03-23 (×3): 50 ug via INTRAVENOUS

## 2019-03-23 MED ORDER — LIDOCAINE-EPINEPHRINE 1 %-1:100000 IJ SOLN
INTRAMUSCULAR | Status: DC | PRN
  Administered 2019-03-23: 20 mL

## 2019-03-23 MED ORDER — BUPIVACAINE-EPINEPHRINE 0.25% -1:200000 IJ SOLN
INTRAMUSCULAR | Status: DC | PRN
  Administered 2019-03-23: 10 mL

## 2019-03-23 MED ORDER — PROPOFOL 10 MG/ML IV BOLUS
INTRAVENOUS | Status: DC | PRN
  Administered 2019-03-23: 120 mg via INTRAVENOUS

## 2019-03-23 MED ORDER — BUPIVACAINE-EPINEPHRINE (PF) 0.25% -1:200000 IJ SOLN
INTRAMUSCULAR | Status: AC
  Filled 2019-03-23: qty 30

## 2019-03-23 MED ORDER — ROCURONIUM BROMIDE 10 MG/ML (PF) SYRINGE
PREFILLED_SYRINGE | INTRAVENOUS | Status: DC | PRN
  Administered 2019-03-23: 20 mg via INTRAVENOUS

## 2019-03-23 MED ORDER — FENTANYL CITRATE (PF) 250 MCG/5ML IJ SOLN
INTRAMUSCULAR | Status: AC
  Filled 2019-03-23: qty 5

## 2019-03-23 MED ORDER — SUGAMMADEX SODIUM 200 MG/2ML IV SOLN
INTRAVENOUS | Status: DC | PRN
  Administered 2019-03-23: 200 mg via INTRAVENOUS

## 2019-03-23 MED ORDER — CEFAZOLIN SODIUM-DEXTROSE 2-4 GM/100ML-% IV SOLN
2.0000 g | INTRAVENOUS | Status: AC
  Administered 2019-03-23: 2 g via INTRAVENOUS
  Filled 2019-03-23: qty 100

## 2019-03-23 MED ORDER — ALBUTEROL SULFATE HFA 108 (90 BASE) MCG/ACT IN AERS
INHALATION_SPRAY | RESPIRATORY_TRACT | Status: DC | PRN
  Administered 2019-03-23: 2 via RESPIRATORY_TRACT

## 2019-03-23 MED ORDER — PHENYLEPHRINE 40 MCG/ML (10ML) SYRINGE FOR IV PUSH (FOR BLOOD PRESSURE SUPPORT)
PREFILLED_SYRINGE | INTRAVENOUS | Status: DC | PRN
  Administered 2019-03-23: 80 ug via INTRAVENOUS

## 2019-03-23 MED ORDER — PHENYLEPHRINE 40 MCG/ML (10ML) SYRINGE FOR IV PUSH (FOR BLOOD PRESSURE SUPPORT)
PREFILLED_SYRINGE | INTRAVENOUS | Status: AC
  Filled 2019-03-23: qty 10

## 2019-03-23 MED ORDER — DEXAMETHASONE SODIUM PHOSPHATE 10 MG/ML IJ SOLN
INTRAMUSCULAR | Status: DC | PRN
  Administered 2019-03-23: 8 mg via INTRAVENOUS

## 2019-03-23 MED ORDER — 0.9 % SODIUM CHLORIDE (POUR BTL) OPTIME
TOPICAL | Status: DC | PRN
  Administered 2019-03-23: 1000 mL

## 2019-03-23 SURGICAL SUPPLY — 53 items
ATTRACTOMAT 16X20 MAGNETIC DRP (DRAPES) ×2 IMPLANT
BLADE SURG 15 STRL LF DISP TIS (BLADE) IMPLANT
BLADE SURG 15 STRL SS (BLADE)
BUR ROUND FLUTED 5 RND (BURR) ×1 IMPLANT
BUR ROUND FLUTED 5MM RND (BURR) ×1
CANISTER SUCT 3000ML PPV (MISCELLANEOUS) ×3 IMPLANT
CLEANER TIP ELECTROSURG 2X2 (MISCELLANEOUS) ×3 IMPLANT
COVER SURGICAL LIGHT HANDLE (MISCELLANEOUS) ×3 IMPLANT
COVER WAND RF STERILE (DRAPES) ×3 IMPLANT
DRAPE HALF SHEET 40X57 (DRAPES) IMPLANT
ELECT COATED BLADE 2.86 ST (ELECTRODE) ×3 IMPLANT
ELECT NDL TIP 2.8 STRL (NEEDLE) IMPLANT
ELECT NEEDLE TIP 2.8 STRL (NEEDLE) IMPLANT
ELECT REM PT RETURN 9FT ADLT (ELECTROSURGICAL) ×3
ELECTRODE REM PT RTRN 9FT ADLT (ELECTROSURGICAL) ×1 IMPLANT
GLOVE ORTHO TXT STRL SZ7.5 (GLOVE) ×3 IMPLANT
GOWN STRL REUS W/ TWL LRG LVL3 (GOWN DISPOSABLE) ×2 IMPLANT
GOWN STRL REUS W/TWL LRG LVL3 (GOWN DISPOSABLE) ×6
KIT BASIN OR (CUSTOM PROCEDURE TRAY) ×3 IMPLANT
KIT TURNOVER KIT B (KITS) ×3 IMPLANT
NDL BLUNT 18X1 FOR OR ONLY (NEEDLE) ×1 IMPLANT
NDL HYPO 25GX1X1/2 BEV (NEEDLE) IMPLANT
NEEDLE BLUNT 18X1 FOR OR ONLY (NEEDLE) ×3 IMPLANT
NEEDLE HYPO 25GX1X1/2 BEV (NEEDLE) ×3 IMPLANT
NS IRRIG 1000ML POUR BTL (IV SOLUTION) ×3 IMPLANT
PAD ARMBOARD 7.5X6 YLW CONV (MISCELLANEOUS) ×6 IMPLANT
PATTIES SURGICAL .5 X3 (DISPOSABLE) IMPLANT
PENCIL BUTTON HOLSTER BLD 10FT (ELECTRODE) ×3 IMPLANT
SCISSORS WIRE DISP (INSTRUMENTS) ×3 IMPLANT
SPONGE SURGIFOAM ABS GEL 12-7 (HEMOSTASIS) ×2 IMPLANT
STAPLER VISISTAT 35W (STAPLE) ×3 IMPLANT
SUCTION FRAZIER HANDLE 10FR (MISCELLANEOUS)
SUCTION FRAZIER TIP 8 FR DISP (SUCTIONS)
SUCTION TUBE FRAZIER 10FR DISP (MISCELLANEOUS) IMPLANT
SUCTION TUBE FRAZIER 8FR DISP (SUCTIONS) IMPLANT
SUT BONE WAX W31G (SUTURE) IMPLANT
SUT CHROMIC 3 0 SH 27 (SUTURE) ×2 IMPLANT
SUT ETHILON 3 0 PS 1 (SUTURE) IMPLANT
SUT SILK 3 0 (SUTURE)
SUT SILK 3 0 SH 30 (SUTURE) IMPLANT
SUT SILK 3-0 18XBRD TIE 12 (SUTURE) IMPLANT
SUT STEEL 0 (SUTURE)
SUT STEEL 0 18XMFL TIE 17 (SUTURE) IMPLANT
SUT STEEL 2 (SUTURE) IMPLANT
SUT VIC AB 3-0 FS2 27 (SUTURE) IMPLANT
SUT VIC AB 4-0 P-3 18X BRD (SUTURE) IMPLANT
SUT VIC AB 4-0 P3 18 (SUTURE)
SUT VIC AB 5-0 P-3 18XBRD (SUTURE) IMPLANT
SUT VIC AB 5-0 P3 18 (SUTURE)
SYR 50ML LL SCALE MARK (SYRINGE) ×3 IMPLANT
SYR CONTROL 10ML LL (SYRINGE) ×2 IMPLANT
TRAY ENT MC OR (CUSTOM PROCEDURE TRAY) ×3 IMPLANT
WATER STERILE IRR 1000ML POUR (IV SOLUTION) ×3 IMPLANT

## 2019-03-23 NOTE — Interval H&P Note (Signed)
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  The various methods of treatment have been discussed with the patient and family. After consideration of risks, benefits and other options for treatment, the patient has consented to  Procedure(s): DEBRIDEMENT LEFT MANDIBLE,SEQUESTRECTOMY (Left) as a surgical intervention.  The patient's history has been reviewed, patient examined, no change in status, stable for surgery.  I have reviewed the patient's chart and labs.  Questions were answered to the patient's satisfaction.     Larkin Ina Jaymeson Mengel

## 2019-03-23 NOTE — Anesthesia Procedure Notes (Addendum)
Procedure Name: Intubation Date/Time: 03/23/2019 7:51 AM Performed by: Mariea Clonts, CRNA Pre-anesthesia Checklist: Patient identified, Emergency Drugs available, Suction available and Patient being monitored Patient Re-evaluated:Patient Re-evaluated prior to induction Oxygen Delivery Method: Circle System Utilized Preoxygenation: Pre-oxygenation with 100% oxygen Induction Type: IV induction Ventilation: Mask ventilation without difficulty Laryngoscope Size: Mac and 3 Grade View: Grade I Nasal Tubes: Nasal Rae, Nasal prep performed, Magill forceps - small, utilized and Left Tube size: 7.0 mm Number of attempts: 1 Placement Confirmation: ETT inserted through vocal cords under direct vision,  positive ETCO2 and breath sounds checked- equal and bilateral Tube secured with: Tape Dental Injury: Teeth and Oropharynx as per pre-operative assessment

## 2019-03-23 NOTE — Anesthesia Postprocedure Evaluation (Signed)
Anesthesia Post Note  Patient: [name]  Procedure(s) Performed: DEBRIDEMENT LEFT MANDIBLE,SEQUESTRECTOMY (Left Head)     Patient location during evaluation: PACU Anesthesia Type: General Level of consciousness: awake and alert Pain management: pain level controlled Vital Signs Assessment: post-procedure vital signs reviewed and stable Respiratory status: spontaneous breathing, nonlabored ventilation, respiratory function stable and patient connected to nasal cannula oxygen Cardiovascular status: blood pressure returned to baseline and stable Postop Assessment: no apparent nausea or vomiting Anesthetic complications: no    Last Vitals:  Vitals:   03/23/19 0947 03/23/19 0948  BP:    Pulse: 80 79  Resp: 16 16  Temp:    SpO2: 93% 94%    Last Pain:  Vitals:   03/23/19 0915  TempSrc:   PainSc: Asleep                 Garreth Burnsworth COKER

## 2019-03-23 NOTE — H&P (Signed)
[name] is an 68 y.o. female.   Chief Complaint: left mandibular pain/swelling/infection HPI: Pt is a 68 y/o F with h/o copd, CAD, HTN, GERD with previous tooth removal by general dentist approx 10 months ago. She states that she never healed completely. He presented with left facial swelling/pain, c/o foul taste in mouth. No dyspnea/dysphagia. H/o bisphosphonate use - Fosamax.  Past Medical History:  Diagnosis Date  . Allergy   . COPD (chronic obstructive pulmonary disease) (HCC)   . Coronary artery disease 05/16/1995   Cathed in November, 05/20/1995--CABG x 1-----LIMA to LAD, 11/18 STEMI PCI/DES pLAD, EF 45-50%  . GERD (gastroesophageal reflux disease)   . History of kidney stones    surgery left kidney removed  . Hyperlipidemia   . Hypertension   . Myocardial infarction (HCC)   . Nephrolithiasis    Left Nephrectomy   . Peripheral vascular disease (HCC)    carotid to subclavian artery bypass graft  . Smoker     Past Surgical History:  Procedure Laterality Date  . CAROTID ARTERY - SUBCLAVIAN ARTERY BYPASS GRAFT Left 04/18/2007  . COLONOSCOPY  2008  . CORONARY ARTERY BYPASS GRAFT    . CORONARY/GRAFT ACUTE MI REVASCULARIZATION N/A 05/12/2017   Procedure: Coronary/Graft Acute MI Revascularization;  Surgeon: Tonny Bollman, MD;  Location: [facility] INVASIVE CV LAB;  Service: Cardiovascular;  Laterality: N/A;  . KIDNEY SURGERY Left    68 yo  . LEFT HEART CATH AND CORONARY ANGIOGRAPHY N/A 05/12/2017   Procedure: LEFT HEART CATH AND CORONARY ANGIOGRAPHY;  Surgeon: Tonny Bollman, MD;  Location: [facility] INVASIVE CV LAB;  Service: Cardiovascular;  Laterality: N/A;  . Left Nephrectomy Left 10/17/1983   Dr. Sural---StagHornCalculus, Left Kidney Nonfunctioning wiht Acute and Chronic Pyelonephritis  . WISDOM TOOTH EXTRACTION      Family History  Problem Relation Age of Onset  . Depression Sister   . COPD Father   . Cancer Sister        hysterectomy sec to cancer  . Cancer Sister    cervical cancer  . COPD Brother    Social History:  reports that she has been smoking cigarettes. She has a 10.00 pack-year smoking history. She has never used smokeless tobacco. She reports current alcohol use. She reports that she does not use drugs.  Allergies: No Known Allergies  Medications Prior to Admission  Medication Sig Dispense Refill  . aspirin 81 MG tablet Take 81 mg by mouth daily.    . Cholecalciferol (VITAMIN D3) 2000 units capsule Take 1 capsule (2,000 Units total) by mouth daily.    Marland Kitchen lisinopril (PRINIVIL,ZESTRIL) 20 MG tablet Take 1 tablet (20 mg total) by mouth daily. 90 tablet 1  . omeprazole (PRILOSEC) 10 MG capsule Take 10 mg by mouth daily.    . vitamin B-12 (CYANOCOBALAMIN) 100 MCG tablet Take 100 mcg by mouth daily.    . vitamin E 100 UNIT capsule Take by mouth daily.    Marland Kitchen alendronate (FOSAMAX) 70 MG tablet TAKE 1 TABLET BY MOUTH ONCE WEEKLY TAKE WITH A FULL GLASS OF WATER ON AN EMPTY STOMACH 12 tablet 3  . ALPRAZolam (XANAX) 0.25 MG tablet TAKE 1 TABLET BY MOUTH EVERY DAY 30 tablet 1  . amLODipine (NORVASC) 10 MG tablet TAKE 1 TABLET BY MOUTH EVERY DAY 90 tablet 3  . carvedilol (COREG) 3.125 MG tablet Take 1 tablet (3.125 mg total) by mouth 2 (two) times daily with a meal. 180 tablet 3  . CHANTIX 1 MG tablet TAKE 1 TABLET BY  MOUTH TWICE A DAY 180 tablet 1  . nitroGLYCERIN (NITROSTAT) 0.4 MG SL tablet PLACE 1 TABLET ON THE TONGUE &LET DISSOLVE EVERY 5 MINS AS NEEDED FOR CHEST PAIN 25 tablet 1  . rosuvastatin (CRESTOR) 40 MG tablet Take 1 tablet (40 mg total) by mouth daily. 90 tablet 3  . triamcinolone cream (KENALOG) 0.1 % Apply 1 application topically 2 (two) times daily. 30 g 2    No results found for this or any previous visit (from the past 48 hour(s)). No results found.  ROS: other than HPI, occassional SOB on exertion, cough.  Blood pressure (!) 201/73, pulse 69, temperature 97.8 F (36.6 C), temperature source Oral, resp. rate 18, height 5\' 6"  (1.676  m), weight 47.6 kg, SpO2 100 %. Physical Exam  Gen: alert, nad HEENT: mod left mandibular edema does not pass inferior to the mandibular inferior border, ttp, firm with my erythema. Intraorally, there is active purulence noted in the left mandibular posterior alveolus/edentulous, exposed bone noted, No apparent FOM edema. Oropharynx clear; dry mucous membranes. Hrt: rrr Lungs: distant breath sounds, though no rales/rhonchi Abd: s,nt, nd Neuro: no facial paresthesias.  CBCT scan reviewed showing multiple large bony sequestra in the left posterior mandibular body/anterior ramus. There are surrounding radiolucencies present c/w inflammatory reaction/granuloma. Area appears to be 5x2x2cm; no apparent bone expansion. Area appears superior to IAN canal space.  Assessment/Plan 68 y/o F with left mandibular MRONJ/Osteomyelitis. Plan for OR for left mandibular debridement/sequestrectomy/cultures. R/B/A discussed in detail. She has been instructed to discontinue use of Fosamax until further notice.   Anes request: nasal intubation.  Michael Litter, DMD  OMFS 03/09/2019; 11:43AM

## 2019-03-23 NOTE — Transfer of Care (Signed)
Immediate Anesthesia Transfer of Care Note  Patient: [name]  Procedure(s) Performed: DEBRIDEMENT LEFT MANDIBLE,SEQUESTRECTOMY (Left Head)  Patient Location: PACU  Anesthesia Type:General  Level of Consciousness: awake, alert  and oriented  Airway & Oxygen Therapy: Patient Spontanous Breathing and Patient connected to nasal cannula oxygen  Post-op Assessment: Report given to RN, Post -op Vital signs reviewed and stable and Patient moving all extremities X 4  Post vital signs: Reviewed and stable  Last Vitals:  Vitals Value Taken Time  BP 166/91 03/23/19 0847  Temp    Pulse 94 03/23/19 0851  Resp 17 03/23/19 0851  SpO2 98 % 03/23/19 0851  Vitals shown include unvalidated device data.  Last Pain:  Vitals:   03/23/19 0652  TempSrc:   PainSc: 0-No pain      Patients Stated Pain Goal: 2 (08/65/78 4696)  Complications: No apparent anesthesia complications

## 2019-03-23 NOTE — Brief Op Note (Signed)
03/23/2019  8:34 AM  PATIENT:  [name]  68 y.o. 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   Delay start of Pharmacological VTE agent (>24hrs) due to surgical blood loss or risk of bleeding: not applicable

## 2019-03-23 NOTE — Op Note (Signed)
03/23/2019  8:34 AM  PATIENT:  [name]  68 y.o. 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 Large sequestrum removed from left mandibular body area 2. Inferior alveolar nerve visualized following debridement/intact  Indication for Procedure: 68 y/o F with left mandibular MRONJ requiring debridement.  Procedure: The patient was identified in the preoperative holding by both anesthesia and the maxillofacial team.  Health history was reviewed.  Consent was verified.  The patient was brought back to the operating room placed in the table in supine position.  Standard ASA leads and monitors were placed.  The patient was preoxygenated, induced, and her airway was protected with a nasal endotracheal tube.  The tube was taped and secured by the anesthesia care team.  Throat pack was placed.  The patient was then prepped and draped for standard maxillofacial procedure.  The patient was injected with 8 cc of 1% lidocaine with 1 100,000 epinephrine in the left posterior mandible.  A bite block was placed.  Cultures were obtained in the left posterior mandible for both aerobic and anaerobic testing.  A 15 blade was used to make an incision in the left posterior mandible with a distal fascial release.  Full-thickness flap was elevated on both the facial and lingual aspects.  Gross amount of granulomatous tissue was removed from the superficial aspect and was submitted for specimen.   A large bony sequestrum was also debrided, removed, and submitted for histopathologic examination.  The wound was thoroughly curettaged/debrided.  The inferior alveolar neurovascular bundle was identified and appeared intact.  A large round bur with irrigation was then utilized to smooth adjacent cortical bone on both the facial and lingual aspects.  The wound was then thoroughly irrigated with copious amounts of saline.  Gelfoam was placed.  And the wound was then reapproximated and closed primarily with multiple 3-0 Chromic Gut sutures placed in a continuous interlocking fashion.  The oral cavity was then thoroughly irrigated.  The throat pack was removed.  The patient was injected with 8 cc of quarter percent Marcaine with 1-200,000 epinephrine the left posterior mandible.  The patient was then returned to the anesthesia care team.  She was extubated without event.  She was transported to the postanesthesia care unit for recovery.  She will be discharged home once meeting appropriate criteria.  Maxie Better, DMD Oral & Maxillofacial Surgery

## 2019-03-23 NOTE — Discharge Instructions (Signed)
1. Soft diet until further notice. 2. Take antibiotic and use Oral rinses as prescribed. 3. No smoking.

## 2019-03-24 ENCOUNTER — Encounter (HOSPITAL_COMMUNITY): Payer: Self-pay | Admitting: Oral Surgery

## 2019-03-28 LAB — AEROBIC/ANAEROBIC CULTURE W GRAM STAIN (SURGICAL/DEEP WOUND): Gram Stain: NONE SEEN

## 2019-06-03 ENCOUNTER — Other Ambulatory Visit: Payer: Self-pay | Admitting: Family Medicine

## 2019-06-03 DIAGNOSIS — M810 Age-related osteoporosis without current pathological fracture: Secondary | ICD-10-CM

## 2019-06-04 ENCOUNTER — Encounter: Payer: BLUE CROSS/BLUE SHIELD | Admitting: Family Medicine

## 2019-06-15 ENCOUNTER — Other Ambulatory Visit: Payer: Self-pay | Admitting: Family Medicine

## 2019-06-15 DIAGNOSIS — M810 Age-related osteoporosis without current pathological fracture: Secondary | ICD-10-CM

## 2019-06-15 NOTE — Telephone Encounter (Signed)
Last refilled: 01/22/2019 Last office visit: 06/22/2018

## 2019-07-16 ENCOUNTER — Other Ambulatory Visit: Payer: Self-pay | Admitting: Family Medicine

## 2019-07-16 DIAGNOSIS — M810 Age-related osteoporosis without current pathological fracture: Secondary | ICD-10-CM

## 2019-08-07 ENCOUNTER — Other Ambulatory Visit: Payer: Self-pay | Admitting: Family Medicine

## 2019-08-07 DIAGNOSIS — M810 Age-related osteoporosis without current pathological fracture: Secondary | ICD-10-CM

## 2019-08-31 ENCOUNTER — Encounter: Payer: BLUE CROSS/BLUE SHIELD | Admitting: Family Medicine

## 2019-09-01 ENCOUNTER — Other Ambulatory Visit: Payer: Self-pay | Admitting: Family Medicine

## 2019-09-01 DIAGNOSIS — M810 Age-related osteoporosis without current pathological fracture: Secondary | ICD-10-CM

## 2019-10-02 ENCOUNTER — Other Ambulatory Visit: Payer: Self-pay | Admitting: Family Medicine

## 2019-10-02 DIAGNOSIS — M810 Age-related osteoporosis without current pathological fracture: Secondary | ICD-10-CM

## 2019-10-09 ENCOUNTER — Other Ambulatory Visit: Payer: Self-pay | Admitting: Cardiology

## 2019-10-14 ENCOUNTER — Other Ambulatory Visit: Payer: Self-pay | Admitting: Family Medicine

## 2019-10-14 DIAGNOSIS — M810 Age-related osteoporosis without current pathological fracture: Secondary | ICD-10-CM

## 2019-10-28 ENCOUNTER — Ambulatory Visit (INDEPENDENT_AMBULATORY_CARE_PROVIDER_SITE_OTHER): Payer: BC Managed Care – PPO | Admitting: Family Medicine

## 2019-10-28 ENCOUNTER — Other Ambulatory Visit: Payer: Self-pay

## 2019-10-28 ENCOUNTER — Encounter: Payer: Self-pay | Admitting: Family Medicine

## 2019-10-28 VITALS — BP 132/74 | HR 86 | Temp 98.2°F | Resp 14 | Ht 66.0 in | Wt 101.8 lb

## 2019-10-28 DIAGNOSIS — J439 Emphysema, unspecified: Secondary | ICD-10-CM | POA: Diagnosis not present

## 2019-10-28 DIAGNOSIS — E559 Vitamin D deficiency, unspecified: Secondary | ICD-10-CM

## 2019-10-28 DIAGNOSIS — I739 Peripheral vascular disease, unspecified: Secondary | ICD-10-CM

## 2019-10-28 DIAGNOSIS — E785 Hyperlipidemia, unspecified: Secondary | ICD-10-CM | POA: Diagnosis not present

## 2019-10-28 DIAGNOSIS — F17200 Nicotine dependence, unspecified, uncomplicated: Secondary | ICD-10-CM

## 2019-10-28 DIAGNOSIS — Z1231 Encounter for screening mammogram for malignant neoplasm of breast: Secondary | ICD-10-CM

## 2019-10-28 DIAGNOSIS — I2510 Atherosclerotic heart disease of native coronary artery without angina pectoris: Secondary | ICD-10-CM

## 2019-10-28 DIAGNOSIS — I10 Essential (primary) hypertension: Secondary | ICD-10-CM

## 2019-10-28 DIAGNOSIS — Z9861 Coronary angioplasty status: Secondary | ICD-10-CM

## 2019-10-28 DIAGNOSIS — M810 Age-related osteoporosis without current pathological fracture: Secondary | ICD-10-CM

## 2019-10-28 MED ORDER — ALPRAZOLAM 0.25 MG PO TABS: 0.2500 mg | ORAL_TABLET | Freq: Every day | ORAL | 1 refills | Status: DC

## 2019-10-28 MED ORDER — LISINOPRIL 20 MG PO TABS: 20.0000 mg | ORAL_TABLET | Freq: Every day | ORAL | 1 refills | Status: DC

## 2019-10-28 MED ORDER — OMEPRAZOLE 10 MG PO CPDR: 10.0000 mg | DELAYED_RELEASE_CAPSULE | Freq: Every day | ORAL | 2 refills | Status: DC | PRN

## 2019-10-28 MED ORDER — ALBUTEROL SULFATE HFA 108 (90 BASE) MCG/ACT IN AERS: 1.0000 | INHALATION_SPRAY | Freq: Four times a day (QID) | RESPIRATORY_TRACT | 2 refills | Status: DC | PRN

## 2019-10-28 MED ORDER — CHANTIX STARTING MONTH PAK 0.5 MG X 11 & 1 MG X 42 PO TABS: ORAL_TABLET | ORAL | 0 refills | Status: DC

## 2019-10-28 NOTE — Assessment & Plan Note (Signed)
Controlled.  

## 2019-10-28 NOTE — Progress Notes (Signed)
Subjective:    Patient ID: [name], female    DOB: 1950/11/09, 69 y.o.   MRN: [id number]  Patient presents for Follow-up (is fasting) and Medication Changes (fosamax- oral surgeon stopped d/t bone loss in jaw)   Patient here to follow-up chronic medical problems.  Since the last visit she has had oral surgery secondary to osteonecrosis of the bone after she had infection.  Her Fosamax was discontinued secondary to this diagnosis.  She is due for repeat bone density.  Coronary artery disease peripheral vascular disease she states that she no longer needs to follow-up with cardiology.  She has not had any chest pain shortness of breath leg swelling.  She is taking all her medications as prescribed including her statin drug.   COPD she uses albuterol sparingly would like to have an inhaler on hand.  She is interested in quitting smoking has been on Chantix in the past would like to try this again.  She is due for repeat labs including vitamin D for her deficiency.  No new concerns today.      Review Of Systems:  GEN- denies fatigue, fever, weight loss,weakness, recent illness HEENT- denies eye drainage, change in vision, nasal discharge, CVS- denies chest pain, palpitations RESP- denies SOB, cough, wheeze ABD- denies N/V, change in stools, abd pain GU- denies dysuria, hematuria, dribbling, incontinence MSK- denies joint pain, muscle aches, injury Neuro- denies headache, dizziness, syncope, seizure activity       Objective:    BP 132/74   Pulse 86   Temp 98.2 F (36.8 C) (Temporal)   Resp 14   Ht 5\' 6"  (1.676 m)   Wt 101 lb 12.8 oz (46.2 kg)   LMP  (LMP Unknown)   SpO2 99%   BMI 16.43 kg/m  GEN- NAD, alert and oriented x3 HEENT- PERRL, EOMI, non injected sclera, pink conjunctiva, MMM, oropharynx clear Neck- Supple, no thyromegaly CVS- RRR, no murmur RESP-CTAB ABD-NABS,soft,NT,ND Psych- normal affect and mood EXT- No edema Pulses- Radial, DP- 2+         Assessment & Plan:      Problem List Items Addressed This Visit      Unprioritized   CAD S/P percutaneous coronary angioplasty    No recent chest pain or symptoms On statin drug, recheck lipids, LFT       Relevant Medications   lisinopril (ZESTRIL) 20 MG tablet   COPD (chronic obstructive pulmonary disease) (HCC)    Prn albuterol chantix for tobacco cessation      Relevant Medications   varenicline (CHANTIX STARTING MONTH PAK) 0.5 MG X 11 & 1 MG X 42 tablet   albuterol (VENTOLIN HFA) 108 (90 Base) MCG/ACT inhaler   Dyslipidemia   Relevant Orders   CBC with Differential/Platelet   Lipid panel   Essential hypertension    Controlled       Relevant Medications   lisinopril (ZESTRIL) 20 MG tablet   Osteoporosis    Continue calcium and vitamin D Recheck Bone density No bisphosphonate in setting of osteonecrosis of jaw      Relevant Orders   DG Bone Density   PVD (peripheral vascular disease) (HCC) - Primary   Relevant Medications   lisinopril (ZESTRIL) 20 MG tablet   Other Relevant Orders   CBC with Differential/Platelet   Comprehensive metabolic panel   Lipid panel   Smoker    Start chantix       Vitamin D deficiency   Relevant Orders   Vitamin D, 25-hydroxy  Other Visit Diagnoses    Encounter for screening mammogram for malignant neoplasm of breast       Relevant Orders   MM 3D SCREEN BREAST BILATERAL      Note: This dictation was prepared with Dragon dictation along with smaller phrase technology. Any transcriptional errors that result from this process are unintentional.

## 2019-10-28 NOTE — Assessment & Plan Note (Signed)
Start chantix

## 2019-10-28 NOTE — Assessment & Plan Note (Signed)
Prn albuterol chantix for tobacco cessation

## 2019-10-28 NOTE — Assessment & Plan Note (Signed)
No recent chest pain or symptoms On statin drug, recheck lipids, LFT

## 2019-10-28 NOTE — Assessment & Plan Note (Signed)
Continue calcium and vitamin D Recheck Bone density No bisphosphonate in setting of osteonecrosis of jaw

## 2019-10-28 NOTE — Patient Instructions (Addendum)
Schedule mammogram/ Bone Density  Start chantix for smoking  F/u 3 months for Physical

## 2019-10-29 ENCOUNTER — Other Ambulatory Visit: Payer: Self-pay | Admitting: *Deleted

## 2019-10-29 DIAGNOSIS — E875 Hyperkalemia: Secondary | ICD-10-CM

## 2019-10-29 LAB — COMPREHENSIVE METABOLIC PANEL
AG Ratio: 1.9 (calc) (ref 1.0–2.5)
ALT: 8 U/L (ref 6–29)
AST: 19 U/L (ref 10–35)
Albumin: 4.1 g/dL (ref 3.6–5.1)
Alkaline phosphatase (APISO): 46 U/L (ref 37–153)
BUN/Creatinine Ratio: 22 (calc) (ref 6–22)
BUN: 25 mg/dL (ref 7–25)
CO2: 27 mmol/L (ref 20–32)
Calcium: 10.1 mg/dL (ref 8.6–10.4)
Chloride: 105 mmol/L (ref 98–110)
Creat: 1.14 mg/dL — ABNORMAL HIGH (ref 0.50–0.99)
Globulin: 2.2 g/dL (calc) (ref 1.9–3.7)
Glucose, Bld: 87 mg/dL (ref 65–99)
Potassium: 5.5 mmol/L — ABNORMAL HIGH (ref 3.5–5.3)
Sodium: 140 mmol/L (ref 135–146)
Total Bilirubin: 0.4 mg/dL (ref 0.2–1.2)
Total Protein: 6.3 g/dL (ref 6.1–8.1)

## 2019-10-29 LAB — LIPID PANEL
Cholesterol: 228 mg/dL — ABNORMAL HIGH (ref <200)
HDL: 69 mg/dL (ref >=50)
LDL Cholesterol (Calc): 140 mg/dL (calc) — ABNORMAL HIGH
Non-HDL Cholesterol (Calc): 159 mg/dL (calc) — ABNORMAL HIGH (ref <130)
Total CHOL/HDL Ratio: 3.3 (calc) (ref <5.0)
Triglycerides: 88 mg/dL (ref <150)

## 2019-10-29 LAB — CBC WITH DIFFERENTIAL/PLATELET
Absolute Monocytes: 548 cells/uL (ref 200–950)
Basophils Absolute: 101 cells/uL (ref 0–200)
Basophils Relative: 1.6 %
Eosinophils Absolute: 132 cells/uL (ref 15–500)
Eosinophils Relative: 2.1 %
HCT: 45.7 % — ABNORMAL HIGH (ref 35.0–45.0)
Hemoglobin: 15.7 g/dL — ABNORMAL HIGH (ref 11.7–15.5)
Lymphs Abs: 1701 cells/uL (ref 850–3900)
MCH: 31.5 pg (ref 27.0–33.0)
MCHC: 34.4 g/dL (ref 32.0–36.0)
MCV: 91.8 fL (ref 80.0–100.0)
MPV: 9.5 fL (ref 7.5–12.5)
Monocytes Relative: 8.7 %
Neutro Abs: 3818 cells/uL (ref 1500–7800)
Neutrophils Relative %: 60.6 %
Platelets: 263 10*3/uL (ref 140–400)
RBC: 4.98 10*6/uL (ref 3.80–5.10)
RDW: 13.9 % (ref 11.0–15.0)
Total Lymphocyte: 27 %
WBC: 6.3 10*3/uL (ref 3.8–10.8)

## 2019-10-29 LAB — VITAMIN D 25 HYDROXY (VIT D DEFICIENCY, FRACTURES): Vit D, 25-Hydroxy: 20 ng/mL — ABNORMAL LOW (ref 30–100)

## 2019-10-29 MED ORDER — SODIUM POLYSTYRENE SULFONATE PO POWD: ORAL | 0 refills | Status: DC

## 2019-11-01 ENCOUNTER — Telehealth: Payer: Self-pay | Admitting: Family Medicine

## 2019-11-01 ENCOUNTER — Ambulatory Visit
Admission: RE | Admit: 2019-11-01 | Discharge: 2019-11-01 | Disposition: A | Discharge status: Home / Self Care | Payer: BC Managed Care – PPO | Source: Ambulatory Visit | Attending: Family Medicine | Admitting: Family Medicine

## 2019-11-01 ENCOUNTER — Other Ambulatory Visit: Payer: Medicare Other

## 2019-11-01 ENCOUNTER — Other Ambulatory Visit: Payer: Self-pay

## 2019-11-01 DIAGNOSIS — E875 Hyperkalemia: Secondary | ICD-10-CM

## 2019-11-01 DIAGNOSIS — M810 Age-related osteoporosis without current pathological fracture: Secondary | ICD-10-CM

## 2019-11-01 DIAGNOSIS — Z1231 Encounter for screening mammogram for malignant neoplasm of breast: Secondary | ICD-10-CM

## 2019-11-01 NOTE — Telephone Encounter (Signed)
Patient came in to the office today to have her lab drawn for her repeat BMET. When coming in she requested a letter for her work that explaining that she only has 1 kidney. Patient states that she took off today to repeat lab work and does not wish to return until she knows her labs are fine. She states that she is taking vacation time but she wants some type of documentation showing that she has 1 kidney. Please advise?

## 2019-11-02 LAB — BASIC METABOLIC PANEL
BUN/Creatinine Ratio: 21 (calc) (ref 6–22)
BUN: 23 mg/dL (ref 7–25)
CO2: 27 mmol/L (ref 20–32)
Calcium: 9.5 mg/dL (ref 8.6–10.4)
Chloride: 105 mmol/L (ref 98–110)
Creat: 1.08 mg/dL — ABNORMAL HIGH (ref 0.50–0.99)
Glucose, Bld: 87 mg/dL (ref 65–99)
Potassium: 4.7 mmol/L (ref 3.5–5.3)
Sodium: 142 mmol/L (ref 135–146)

## 2019-11-02 MED ORDER — VITAMIN D (ERGOCALCIFEROL) 1.25 MG (50000 UNIT) PO CAPS: 50000.0000 [IU] | ORAL_CAPSULE | ORAL | 1 refills | Status: DC

## 2019-11-02 NOTE — Telephone Encounter (Signed)
Patient stopped by office and made aware of all lab results/ imaging results.   Work note given for 11/01/2019. 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

## 2019-11-02 NOTE — Telephone Encounter (Signed)
Call placed to patient. LMTRC.  

## 2019-11-02 NOTE — Telephone Encounter (Signed)
Labs look good, kidney function is at baseline and potassium is normal She can have a note for work for yesterday 5/17  coming in for labs But no other days as I did not take her out of work for any reason

## 2019-11-10 ENCOUNTER — Other Ambulatory Visit: Payer: Self-pay | Admitting: Family Medicine

## 2019-11-10 DIAGNOSIS — M810 Age-related osteoporosis without current pathological fracture: Secondary | ICD-10-CM

## 2019-11-24 ENCOUNTER — Other Ambulatory Visit: Payer: Self-pay | Admitting: Family Medicine

## 2019-11-25 MED ORDER — VARENICLINE TARTRATE 1 MG PO TABS: 1.0000 mg | ORAL_TABLET | Freq: Two times a day (BID) | ORAL | 5 refills | Status: DC

## 2020-01-10 ENCOUNTER — Other Ambulatory Visit: Payer: Self-pay | Admitting: Cardiology

## 2020-01-21 ENCOUNTER — Other Ambulatory Visit: Payer: Self-pay | Admitting: Family Medicine

## 2020-02-08 ENCOUNTER — Other Ambulatory Visit: Payer: Self-pay

## 2020-02-08 ENCOUNTER — Encounter: Payer: Self-pay | Admitting: Family Medicine

## 2020-02-08 ENCOUNTER — Ambulatory Visit (INDEPENDENT_AMBULATORY_CARE_PROVIDER_SITE_OTHER): Payer: BC Managed Care – PPO | Admitting: Family Medicine

## 2020-02-08 VITALS — BP 110/68 | HR 82 | Temp 97.8°F | Resp 16 | Ht 66.0 in | Wt 100.4 lb

## 2020-02-08 DIAGNOSIS — I2510 Atherosclerotic heart disease of native coronary artery without angina pectoris: Secondary | ICD-10-CM

## 2020-02-08 DIAGNOSIS — R0989 Other specified symptoms and signs involving the circulatory and respiratory systems: Secondary | ICD-10-CM

## 2020-02-08 DIAGNOSIS — Z0001 Encounter for general adult medical examination with abnormal findings: Secondary | ICD-10-CM | POA: Diagnosis not present

## 2020-02-08 DIAGNOSIS — M79641 Pain in right hand: Secondary | ICD-10-CM

## 2020-02-08 DIAGNOSIS — Z0000 Encounter for general adult medical examination without abnormal findings: Secondary | ICD-10-CM

## 2020-02-08 DIAGNOSIS — Z9861 Coronary angioplasty status: Secondary | ICD-10-CM

## 2020-02-08 MED ORDER — ALPRAZOLAM 0.25 MG PO TABS: 0.2500 mg | ORAL_TABLET | Freq: Every day | ORAL | 1 refills | Status: DC

## 2020-02-08 MED ORDER — EZETIMIBE 10 MG PO TABS: 10.0000 mg | ORAL_TABLET | Freq: Every day | ORAL | 3 refills | Status: DC

## 2020-02-08 NOTE — Patient Instructions (Addendum)
Add ZETIA to your crestor for cholesterol Cologuard for colon cancer screening  [facility]  [address], [place]    We will call for the ultrasound appointment AND xRAY OF Hand the same day  F/U 2 months for blood work- FASTING F/U 6 MONTHS

## 2020-02-08 NOTE — Progress Notes (Signed)
Subjective:   Patient presents for Medicare Annual/Subsequent preventive examination.    Right hand pain, she was opening awater bottle about 5-6 months ago  and noticed her 2nd digit popped at at the DIP, she hit it back in, but still has soreness at the knuckle and it stays swollen, she is able to make a fist  No there concerns   Review Past Medical/Family/Social: per EMR    Risk Factors  Current exercise habits: walks, stays active  Dietary issues discussed: watches cholesterol intake ,salt intake   Cardiac risk factors: known CAD/PVD   Depression Screen  (Note: if answer to either of the following is "Yes", a more complete depression screening is indicated)  Over the past two weeks, have you felt down, depressed or hopeless? No Over the past two weeks, have you felt little interest or pleasure in doing things? No Have you lost interest or pleasure in daily life? No Do you often feel hopeless? No Do you cry easily over simple problems? No   Activities of Daily Living  In your present state of health, do you have any difficulty performing the following activities?:  Driving? No  Managing money? No  Feeding yourself? No  Getting from bed to chair? No  Climbing a flight of stairs? No  Preparing food and eating?: No  Bathing or showering? No  Getting dressed: No  Getting to the toilet? No  Using the toilet:No  Moving around from place to place: No  In the past year have you fallen or had a near fall?:No  Are you sexually active? No  Do you have more than one partner? No   Hearing Difficulties: No  Do you often ask people to speak up or repeat themselves? No  Do you experience ringing or noises in your ears? No Do you have difficulty understanding soft or whispered voices? No  Do you feel that you have a problem with memory? No Do you often misplace items? No  Do you feel safe at home? Yes  Cognitive Testing  Alert? Yes Normal Appearance?Yes  Oriented to person? Yes  Place? Yes  Time? Yes  Recall of three objects? Yes  Can perform simple calculations? Yes  Displays appropriate judgment?Yes  Can read the correct time from a watch face?Yes   List the Names of Other Physician/Practitioners you currently use:  Dentist, Eye Doctor Cardiology    Screening Tests / Date Colonoscopy         Due             Zostavax - can do at pharmacy  PNA-UTD  Mammogram UTD Bone dENSITY UTD  Influenza Vaccine UTD  Tetanus/tdap UTD  COVID-19 UTD   ROS: GEN- denies fatigue, fever, weight loss,weakness, recent illness HEENT- denies eye drainage, change in vision, nasal discharge, CVS- denies chest pain, palpitations RESP- denies SOB, cough, wheeze ABD- denies N/V, change in stools, abd pain GU- denies dysuria, hematuria, dribbling, incontinence MSK- denies joint pain, muscle aches, injury Neuro- denies headache, dizziness, syncope, seizure activity  PHYSICAL: Vitals reviewed  GEN- NAD, alert and oriented x3 HEENT- PERRL, EOMI, non injected sclera, pink conjunctiva, MMM, oropharynx clear Neck- Supple, no thryomegaly , + right carotid bruit  CVS- RRR, no murmur RESP-CTAB ABD-NABS,soft,NT,ND MSK- right hand- swelling at 2ndPIP mild TTP, able to make loose fist  EXT- No edema Pulses- Radial, DP- 2+    Assessment:    Annual wellness medicare exam   Plan:    During the course of the  visit the patient was educated and counseled about appropriate screening and preventive services including:   Colon cancer screening- Cologuard   Right hand injury- obtain xray to evaluate the PIP joint   Carotid bruit- ultrasound to be done  CAD she never received the zetia will start this ina ddition to her Crestor return in 2 months for fasting labs Hep C screening to be done with next set of labs  Vitamin D def in setting of Osteoporosis- now on daily replacement, recheck with labs  Shingles vaccine can be done at pharmacy   FALL/DEPRESSION/AUDIT C screen  negative   F/U 6 months in office        Diet review for nutrition referral? Yes ____ Not Indicated __x__  Patient Instructions (the written plan) was given to the patient.  Medicare Attestation  I have personally reviewed:  The patient's medical and social history  Their use of alcohol, tobacco or illicit drugs  Their current medications and supplements  The patient's functional ability including ADLs,fall risks, home safety risks, cognitive, and hearing and visual impairment  Diet and physical activities  Evidence for depression or mood disorders  The patient's weight, height, BMI, and visual acuity have been recorded in the chart. I have made referrals, counseling, and provided education to the patient based on review of the above and I have provided the patient with a written personalized care plan for preventive services.

## 2020-02-09 ENCOUNTER — Encounter: Payer: Self-pay | Admitting: Family Medicine

## 2020-02-10 ENCOUNTER — Telehealth: Payer: Self-pay | Admitting: *Deleted

## 2020-02-10 ENCOUNTER — Ambulatory Visit
Admission: RE | Admit: 2020-02-10 | Discharge: 2020-02-10 | Disposition: A | Discharge status: Home / Self Care | Payer: BC Managed Care – PPO | Source: Ambulatory Visit | Attending: Family Medicine | Admitting: Family Medicine

## 2020-02-10 DIAGNOSIS — M79641 Pain in right hand: Secondary | ICD-10-CM

## 2020-02-10 NOTE — Telephone Encounter (Signed)
Received verbal orders for Cologuard.   Order placed via [employer].   Cologuard (Order [id number])

## 2020-02-10 NOTE — Telephone Encounter (Signed)
-----   Message from Salley Scarlet, MD sent at 02/09/2020  7:58 AM EDT ----- Regarding: Send Cologuard

## 2020-02-14 ENCOUNTER — Inpatient Hospital Stay: Admission: RE | Admit: 2020-02-14 | Payer: BC Managed Care – PPO | Source: Ambulatory Visit

## 2020-02-22 ENCOUNTER — Ambulatory Visit
Admission: RE | Admit: 2020-02-22 | Discharge: 2020-02-22 | Disposition: A | Discharge status: Home / Self Care | Payer: BC Managed Care – PPO | Source: Ambulatory Visit | Attending: Family Medicine | Admitting: Family Medicine

## 2020-02-22 ENCOUNTER — Encounter: Payer: Self-pay | Admitting: *Deleted

## 2020-02-22 DIAGNOSIS — R0989 Other specified symptoms and signs involving the circulatory and respiratory systems: Secondary | ICD-10-CM

## 2020-02-25 ENCOUNTER — Other Ambulatory Visit: Payer: Self-pay | Admitting: *Deleted

## 2020-02-25 ENCOUNTER — Telehealth: Payer: Self-pay | Admitting: Family Medicine

## 2020-02-25 ENCOUNTER — Telehealth: Payer: Self-pay | Admitting: Cardiology

## 2020-02-25 DIAGNOSIS — I771 Stricture of artery: Secondary | ICD-10-CM

## 2020-02-25 NOTE — Telephone Encounter (Signed)
Cb# [phone] Lab results

## 2020-02-25 NOTE — Telephone Encounter (Signed)
Called and spoke with pt, re-reviewed carotid results with pt. Pt states she would like Dr.Hochrein's opinion on the carotid US and his opinion on whether or not anything needs to be done. Will route to MD

## 2020-02-25 NOTE — Telephone Encounter (Signed)
Patient is requesting to speak with Dr. Jenene Slicker nurse to discuss carotid completed on 03/13/20 with Dr. Deirdre Peer office. Please call.

## 2020-02-27 NOTE — Telephone Encounter (Signed)
I was asked to review results ordered by Salley Scarlet, MD.  There appears to be mild carotid plaque.  No further imaging needs to be done at this time.  I would suggest that we schedule follow up in one year with a repeat Doppler in my office in one year.  Call [name] with this suggestion.

## 2020-02-28 ENCOUNTER — Ambulatory Visit: Payer: Self-pay | Admitting: Family Medicine

## 2020-02-28 NOTE — Telephone Encounter (Signed)
Left message to call back  

## 2020-03-03 ENCOUNTER — Telehealth: Payer: Self-pay | Admitting: Family Medicine

## 2020-03-03 NOTE — Telephone Encounter (Signed)
CB# [phone] Lab results

## 2020-03-27 NOTE — Telephone Encounter (Signed)
Spoke with pt, aware of dr hochrein's recommendations. 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

## 2020-03-27 NOTE — Telephone Encounter (Signed)
° °  Pt returning call, she said to call her at her work phone at 1 pm - 9 pm she'll be there

## 2020-03-27 NOTE — Telephone Encounter (Signed)
Left message for patient to call back  

## 2020-04-08 ENCOUNTER — Other Ambulatory Visit: Payer: Self-pay | Admitting: Cardiology

## 2020-04-09 ENCOUNTER — Other Ambulatory Visit: Payer: Self-pay | Admitting: Cardiology

## 2020-04-10 ENCOUNTER — Other Ambulatory Visit: Payer: Self-pay

## 2020-04-10 ENCOUNTER — Encounter: Payer: Self-pay | Admitting: Family Medicine

## 2020-04-10 ENCOUNTER — Ambulatory Visit (INDEPENDENT_AMBULATORY_CARE_PROVIDER_SITE_OTHER): Payer: BC Managed Care – PPO | Admitting: Family Medicine

## 2020-04-10 VITALS — BP 132/90 | HR 74 | Temp 98.1°F | Ht 66.0 in | Wt 101.0 lb

## 2020-04-10 DIAGNOSIS — E785 Hyperlipidemia, unspecified: Secondary | ICD-10-CM | POA: Diagnosis not present

## 2020-04-10 DIAGNOSIS — Z23 Encounter for immunization: Secondary | ICD-10-CM

## 2020-04-10 DIAGNOSIS — F17200 Nicotine dependence, unspecified, uncomplicated: Secondary | ICD-10-CM

## 2020-04-10 DIAGNOSIS — I2510 Atherosclerotic heart disease of native coronary artery without angina pectoris: Secondary | ICD-10-CM

## 2020-04-10 DIAGNOSIS — E559 Vitamin D deficiency, unspecified: Secondary | ICD-10-CM

## 2020-04-10 DIAGNOSIS — Z9861 Coronary angioplasty status: Secondary | ICD-10-CM

## 2020-04-10 DIAGNOSIS — I10 Essential (primary) hypertension: Secondary | ICD-10-CM | POA: Diagnosis not present

## 2020-04-10 DIAGNOSIS — E875 Hyperkalemia: Secondary | ICD-10-CM

## 2020-04-10 NOTE — Assessment & Plan Note (Signed)
Mild elevation in diastolic  Has been controlled No change to meds Fasting labs obtained

## 2020-04-10 NOTE — Progress Notes (Signed)
   Subjective:    Patient ID: [name], female    DOB: 1951-03-18, 69 y.o.   MRN: [id number]  Patient presents for Follow-up (x80mos)   Pt here for f/u, she was seen in August, she was due to blood work today for her cholesterol Zetia was added to her crestor as recommended by cardiology  She is taking without any difficulty  She US done of carotid arteries she has stenosis of left subclavian, so she was referred to vascular   No new concerns today    Review Of Systems:  GEN- denies fatigue, fever, weight loss,weakness, recent illness HEENT- denies eye drainage, change in vision, nasal discharge, CVS- denies chest pain, palpitations RESP- denies SOB, cough, wheeze ABD- denies N/V, change in stools, abd pain GU- denies dysuria, hematuria, dribbling, incontinence MSK- denies joint pain, muscle aches, injury Neuro- denies headache, dizziness, syncope, seizure activity       Objective:    BP 132/90 (BP Location: Right Arm, Patient Position: Sitting, Cuff Size: Normal)   Pulse 74   Temp 98.1 F (36.7 C) (Oral)   Ht 5\' 6"  (1.676 m)   Wt 101 lb (45.8 kg)   LMP  (LMP Unknown)   SpO2 94%   BMI 16.30 kg/m  GEN- NAD, alert and oriented x3 HEENT- PERRL, EOMI, non injected sclera, pink conjunctiva, MMM, oropharynx clear Neck- Supple, no thyromegaly, right bruit CVS- RRR, no murmur RESP-CTAB EXT- No edema Pulses- Radial, DP- 2+        Assessment & Plan:      Problem List Items Addressed This Visit      Unprioritized   Dyslipidemia - Primary   Relevant Orders   CBC with Differential/Platelet   Comprehensive metabolic panel   Lipid panel   Essential hypertension    Mild elevation in diastolic  Has been controlled No change to meds Fasting labs obtained       Relevant Orders   CBC with Differential/Platelet   Comprehensive metabolic panel   TSH   Smoker    chantix recalled, she continues to cut back on her own      Vitamin D deficiency    Recheck  vitamin D She can take bisphosphante due to ON of jaw      Relevant Orders   Vitamin D, 25-hydroxy    Other Visit Diagnoses    Need for immunization against influenza       Relevant Orders   Flu Vaccine QUAD High Dose(Fluad) (Completed)      Note: This dictation was prepared with Dragon dictation along with smaller phrase technology. Any transcriptional errors that result from this process are unintentional.

## 2020-04-10 NOTE — Assessment & Plan Note (Signed)
Recheck vitamin D She can take bisphosphante due to ON of jaw

## 2020-04-10 NOTE — Assessment & Plan Note (Signed)
chantix recalled, she continues to cut back on her own

## 2020-04-11 LAB — COMPREHENSIVE METABOLIC PANEL
AG Ratio: 2 (calc) (ref 1.0–2.5)
ALT: 20 U/L (ref 6–29)
AST: 28 U/L (ref 10–35)
Albumin: 4.3 g/dL (ref 3.6–5.1)
Alkaline phosphatase (APISO): 46 U/L (ref 37–153)
BUN/Creatinine Ratio: 24 (calc) — ABNORMAL HIGH (ref 6–22)
BUN: 25 mg/dL (ref 7–25)
CO2: 25 mmol/L (ref 20–32)
Calcium: 10.5 mg/dL — ABNORMAL HIGH (ref 8.6–10.4)
Chloride: 106 mmol/L (ref 98–110)
Creat: 1.04 mg/dL — ABNORMAL HIGH (ref 0.50–0.99)
Globulin: 2.1 g/dL (calc) (ref 1.9–3.7)
Glucose, Bld: 88 mg/dL (ref 65–99)
Potassium: 5.5 mmol/L — ABNORMAL HIGH (ref 3.5–5.3)
Sodium: 139 mmol/L (ref 135–146)
Total Bilirubin: 0.4 mg/dL (ref 0.2–1.2)
Total Protein: 6.4 g/dL (ref 6.1–8.1)

## 2020-04-11 LAB — CBC WITH DIFFERENTIAL/PLATELET
Absolute Monocytes: 600 cells/uL (ref 200–950)
Basophils Absolute: 68 cells/uL (ref 0–200)
Basophils Relative: 0.9 %
Eosinophils Absolute: 84 cells/uL (ref 15–500)
Eosinophils Relative: 1.1 %
HCT: 43.2 % (ref 35.0–45.0)
Hemoglobin: 14.9 g/dL (ref 11.7–15.5)
Lymphs Abs: 1809 cells/uL (ref 850–3900)
MCH: 31.9 pg (ref 27.0–33.0)
MCHC: 34.5 g/dL (ref 32.0–36.0)
MCV: 92.5 fL (ref 80.0–100.0)
MPV: 9.4 fL (ref 7.5–12.5)
Monocytes Relative: 7.9 %
Neutro Abs: 5039 cells/uL (ref 1500–7800)
Neutrophils Relative %: 66.3 %
Platelets: 231 10*3/uL (ref 140–400)
RBC: 4.67 10*6/uL (ref 3.80–5.10)
RDW: 13.3 % (ref 11.0–15.0)
Total Lymphocyte: 23.8 %
WBC: 7.6 10*3/uL (ref 3.8–10.8)

## 2020-04-11 LAB — LIPID PANEL
Cholesterol: 191 mg/dL (ref <200)
HDL: 73 mg/dL (ref >=50)
LDL Cholesterol (Calc): 103 mg/dL (calc) — ABNORMAL HIGH
Non-HDL Cholesterol (Calc): 118 mg/dL (calc) (ref <130)
Total CHOL/HDL Ratio: 2.6 (calc) (ref <5.0)
Triglycerides: 68 mg/dL (ref <150)

## 2020-04-11 LAB — TSH: TSH: 0.72 mIU/L (ref 0.40–4.50)

## 2020-04-11 LAB — VITAMIN D 25 HYDROXY (VIT D DEFICIENCY, FRACTURES): Vit D, 25-Hydroxy: 87 ng/mL (ref 30–100)

## 2020-04-11 NOTE — Addendum Note (Signed)
Addended by: Milinda Antis F on: 04/11/2020 04:51 PM   Modules accepted: Orders

## 2020-04-14 ENCOUNTER — Other Ambulatory Visit: Payer: Self-pay

## 2020-04-14 ENCOUNTER — Other Ambulatory Visit: Payer: Medicare Other

## 2020-04-14 DIAGNOSIS — E875 Hyperkalemia: Secondary | ICD-10-CM

## 2020-04-15 LAB — BASIC METABOLIC PANEL
BUN/Creatinine Ratio: 23 (calc) — ABNORMAL HIGH (ref 6–22)
BUN: 23 mg/dL (ref 7–25)
CO2: 25 mmol/L (ref 20–32)
Calcium: 10.3 mg/dL (ref 8.6–10.4)
Chloride: 109 mmol/L (ref 98–110)
Creat: 1.01 mg/dL — ABNORMAL HIGH (ref 0.50–0.99)
Glucose, Bld: 90 mg/dL (ref 65–99)
Potassium: 4.8 mmol/L (ref 3.5–5.3)
Sodium: 141 mmol/L (ref 135–146)

## 2020-04-18 ENCOUNTER — Encounter: Payer: Self-pay | Admitting: Vascular Surgery

## 2020-04-18 ENCOUNTER — Ambulatory Visit: Payer: BC Managed Care – PPO | Admitting: Vascular Surgery

## 2020-04-18 ENCOUNTER — Other Ambulatory Visit: Payer: Self-pay

## 2020-04-18 DIAGNOSIS — I708 Atherosclerosis of other arteries: Secondary | ICD-10-CM | POA: Diagnosis not present

## 2020-04-18 NOTE — Progress Notes (Signed)
Patient name: [name] MRN: [id number] DOB: 1950/09/26 Sex: female  REASON FOR CONSULT: Referred for evaluation of left subclavian artery stenosis  HPI: [name] is a 69 y.o. 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  Patient states her PCP heard a bruit recently that prompted a carotid artery ultrasound.  She denies any history of strokes or TIAs except for one about 20 years ago.  Turns out she has had a left carotid subclavian artery bypass in 2008 by Dr. Arbie Cookey with a 7 mm Hemashield sewn from the left common carotid to the left subclavian.  She states prior to the surgery she could really not use her left arm and now her arm is working fine.  She has some mild dizziness at baseline.  No other chest pain.  Still smoking half pack a day.  Past Medical History:  Diagnosis Date  . Allergy   . COPD (chronic obstructive pulmonary disease) (HCC)   . Coronary artery disease 05/16/1995   Cathed in November, 05/20/1995--CABG x 1-----LIMA to LAD, 11/18 STEMI PCI/DES pLAD, EF 45-50%  . GERD (gastroesophageal reflux disease)   . History of kidney stones    surgery left kidney removed  . Hyperlipidemia   . Hypertension   . Myocardial infarction (HCC)   . Nephrolithiasis    Left Nephrectomy   . 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 Peripheral vascular disease (HCC)    carotid to subclavian artery bypass graft  . Smoker     Past Surgical History:  Procedure Laterality Date  . CAROTID ARTERY - SUBCLAVIAN ARTERY BYPASS GRAFT Left 04/18/2007  . COLONOSCOPY  2008  . CORONARY ARTERY BYPASS GRAFT    . CORONARY/GRAFT ACUTE MI REVASCULARIZATION N/A 05/12/2017   Procedure: Coronary/Graft Acute MI Revascularization;  Surgeon: Tonny Bollman, MD;  Location: [facility] INVASIVE CV LAB;  Service: Cardiovascular;  Laterality: N/A;  . DEBRIDEMENT MANDIBLE Left 03/23/2019   Procedure: DEBRIDEMENT LEFT MANDIBLE,SEQUESTRECTOMY;  Surgeon: Vivia Ewing, DMD;  Location: [facility] OR;  Service: Oral Surgery;  Laterality: Left;  . KIDNEY SURGERY Left    69 yo  . LEFT HEART CATH AND CORONARY ANGIOGRAPHY N/A 05/12/2017   Procedure: LEFT HEART CATH AND CORONARY ANGIOGRAPHY;  Surgeon: Tonny Bollman, MD;  Location: [facility] INVASIVE CV LAB;  Service: Cardiovascular;  Laterality: N/A;  . Left Nephrectomy Left 10/17/1983   Dr. Sural---StagHornCalculus, Left Kidney Nonfunctioning wiht Acute and Chronic Pyelonephritis  . WISDOM TOOTH EXTRACTION      Family History  Problem Relation Age of Onset  . Depression Sister   . COPD Father   . Cancer Sister        hysterectomy sec to cancer  . Cancer Sister        cervical cancer  . COPD Brother     SOCIAL HISTORY: Social History   Socioeconomic History  . Marital status: Married    Spouse name: Not on file  . Number of children: 2  . Years of education: Not on file  . Highest education level: Not on file  Occupational History  . Not on file  Tobacco Use  . Smoking status: Current Every Day Smoker    Packs/day: 0.25    Years: 40.00    Pack years: 10.00    Types: Cigarettes  . Smokeless tobacco: Never  Used  Vaping Use  . Vaping Use: Never used  Substance and Sexual Activity  . Alcohol use: Yes    Comment: Occasional Beer  . Drug use: No  . Sexual activity: Yes    Birth control/protection: Post-menopausal  Other Topics Concern  . Not on file  Social History Narrative   Lives alone.     Social Determinants of Health   Financial Resource Strain:   . Difficulty of Paying Living Expenses: Not on file  Food Insecurity:   . Worried About Programme researcher, broadcasting/film/video in the Last Year: Not on file  . Ran Out of Food in the Last Year: Not on file  Transportation Needs:   . Lack of Transportation  (Medical): Not on file  . Lack of Transportation (Non-Medical): Not on file  Physical Activity:   . Days of Exercise per Week: Not on file  . Minutes of Exercise per Session: Not on file  Stress:   . Feeling of Stress : Not on file  Social Connections:   . Frequency of Communication with Friends and Family: Not on file  . Frequency of Social Gatherings with Friends and Family: Not on file  . Attends Religious Services: Not on file  . Active Member of Clubs or Organizations: Not on file  . Attends Banker Meetings: Not on file  . Marital Status: Not on file  Intimate Partner Violence:   . Fear of Current or Ex-Partner: Not on file  . Emotionally Abused: Not on file  . Physically Abused: Not on file  . Sexually Abused: Not on file    Allergies  Allergen Reactions  . Bisphosphonates Other (See Comments)    Osteonecrosis Jaw     Current Outpatient Medications  Medication Sig Dispense Refill  . albuterol (VENTOLIN HFA) 108 (90 Base) MCG/ACT inhaler Inhale 1-2 puffs into the lungs every 6 (six) hours as needed for wheezing or shortness of breath. 18 g 2  . ALPRAZolam (XANAX) 0.25 MG tablet Take 1 tablet (0.25 mg total) by mouth daily. 30 tablet 1  . amLODipine (NORVASC) 10 MG tablet TAKE 1 TABLET BY MOUTH EVERY DAY (Patient taking differently: Take 10 mg by mouth daily. ) 90 tablet 3  . aspirin 81 MG tablet Take 81 mg by mouth daily.    . carvedilol (COREG) 3.125 MG tablet TAKE 1 TABLET (3.125 MG TOTAL) BY MOUTH 2 (TWO) TIMES DAILY WITH A MEAL. 60 tablet 1  . ezetimibe (ZETIA) 10 MG tablet Take 1 tablet (10 mg total) by mouth daily. For cholesterol 30 tablet 3  . lisinopril (ZESTRIL) 20 MG tablet Take 1 tablet (20 mg total) by mouth daily. 90 tablet 1  . nitroGLYCERIN (NITROSTAT) 0.4 MG SL tablet PLACE 1 TABLET ON THE TONGUE &LET DISSOLVE EVERY 5 MINS AS NEEDED FOR CHEST PAIN (Patient taking differently: Place 0.4 mg under the tongue every 5 (five) minutes as needed for  chest pain. ) 25 tablet 1  . omeprazole (PRILOSEC) 10 MG capsule TAKE 1 CAPSULE (10 MG TOTAL) BY MOUTH DAILY AS NEEDED. 90 capsule 1  . rosuvastatin (CRESTOR) 40 MG tablet TAKE 1 TABLET BY MOUTH EVERY DAY 90 tablet 1  . triamcinolone cream (KENALOG) 0.1 % Apply 1 application topically 2 (two) times daily. 30 g 2  . vitamin B-12 (CYANOCOBALAMIN) 100 MCG tablet Take 100 mcg by mouth daily.    . vitamin E 100 UNIT capsule Take 100 Units by mouth daily.      No current  facility-administered medications for this visit.    REVIEW OF SYSTEMS:  [X]  denotes positive finding, [ ]  denotes negative finding Cardiac  Comments:  Chest pain or chest pressure:    Shortness of breath upon exertion:    Short of breath when lying flat:    Irregular heart rhythm:        Vascular    Pain in calf, thigh, or hip brought on by ambulation:    Pain in feet at night that wakes you up from your sleep:     Blood clot in your veins:    Leg swelling:         Pulmonary    Oxygen at home:    Productive cough:     Wheezing:         Neurologic    Sudden weakness in arms or legs:     Sudden numbness in arms or legs:     Sudden onset of difficulty speaking or slurred speech:    Temporary loss of vision in one eye:     Problems with dizziness:         Gastrointestinal    Blood in stool:     Vomited blood:         Genitourinary    Burning when urinating:     Blood in urine:        Psychiatric    Major depression:         Hematologic    Bleeding problems:    Problems with blood clotting too easily:        Skin    Rashes or ulcers:        Constitutional    Fever or chills:      PHYSICAL EXAM: Vitals:   04/18/20 0939 04/18/20 0941  BP: (!) 156/93 (!) 202/81  Pulse: 71   Resp: 20   Temp: 98 F (36.7 C)   SpO2: 95%   Weight: 101 lb (45.8 kg)   Height: 5\' 6"  (1.676 m)     GENERAL: The patient is a well-nourished female, in no acute distress. The vital signs are documented above. CARDIAC:  There is a regular rate and rhythm.  VASCULAR:  Palpable radial brachial pulses bilateral upper extremities Palpable pulse in left common carotid to subclavian artery bypass with well healed incision PULMONARY: There is good air exchange bilaterally without wheezing or rales. ABDOMEN: Soft and non-tender. MUSCULOSKELETAL: There are no major deformities or cyanosis. NEUROLOGIC: No focal weakness or paresthesias are detected. SKIN: There are no ulcers or rashes noted. PSYCHIATRIC: The patient has a normal affect.  DATA:   Carotid duplex showed reverse flow in the left vertebral artery with no hemodynamically significant carotid bifurcation disease.  Assessment/Plan:  69 year old female referred for evaluation of suspected left subclavian artery stenosis following a duplex to evaluate for carotid artery disease.  Patient actually has a known left subclavian artery occlusion and has already had a left common carotid to subclavian artery bypass in 2008 by Dr. .  She has a palpable pulse in her bypass and a palpable left radial and brachial pulse.  I do not think she is having any signs of posterior insufficiency and this should have already been treated with a patent bypass as noted above.  She has no significant carotid bifurcation disease on duplex either that would warrant intervention from a carotid standpoint.  That being said, I am going to recommend a left upper extremity arterial duplex with repeat of her carotid duplex to  evaluate her carotid subclavian bypass given this was done in 2008 and she has had no follow-up.  I will have her see me after the imaging is complete.   Cephus Shellinghristopher J. Chessa Barrasso, MD [facility] [place] Office: [phone]

## 2020-04-19 ENCOUNTER — Other Ambulatory Visit: Payer: Self-pay

## 2020-04-19 DIAGNOSIS — I708 Atherosclerosis of other arteries: Secondary | ICD-10-CM

## 2020-04-22 ENCOUNTER — Other Ambulatory Visit: Payer: Self-pay | Admitting: Family Medicine

## 2020-04-28 ENCOUNTER — Other Ambulatory Visit: Payer: Self-pay | Admitting: Family Medicine

## 2020-05-04 ENCOUNTER — Other Ambulatory Visit: Payer: Self-pay | Admitting: Cardiology

## 2020-05-05 ENCOUNTER — Other Ambulatory Visit: Payer: Self-pay | Admitting: Family Medicine

## 2020-05-30 ENCOUNTER — Other Ambulatory Visit: Payer: Self-pay

## 2020-05-30 ENCOUNTER — Ambulatory Visit (HOSPITAL_COMMUNITY)
Admission: RE | Admit: 2020-05-30 | Discharge: 2020-05-30 | Disposition: A | Discharge status: Home / Self Care | Payer: BC Managed Care – PPO | Source: Ambulatory Visit | Attending: Vascular Surgery | Admitting: Vascular Surgery

## 2020-05-30 ENCOUNTER — Encounter: Payer: Self-pay | Admitting: Vascular Surgery

## 2020-05-30 ENCOUNTER — Ambulatory Visit: Payer: BC Managed Care – PPO | Admitting: Vascular Surgery

## 2020-05-30 ENCOUNTER — Ambulatory Visit (INDEPENDENT_AMBULATORY_CARE_PROVIDER_SITE_OTHER)
Admission: RE | Admit: 2020-05-30 | Discharge: 2020-05-30 | Disposition: A | Discharge status: Home / Self Care | Payer: BC Managed Care – PPO | Source: Ambulatory Visit | Attending: Vascular Surgery | Admitting: Vascular Surgery

## 2020-05-30 VITALS — BP 195/85 | HR 75 | Temp 98.2°F | Resp 14 | Ht 66.0 in | Wt 105.0 lb

## 2020-05-30 DIAGNOSIS — I708 Atherosclerosis of other arteries: Secondary | ICD-10-CM | POA: Insufficient documentation

## 2020-05-30 DIAGNOSIS — I771 Stricture of artery: Secondary | ICD-10-CM | POA: Diagnosis not present

## 2020-05-30 NOTE — Progress Notes (Signed)
Patient name: [name] MRN: [id number] DOB: 07/06/1950 Sex: female  REASON FOR CONSULT: F/U after left upper extremity arterial duplex and carotid duplex  HPI: [name] is a 69 y.o. female, with multiple medical problems including tobacco abuse, COPD, coronary artery disease status post CABG, known left subclavian artery occlusion with left carotid subclavian bypass, hypertension, hyperlipidemia that presents for further follow-up after carotid duplex and left upper extremity arterial duplex.  She was initially referred by her PCP for evaluation of left subclavian stenosis.    On further evaluation, we discovered she had a previous left carotid subclavian bypass by Dr. Arbie Cookey years ago with a 7 mm Hemashield.  She states that at the time of surgery she had pretty found weakness in the left arm.  Her arm is working fine now.  In addition she does not really endorse any ongoing dizziness to suggest posterior circulation issues.  We did plan duplex studies for surveillance of her bypass given she has not been seen in years.  Past Medical History:  Diagnosis Date  . Allergy   . COPD (chronic obstructive pulmonary disease) (HCC)   . Coronary artery disease 05/16/1995   Cathed in November, 05/20/1995--CABG x 1-----LIMA to LAD, 11/18 STEMI PCI/DES pLAD, EF 45-50%  . GERD (gastroesophageal reflux disease)   . History of kidney stones    surgery left kidney removed  . Hyperlipidemia   . Hypertension   . Myocardial infarction (HCC)   . Nephrolithiasis    Left Nephrectomy   . 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 Peripheral vascular disease (HCC)    carotid to subclavian artery bypass graft  . Smoker     Past Surgical History:  Procedure Laterality Date  . CAROTID ARTERY - SUBCLAVIAN ARTERY BYPASS GRAFT Left 04/18/2007  . COLONOSCOPY  2008  . CORONARY ARTERY BYPASS GRAFT    . CORONARY/GRAFT ACUTE MI REVASCULARIZATION N/A 05/12/2017   Procedure: Coronary/Graft Acute MI  Revascularization;  Surgeon: Tonny Bollman, MD;  Location: [facility] INVASIVE CV LAB;  Service: Cardiovascular;  Laterality: N/A;  . DEBRIDEMENT MANDIBLE Left 03/23/2019   Procedure: DEBRIDEMENT LEFT MANDIBLE,SEQUESTRECTOMY;  Surgeon: Vivia Ewing, DMD;  Location: [facility] OR;  Service: Oral Surgery;  Laterality: Left;  . KIDNEY SURGERY Left    69 yo  . LEFT HEART CATH AND CORONARY ANGIOGRAPHY N/A 05/12/2017   Procedure: LEFT HEART CATH AND CORONARY ANGIOGRAPHY;  Surgeon: Tonny Bollman, MD;  Location: [facility] INVAS[facility] CV LAB;  Service: Cardiovascular;  Laterality: N/A;  . Left Nephrectomy Left 10/17/1983   Dr. Sural---StagHornCalculus, Left Kidney Nonfunctioning wiht Acute and Chronic Pyelonephritis  . WISDOM TOOTH EXTRACTION      Family History  Problem Relation Age of Onset  . Depression Sister   . COPD Father   . Cancer Sister        hysterectomy sec to cancer  . Cancer Sister        cervical cancer  . COPD Brother     SOCIAL HISTORY: Social History   Socioeconomic History  . Marital status: Married    Spouse name: Not on file  . Number of children: 2  . Years of education: Not on file  . Highest education level: Not on file  Occupational History  . Not on file  Tobacco Use  . Smoking status: Current Every Day Smoker    Packs/day: 0.25    Years: 40.00    Pack years: 10.00    Types: Cigarettes  . Smokeless tobacco: Never Used  Vaping Use  . Vaping Use: Never used  Substance and Sexual Activity  . Alcohol use: Yes    Comment: Occasional Beer  . Drug use: No  . Sexual activity: Yes    Birth control/protection: Post-menopausal  Other Topics Concern  . Not on file  Social History Narrative   Lives alone.     Social Determinants of Health   Financial Resource Strain: Not on file  Food Insecurity: Not on file  Transportation Needs: Not on file  Physical Activity: Not on file  Stress: Not on file  Social Connections: Not on file  Intimate Partner Violence: Not on file     Allergies  Allergen Reactions  . Bisphosphonates Other (See Comments)    Osteonecrosis Jaw     Current Outpatient Medications  Medication Sig Dispense Refill  . albuterol (VENTOLIN HFA) 108 (90 Base) MCG/ACT inhaler Inhale 1-2 puffs into the lungs every 6 (six) hours as needed for wheezing or shortness of breath. 18 g 2  . ALPRAZolam (XANAX) 0.25 MG tablet Take 1 tablet (0.25 mg total) by mouth daily. 30 tablet 1  . amLODipine (NORVASC) 10 MG tablet TAKE 1 TABLET BY MOUTH EVERY DAY (Patient taking differently: Take 10 mg by mouth daily.) 90 tablet 3  . aspirin 81 MG tablet Take 81 mg by mouth daily.    . carvedilol (COREG) 3.125 MG tablet TAKE 1 TABLET (3.125 MG TOTAL) BY MOUTH 2 (TWO) TIMES DAILY WITH A MEAL. 60 tablet 1  . ezetimibe (ZETIA) 10 MG tablet TAKE 1 TABLET (10 MG TOTAL) BY MOUTH DAILY. FOR CHOLESTEROL 90 tablet 1  . lisinopril (ZESTRIL) 20 MG tablet TAKE 1 TABLET BY MOUTH EVERY DAY 90 tablet 1  . nitroGLYCERIN (NITROSTAT) 0.4 MG SL tablet PLACE 1 TABLET ON THE TONGUE &LET DISSOLVE EVERY 5 MINS AS NEEDED FOR CHEST PAIN (Patient taking differently: Place 0.4 mg under the tongue every 5 (five) minutes as needed for chest pain.) 25 tablet 1  . omeprazole (PRILOSEC) 10 MG capsule TAKE 1 CAPSULE (10 MG TOTAL) BY MOUTH DAILY AS NEEDED. 90 capsule 1  . rosuvastatin (CRESTOR) 40 MG tablet TAKE 1 TABLET BY MOUTH EVERY DAY 90 tablet 1  . triamcinolone cream (KENALOG) 0.1 % Apply 1 application topically 2 (two) times daily. 30 g 2  . vitamin B-12 (CYANOCOBALAMIN) 100 MCG tablet Take 100 mcg by mouth daily.    . vitamin E 100 UNIT capsule Take 100 Units by mouth daily.      No current facility-administered medications for this visit.    REVIEW OF SYSTEMS:  [X]  denotes positive finding, [ ]  denotes negative finding Cardiac  Comments:  Chest pain or chest pressure:    Shortness of breath upon exertion:    Short of breath when lying flat:    Irregular heart rhythm:        Vascular     Pain in calf, thigh, or hip brought on by ambulation:    Pain in feet at night that wakes you up from your sleep:     Blood clot in your veins:    Leg swelling:         Pulmonary    Oxygen at home:    Productive cough:     Wheezing:         Neurologic    Sudden weakness in arms or legs:     Sudden numbness in arms or legs:     Sudden onset of difficulty speaking or slurred speech:  Temporary loss of vision in one eye:     Problems with dizziness:         Gastrointestinal    Blood in stool:     Vomited blood:         Genitourinary    Burning when urinating:     Blood in urine:        Psychiatric    Major depression:         Hematologic    Bleeding problems:    Problems with blood clotting too easily:        Skin    Rashes or ulcers:        Constitutional    Fever or chills:      PHYSICAL EXAM: Vitals:   05/30/20 1314 05/30/20 1316 05/30/20 1320  BP: (!) 158/87 (!) 197/104 (!) 195/85  Pulse: 75 75   Resp: 14    Temp: 98.2 F (36.8 C)    TempSrc: Temporal    SpO2: 98%    Weight: 105 lb (47.6 kg)    Height: 5\' 6"  (1.676 m)      GENERAL: The patient is a well-nourished female, in no acute distress. The vital signs are documented above. CARDIAC: There is a regular rate and rhythm.  VASCULAR:  Palpable radial brachial pulses bilateral upper extremities PULMONARY: There is good air exchange bilaterally without wheezing or rales. ABDOMEN: Soft and non-tender. MUSCULOSKELETAL: There are no major deformities or cyanosis. NEUROLOGIC: No focal weakness or paresthesias are detected. SKIN: There are no ulcers or rashes noted. PSYCHIATRIC: The patient has a normal affect.  DATA:   Carotid duplex today shows no significant bifurcation disease with minimal 1 to 39% stenosis.  Appears her left carotid clavian bypass may be occluded by duplex.  Monophasic runoff in the left arm.  Assessment/Plan:  69 year old female referred for evaluation of suspected left  subclavian artery stenosis following a duplex to evaluate for carotid artery disease.  When we first evaluated her, we discovered she actually had a left subclavian artery occlusion and had a previous left common carotid to subclavian artery bypass in 2008 by Dr. 2009.    Unfortunately the duplex today suggest that her bypass is occluded.  I was somewhat surprised by this given she does have a palpable radial and brachial pulse in the left arm.  She really denies any left arm symptoms like fatigue or claudication and has no posterior circulation issues like dizziness that would warrant evaluation for redo bypass.  Next step would then be CT, but again without symptoms I do not think she needs a  CT at this time. I will see her again in a year with carotid duplex.  She knows to call with questions or concerns   Arbie Cookey, MD [facility] [place] Office: [phone]    [phone]

## 2020-06-21 ENCOUNTER — Telehealth: Payer: Self-pay

## 2020-06-21 NOTE — Telephone Encounter (Signed)
Please make appointment to discuss this

## 2020-06-21 NOTE — Telephone Encounter (Signed)
Patient called needs a referral setup for a kidney and lung dr.  Please contact patient.

## 2020-06-21 NOTE — Telephone Encounter (Signed)
LVM to return call to schedule appt.

## 2020-06-26 ENCOUNTER — Telehealth: Payer: Self-pay | Admitting: Cardiology

## 2020-06-26 NOTE — Telephone Encounter (Signed)
[name] is calling requesting a copy of the notes from her past surgeries with our office. Please advise.

## 2020-06-26 NOTE — Telephone Encounter (Signed)
Ann Held these surgery notes would have to come from [facility] Medical Records Dept. 06/26/20 fsw

## 2020-06-26 NOTE — Telephone Encounter (Signed)
Call placed to patient and patient made aware via VM. 

## 2020-07-25 ENCOUNTER — Ambulatory Visit (INDEPENDENT_AMBULATORY_CARE_PROVIDER_SITE_OTHER): Payer: 59 | Admitting: Nurse Practitioner

## 2020-07-25 ENCOUNTER — Other Ambulatory Visit: Payer: Self-pay

## 2020-07-25 ENCOUNTER — Encounter: Payer: Self-pay | Admitting: Nurse Practitioner

## 2020-07-25 VITALS — BP 154/88 | HR 71 | Temp 97.2°F | Ht 66.0 in | Wt 104.0 lb

## 2020-07-25 DIAGNOSIS — R1904 Left lower quadrant abdominal swelling, mass and lump: Secondary | ICD-10-CM

## 2020-07-25 DIAGNOSIS — R140 Abdominal distension (gaseous): Secondary | ICD-10-CM

## 2020-07-25 DIAGNOSIS — Z905 Acquired absence of kidney: Secondary | ICD-10-CM | POA: Diagnosis not present

## 2020-07-25 DIAGNOSIS — Z1211 Encounter for screening for malignant neoplasm of colon: Secondary | ICD-10-CM

## 2020-07-25 NOTE — Progress Notes (Signed)
Subjective:    Patient ID: [name], female    DOB: [birth year], 70 y.o.   MRN: [id number]  HPI: [name] is a 70 y.o. female presenting for "swollen stomach."  Chief Complaint  Patient presents with  . Bloated    4 days of bloating, feels hernia is causing some effects of this. Has had hernia for yrs   ABDOMINAL ISSUES Duration: days Nature: "bloating"; feels pressure on bladder; wonder if it is a side effect from the hernia Location: diffuse  Severity: no pain Radiation: no Episode duration: constant Frequency: constant Alleviating factors: nothing  Aggravating factors: nothing Treatments attempted: nothing Constipation: no Diarrhea: no Episodes of diarrhea/day: no Mucous in the stool: no Heartburn: no; takes omeprazole Bloating:no Flatulence: no Nausea: no Vomiting: no Episodes of vomit/day: 0 Melena or hematochezia: no Rash: no Jaundice: no Fever: no Weight loss: no  Normal diet: eats cabbage, cauliflower, black eyed peas, pasta salad, water - drinks at least 8 bottles per day.  Has a history of nephrectomy with Dr. Etta Grandchild in [place]; would like to be referred to a Nephrologist to monitor her solitary kidney closely.  Allergies  Allergen Reactions  . Bisphosphonates Other (See Comments)    Osteonecrosis Jaw    Outpatient Encounter Medications as of 07/25/2020  Medication Sig  . albuterol (VENTOLIN HFA) 108 (90 Base) MCG/ACT inhaler Inhale 1-2 puffs into the lungs every 6 (six) hours as needed for wheezing or shortness of breath.  . ALPRAZolam (XANAX) 0.25 MG tablet Take 1 tablet (0.25 mg total) by mouth daily.  Marland Kitchen amLODipine (NORVASC) 10 MG tablet TAKE 1 TABLET BY MOUTH EVERY DAY (Patient taking differently: Take 10 mg by mouth daily.)  . aspirin 81 MG tablet Take 81 mg by mouth daily.  . carvedilol (COREG) 3.125 MG tablet TAKE 1 TABLET (3.125 MG TOTAL) BY MOUTH 2 (TWO) TIMES DAILY WITH A MEAL.  Marland Kitchen ezetimibe (ZETIA) 10 MG tablet TAKE 1 TABLET  (10 MG TOTAL) BY MOUTH DAILY. FOR CHOLESTEROL  . lisinopril (ZESTRIL) 20 MG tablet TAKE 1 TABLET BY MOUTH EVERY DAY  . nitroGLYCERIN (NITROSTAT) 0.4 MG SL tablet PLACE 1 TABLET ON THE TONGUE &LET DISSOLVE EVERY 5 MINS AS NEEDED FOR CHEST PAIN (Patient taking differently: Place 0.4 mg under the tongue every 5 (five) minutes as needed for chest pain.)  . rosuvastatin (CRESTOR) 40 MG tablet TAKE 1 TABLET BY MOUTH EVERY DAY  . triamcinolone cream (KENALOG) 0.1 % Apply 1 application topically 2 (two) times daily.  . vitamin B-12 (CYANOCOBALAMIN) 100 MCG tablet Take 100 mcg by mouth daily.  . vitamin E 100 UNIT capsule Take 100 Units by mouth daily.   . [DISCONTINUED] omeprazole (PRILOSEC) 10 MG capsule TAKE 1 CAPSULE (10 MG TOTAL) BY MOUTH DAILY AS NEEDED.   No facility-administered encounter medications on file as of 07/25/2020.    Patient Active Problem List   Diagnosis Date Noted  . 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 Coronary artery disease involving native coronary artery of native heart without angina pectoris 11/20/2017  . Ischemic cardiomyopathy 05/20/2017  . STEMI involving left anterior descending coronary artery (HCC) 05/12/2017  . CAD S/P percutaneous coronary angioplasty 10/24/2016  . PVD (peripheral vascular disease) (HCC) 10/24/2016  . Carotid bruit present 03/12/2016  . Essential hypertension 05/29/2015  . COPD (chronic obstructive pulmonary disease) (HCC) 02/01/2015  . Solitary kidney, acquired 02/01/2015  . History of nephrolithiasis 02/01/2015  . Hx of CABG 01/30/2015  . Dyslipidemia 01/30/2015  . Smoker 01/30/2015  .  Vitamin D deficiency 01/30/2015  . Osteoporosis 01/30/2015  . Subclavian artery stenosis, left (HCC) 01/30/2015    Past Medical History:  Diagnosis Date  . Allergy   . COPD (chronic obstructive pulmonary disease) (HCC)   . Coronary artery disease 05/16/1995   Cathed in November, 05/20/1995--CABG x 1-----LIMA to LAD, 11/18 STEMI PCI/DES pLAD,  EF 45-50%  . GERD (gastroesophageal reflux disease)   . History of kidney stones    surgery left kidney removed  . Hyperlipidemia   . Hypertension   . Myocardial infarction (HCC)   . Nephrolithiasis    Left Nephrectomy   . 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 Peripheral vascular disease (HCC)    carotid to subclavian artery bypass graft  . Smoker     Relevant past medical, surgical, family and social history reviewed and updated as indicated. Interim medical history since our last visit reviewed.  Review of Systems  Constitutional: Negative.  Negative for fever.  Respiratory: Negative.  Negative for shortness of breath.   Cardiovascular: Negative.  Negative for chest pain and palpitations.  Gastrointestinal: Positive for abdominal distention. Negative for abdominal pain, blood in stool, constipation, diarrhea, nausea, rectal pain and vomiting.   Per HPI unless specifically indicated above     Objective:    BP (!) 154/88   Pulse 71   Temp (!) 97.2 F (36.2 C)   Ht 5\' 6"  (1.676 m)   Wt 104 lb (47.2 kg)   LMP  (LMP Unknown)   SpO2 95%   BMI 16.79 kg/m   Wt Readings from Last 3 Encounters:  07/25/20 104 lb (47.2 kg)  05/30/20 105 lb (47.6 kg)  04/18/20 101 lb (45.8 kg)    Physical Exam Vitals and nursing note reviewed.  Constitutional:      General: She is not in acute distress.    Appearance: Normal appearance. She is not toxic-appearing.  Eyes:     General: No scleral icterus.    Extraocular Movements: Extraocular movements intact.  Cardiovascular:     Rate and Rhythm: Normal rate.     Pulses:          Femoral pulses are 1+ on the right side and 0 on the left side.    Heart sounds: Normal heart sounds. No murmur heard.   Pulmonary:     Effort: Pulmonary effort is normal. No respiratory distress.     Breath sounds: Normal breath sounds. No wheezing, rhonchi or rales.  Abdominal:     General: There is abdominal bruit. There is no distension.      Palpations: Abdomen is soft. There is no fluid wave or pulsatile mass.     Tenderness: There is no abdominal tenderness. There is no right CVA tenderness, left CVA tenderness, guarding or rebound.     Hernia: No hernia is present.  Musculoskeletal:        General: Normal range of motion.     Cervical back: Normal range of motion.     Right lower leg: No edema.     Left lower leg: No edema.  Lymphadenopathy:     Cervical: No cervical adenopathy.  Skin:    General: Skin is warm and dry.     Capillary Refill: Capillary refill takes less than 2 seconds.     Coloration: Skin is not jaundiced or pale.     Findings: No erythema.  Neurological:     General: No focal deficit present.     Mental Status: She is  alert and oriented to person, place, and time.     Motor: No weakness.     Gait: Gait normal.  Psychiatric:        Mood and Affect: Mood normal.        Behavior: Behavior normal.        Thought Content: Thought content normal.        Judgment: Judgment normal.     Results for orders placed or performed in visit on 04/14/20  Basic metabolic panel  Result Value Ref Range   Glucose, Bld 90 65 - 99 mg/dL   BUN 23 7 - 25 mg/dL   Creat 9.21 (H) 1.94 - 0.99 mg/dL   BUN/Creatinine Ratio 23 (H) 6 - 22 (calc)   Sodium 141 135 - 146 mmol/L   Potassium 4.8 3.5 - 5.3 mmol/L   Chloride 109 98 - 110 mmol/L   CO2 25 20 - 32 mmol/L   Calcium 10.3 8.6 - 10.4 mg/dL      Assessment & Plan:  1. Bloating Acute, ongoing x weeks.  Unclear etiology, differentials include gas, AAA, constipation, IBS, undefined mass.  No red flags in history or on examination.  Had vascular ultrasound in 2018 for abdominal bruit which did not show AAA.  Follows closely with Vascular Surgery for subclavian stenosis.  Will obtain KUB for further assessment and consider CT with contrast if KUB negative.  Would like to avoid contrast if possible given solitary kidney.  Patient in agreement with this plan.  Will also place  referral to GI for overdue screening colonoscopy.    - Urinalysis, Routine w reflex microscopic  2. 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  Requesting referral to kidney doctor for ongoing management of solitary kidney.  Referral placed today.  Follow up plan: Return if symptoms worsen or fail to improve.

## 2020-07-26 ENCOUNTER — Other Ambulatory Visit: Payer: Self-pay | Admitting: Family Medicine

## 2020-07-27 ENCOUNTER — Ambulatory Visit
Admission: RE | Admit: 2020-07-27 | Discharge: 2020-07-27 | Disposition: A | Discharge status: Home / Self Care | Payer: 59 | Source: Ambulatory Visit | Attending: Nurse Practitioner | Admitting: Nurse Practitioner

## 2020-07-27 ENCOUNTER — Telehealth: Payer: Self-pay | Admitting: Nurse Practitioner

## 2020-07-27 ENCOUNTER — Other Ambulatory Visit: Payer: Self-pay | Admitting: Nurse Practitioner

## 2020-07-27 DIAGNOSIS — R140 Abdominal distension (gaseous): Secondary | ICD-10-CM

## 2020-07-27 NOTE — Telephone Encounter (Signed)
Called patient and left message to return call to clinic to discuss abdominal x-ray results.

## 2020-07-31 ENCOUNTER — Other Ambulatory Visit: Payer: Self-pay | Admitting: Family Medicine

## 2020-08-02 ENCOUNTER — Telehealth: Payer: Self-pay | Admitting: Family Medicine

## 2020-08-02 NOTE — Telephone Encounter (Signed)
UA was  not collected, if she is having urinary symptoms, she needs to come by and just leave sample Order UA/Urine culture If symptoms resolved nothing further needed at this time

## 2020-08-02 NOTE — Telephone Encounter (Signed)
Spoke with pt regarding result from xray, says bloating has  gone all the way down. Does not feel she needs CT at this time. Sending message to Selena Batten to investigate urine testing that has not been resulted.

## 2020-08-02 NOTE — Telephone Encounter (Signed)
Call for test results  °

## 2020-08-04 ENCOUNTER — Other Ambulatory Visit: Payer: Medicare Other

## 2020-08-04 ENCOUNTER — Other Ambulatory Visit: Payer: Self-pay

## 2020-08-04 ENCOUNTER — Other Ambulatory Visit: Payer: Self-pay | Admitting: *Deleted

## 2020-08-04 ENCOUNTER — Telehealth: Payer: Self-pay | Admitting: Family Medicine

## 2020-08-04 DIAGNOSIS — R350 Frequency of micturition: Secondary | ICD-10-CM

## 2020-08-04 DIAGNOSIS — R300 Dysuria: Secondary | ICD-10-CM

## 2020-08-04 LAB — MICROSCOPIC MESSAGE

## 2020-08-04 LAB — URINALYSIS, ROUTINE W REFLEX MICROSCOPIC
Bilirubin Urine: NEGATIVE
Glucose, UA: NEGATIVE
Hgb urine dipstick: NEGATIVE
Nitrite: NEGATIVE
Specific Gravity, Urine: 1.02 (ref 1.001–1.03)
pH: 5.5 (ref 5.0–8.0)

## 2020-08-04 NOTE — Telephone Encounter (Signed)
Call for urine Lab results

## 2020-08-04 NOTE — Telephone Encounter (Signed)
Call placed to patient and patient made aware.   Future orders placed.  

## 2020-08-04 NOTE — Telephone Encounter (Signed)
Labs have not been completed at this time. Will await results then call back.

## 2020-08-05 LAB — URINE CULTURE
MICRO NUMBER:: 11552424
SPECIMEN QUALITY:: ADEQUATE

## 2020-08-10 ENCOUNTER — Telehealth: Payer: Self-pay | Admitting: *Deleted

## 2020-08-10 DIAGNOSIS — F17200 Nicotine dependence, unspecified, uncomplicated: Secondary | ICD-10-CM

## 2020-08-10 DIAGNOSIS — J439 Emphysema, unspecified: Secondary | ICD-10-CM

## 2020-08-10 NOTE — Telephone Encounter (Signed)
Received IT consultant Form.   Patient came to office to discuss form. States that she has an ignition interlock device due to DUI. Reports that she is unable to complete testing required for ignition due to COPD. Form is requesting to decrease breath volume so that she may perform required testing.   In [place], drivers who need a breath volume reduction for their Ignition Interlock can contact the [facility] at [phone]. The DMV will provide you the correct steps to take to be considered eligible for lowering your Ignition Interlock's breath volume due to a medical condition. Once approved, our friendly service technicians will reduce your Ignition Interlock's breath volume and you'll be back on the road!  If you ever have any questions during your Ignition Interlock program, feel free to call our 24/7/365 Customer Care Center for help at [phone]. If your questions are about lowering the breath volume in your Ignition Interlock, you can contact the DMV directly at [phone] for assistance.

## 2020-08-11 NOTE — Telephone Encounter (Signed)
Call placed to patient. No answer. No VM.  

## 2020-08-11 NOTE — Telephone Encounter (Signed)
She does have COPD  Her DMV form requires her to have PFT/Spirometry, we do not do this at this office I can not complete this form without the results of the breathing test to prove she is unable to use the device or need decrease breath volumes

## 2020-08-15 NOTE — Telephone Encounter (Signed)
Call placed to patient. LMTRC.  

## 2020-08-16 NOTE — Telephone Encounter (Signed)
Orders placed for PFT.   Will F/U with Shanda Bumps after testing.

## 2020-08-16 NOTE — Telephone Encounter (Signed)
Call placed to patient and patient made aware.   Patient is agreeable to PFt/ pulmonology referral.

## 2020-08-16 NOTE — Telephone Encounter (Signed)
Orders placed for PFT

## 2020-08-16 NOTE — Telephone Encounter (Signed)
Pt can follow up with Shanda Bumps after she has had PFT done, to go over results  I will give you form to keep on hand

## 2020-08-22 ENCOUNTER — Telehealth: Payer: Self-pay | Admitting: Cardiology

## 2020-08-22 NOTE — Telephone Encounter (Signed)
Left detailed message, ok per DPR  uncompleted form at front for pick up

## 2020-08-22 NOTE — Telephone Encounter (Signed)
Spoke to patient she stated she brought breathing forms to office a couple of weeks ago.Stated she was told Dr.Hochrein could not complete forms.Stated she would like to come by office this afternoon to pick up forms.

## 2020-08-22 NOTE — Telephone Encounter (Signed)
Follow Up:     Pt said she dropped off some forms a few weeks ago. She need to pick them up and take to her other doctor. She knows they are not completed. Please call to let her know when she can stop by to get them .

## 2020-08-25 ENCOUNTER — Encounter: Payer: Self-pay | Admitting: Gastroenterology

## 2020-08-29 ENCOUNTER — Other Ambulatory Visit: Payer: Self-pay | Admitting: *Deleted

## 2020-08-29 DIAGNOSIS — J439 Emphysema, unspecified: Secondary | ICD-10-CM

## 2020-09-18 ENCOUNTER — Other Ambulatory Visit: Payer: Self-pay | Admitting: Family Medicine

## 2020-09-18 NOTE — Telephone Encounter (Signed)
Ok to refill??  Last office visit 04/10/2020.  Last refill 02/08/2020.

## 2020-09-19 ENCOUNTER — Other Ambulatory Visit: Payer: Self-pay | Admitting: *Deleted

## 2020-09-19 DIAGNOSIS — F17200 Nicotine dependence, unspecified, uncomplicated: Secondary | ICD-10-CM

## 2020-09-19 DIAGNOSIS — J439 Emphysema, unspecified: Secondary | ICD-10-CM

## 2020-10-10 ENCOUNTER — Other Ambulatory Visit: Payer: Self-pay | Admitting: Cardiology

## 2020-10-17 ENCOUNTER — Other Ambulatory Visit: Payer: Self-pay | Admitting: Cardiology

## 2020-10-17 ENCOUNTER — Encounter: Payer: 59 | Admitting: Gastroenterology

## 2020-10-17 NOTE — Telephone Encounter (Signed)
Rx(s) sent to pharmacy electronically.  

## 2020-10-28 ENCOUNTER — Other Ambulatory Visit: Payer: Self-pay | Admitting: Family Medicine

## 2020-11-03 ENCOUNTER — Other Ambulatory Visit: Payer: Self-pay

## 2020-11-08 ENCOUNTER — Other Ambulatory Visit: Payer: Self-pay

## 2021-01-05 ENCOUNTER — Other Ambulatory Visit: Payer: Self-pay | Admitting: Family Medicine

## 2021-01-19 ENCOUNTER — Other Ambulatory Visit: Payer: Self-pay

## 2021-01-19 DIAGNOSIS — I771 Stricture of artery: Secondary | ICD-10-CM

## 2021-01-19 NOTE — Progress Notes (Unsigned)
Patient presents today at clinic as a walk in. She has had neck pain and is weak, tired, lightheaded, and sob. She is also having pain in the back of her neck. Her speech is pressured and she is very concerned about her issues. Mentioned "eye dementia" and desiring "a double bypass because she is a different type of woman." Advised patient her symptoms did not seem related to her carotids, but more likely related to history of COPD and osteoporosis. She would like an appointment to evaluate her carotids and discuss with the MD. Placed patient on the schedule for the end of the month.

## 2021-01-21 ENCOUNTER — Other Ambulatory Visit: Payer: Self-pay | Admitting: Nurse Practitioner

## 2021-02-02 ENCOUNTER — Encounter (HOSPITAL_COMMUNITY): Payer: Self-pay | Admitting: Emergency Medicine

## 2021-02-02 ENCOUNTER — Emergency Department (HOSPITAL_COMMUNITY)
Admission: EM | Admit: 2021-02-02 | Discharge: 2021-02-02 | Disposition: A | Discharge status: Home / Self Care | Payer: 59 | Attending: Emergency Medicine | Admitting: Emergency Medicine

## 2021-02-02 ENCOUNTER — Emergency Department (HOSPITAL_COMMUNITY): Payer: 59

## 2021-02-02 DIAGNOSIS — Z20822 Contact with and (suspected) exposure to covid-19: Secondary | ICD-10-CM | POA: Insufficient documentation

## 2021-02-02 DIAGNOSIS — R55 Syncope and collapse: Secondary | ICD-10-CM | POA: Diagnosis not present

## 2021-02-02 DIAGNOSIS — I10 Essential (primary) hypertension: Secondary | ICD-10-CM | POA: Diagnosis not present

## 2021-02-02 DIAGNOSIS — Z955 Presence of coronary angioplasty implant and graft: Secondary | ICD-10-CM | POA: Insufficient documentation

## 2021-02-02 DIAGNOSIS — R531 Weakness: Secondary | ICD-10-CM | POA: Insufficient documentation

## 2021-02-02 DIAGNOSIS — Z79899 Other long term (current) drug therapy: Secondary | ICD-10-CM | POA: Diagnosis not present

## 2021-02-02 DIAGNOSIS — Z7982 Long term (current) use of aspirin: Secondary | ICD-10-CM | POA: Insufficient documentation

## 2021-02-02 DIAGNOSIS — J449 Chronic obstructive pulmonary disease, unspecified: Secondary | ICD-10-CM | POA: Diagnosis not present

## 2021-02-02 DIAGNOSIS — I2510 Atherosclerotic heart disease of native coronary artery without angina pectoris: Secondary | ICD-10-CM | POA: Insufficient documentation

## 2021-02-02 DIAGNOSIS — N179 Acute kidney failure, unspecified: Secondary | ICD-10-CM | POA: Diagnosis not present

## 2021-02-02 LAB — URINALYSIS, ROUTINE W REFLEX MICROSCOPIC
Bilirubin Urine: NEGATIVE
Glucose, UA: NEGATIVE mg/dL
Hgb urine dipstick: NEGATIVE
Ketones, ur: NEGATIVE mg/dL
Nitrite: NEGATIVE
Protein, ur: NEGATIVE mg/dL
Specific Gravity, Urine: 1.013 (ref 1.005–1.030)
pH: 5 (ref 5.0–8.0)

## 2021-02-02 LAB — BASIC METABOLIC PANEL
Anion gap: 7 (ref 5–15)
BUN: 24 mg/dL — ABNORMAL HIGH (ref 8–23)
CO2: 26 mmol/L (ref 22–32)
Calcium: 10 mg/dL (ref 8.9–10.3)
Chloride: 106 mmol/L (ref 98–111)
Creatinine, Ser: 1.32 mg/dL — ABNORMAL HIGH (ref 0.44–1.00)
GFR, Estimated: 44 mL/min — ABNORMAL LOW (ref >60)
Glucose, Bld: 105 mg/dL — ABNORMAL HIGH (ref 70–99)
Potassium: 4.3 mmol/L (ref 3.5–5.1)
Sodium: 139 mmol/L (ref 135–145)

## 2021-02-02 LAB — CBC
HCT: 44.1 % (ref 36.0–46.0)
Hemoglobin: 14.5 g/dL (ref 12.0–15.0)
MCH: 31.3 pg (ref 26.0–34.0)
MCHC: 32.9 g/dL (ref 30.0–36.0)
MCV: 95.2 fL (ref 80.0–100.0)
Platelets: 264 10*3/uL (ref 150–400)
RBC: 4.63 MIL/uL (ref 3.87–5.11)
RDW: 14.2 % (ref 11.5–15.5)
WBC: 6.8 10*3/uL (ref 4.0–10.5)
nRBC: 0 % (ref 0.0–0.2)

## 2021-02-02 LAB — RESP PANEL BY RT-PCR (FLU A&B, COVID) ARPGX2
Influenza A by PCR: NEGATIVE
Influenza B by PCR: NEGATIVE
SARS Coronavirus 2 by RT PCR: NEGATIVE

## 2021-02-02 LAB — TROPONIN I (HIGH SENSITIVITY): Troponin I (High Sensitivity): 6 ng/L (ref <18)

## 2021-02-02 MED ORDER — SODIUM CHLORIDE 0.9 % IV BOLUS
500.0000 mL | Freq: Once | INTRAVENOUS | Status: AC
  Administered 2021-02-02: 500 mL via INTRAVENOUS

## 2021-02-02 NOTE — Discharge Instructions (Addendum)
Your lab results today showed evidence of kidney injury, this could be secondary to dehydration. Please drink lots of water of the next several days and return to ED for any worsening symptoms.

## 2021-02-02 NOTE — ED Triage Notes (Signed)
Patient here for evaluation of near syncope this morning while cooking, then followed by sudden onset of generalized weakness. Patient states now she feels very weak and tired.

## 2021-02-02 NOTE — ED Provider Notes (Signed)
[facility] EMERGENCY DEPARTMENT Provider Note   CSN: [id number] Arrival date & time: 02/02/21  1235     History Chief Complaint  Patient presents with   Loss of Consciousness    Nakeia Calvi is a 70 y.o. female.  Patient is a 70 yo female with PMH of CABG, one kidney, HtN, hyperlipidemia, and copd presenting for weakness and fatigue. Patient states today at 10 am she was in the kitchen when she began to feel acutely weak and fatigued. Got herself to the couch then "almost passed out, I could feel my eyes going back and knees going weak, then I had no energy and had to go lay down". Denies actual LOC. Denies fall to floor or head trauma. States two weeks ago she had neck pain, body aches, and fatigue that lasted 2-3 days. Followed with urgent care and had negative covid test. States over the last week she has felt well. Denies fevers, chills, nausea, vomiting, diarrhea, or dysuria. Chest chest pain or sob with episode.   The history is provided by the patient. No language interpreter was used.  Loss of Consciousness Episode history:  Single Most recent episode:  Today Timing:  Constant Associated symptoms: weakness (generalized)   Associated symptoms: no chest pain, no fever, no palpitations, no seizures, no shortness of breath and no vomiting       Past Medical History:  Diagnosis Date   Allergy    COPD (chronic obstructive pulmonary disease) (HCC)    Coronary artery disease 05/16/1995   Cathed in November, 05/20/1995--CABG x 1-----LIMA to LAD, 11/18 STEMI PCI/DES pLAD, EF 45-50%   GERD (gastroesophageal reflux disease)    History of kidney stones    surgery left kidney removed   Hyperlipidemia    Hypertension    Myocardial infarction [facility])    Nephrolithiasis    Left Nephrectomy    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, jaw (HCC)    Peripheral vascular disease (HCC)    carotid to subclavian artery bypass graft   Smoker     Patient Active Problem List    Diagnosis Date Noted   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   Coronary artery disease involving native coronary artery of native heart without angina pectoris 11/20/2017   Ischemic cardiomyopathy 05/20/2017   STEMI involving left anterior descending coronary artery (HCC) 05/12/2017   CAD S/P percutaneous coronary angioplasty 10/24/2016   PVD (peripheral vascular disease) (HCC) 10/24/2016   Carotid bruit present 03/12/2016   Essential hypertension 05/29/2015   COPD (chronic obstructive pulmonary disease) (HCC) 02/01/2015   Solitary kidney, acquired 02/01/2015   History of nephrolithiasis 02/01/2015   Hx of CABG 01/30/2015   Dyslipidemia 01/30/2015   Smoker 01/30/2015   Vitamin D deficiency 01/30/2015   Osteoporosis 01/30/2015   Subclavian artery stenosis, left (HCC) 01/30/2015    Past Surgical History:  Procedure Laterality Date   CAROTID ARTERY - SUBCLAVIAN ARTERY BYPASS GRAFT Left 04/18/2007   COLONOSCOPY  2008   CORONARY ARTERY BYPASS GRAFT     CORONARY/GRAFT ACUTE MI REVASCULARIZATION N/A 05/12/2017   Procedure: Coronary/Graft Acute MI Revascularization;  Surgeon: Tonny Bollman, MD;  Location: [facility] INVASIVE CV LAB;  Service: Cardiovascular;  Laterality: N/A;   DEBRIDEMENT MANDIBLE Left 03/23/2019   Procedure: DEBRIDEMENT LEFT MANDIBLE,SEQUESTRECTOMY;  Surgeon: Vivia Ewing, DMD;  Location: [facility] OR;  Service: Oral Surgery;  Laterality: Left;   KIDNEY SURGERY Left    70 yo   LEFT HEART CATH AND CORONARY ANGIOGRAPHY N/A 05/12/2017   Procedure: LEFT HEART  CATH AND CORONARY ANGIOGRAPHY;  Surgeon: Tonny Bollmanooper, Michael, MD;  [facility];  Service: Cardiovascular;  Laterality: N/A;   Left Nephrectomy Left 10/17/1983   Dr. Sural---StagHornCalculus, Left Kidney Nonfunctioning wiht Acute and Chronic Pyelonephritis   WISDOM TOOTH EXTRACTION       OB History   No obstetric history on file.     Family History  Problem Relation Age of Onset   Depression Sister    COPD  Father    Cancer Sister        hysterectomy sec to cancer   Cancer Sister        cervical cancer   COPD Brother     Social History   Tobacco Use   Smoking status: Every Day    Packs/day: 0.25    Years: 40.00    Pack years: 10.00    Types: Cigarettes   Smokeless tobacco: Never  Vaping Use   Vaping Use: Never used  Substance Use Topics   Alcohol use: Yes    Comment: Occasional Beer   Drug use: No    Home Medications Prior to Admission medications   Medication Sig Start Date End Date Taking? Authorizing Provider  ALPRAZolam Prudy Feeler(XANAX) 0.25 MG tablet TAKE 1 TABLET BY MOUTH EVERY DAY 09/18/20   Donita BrooksPickard, Warren T, MD  albuterol (VENTOLIN HFA) 108 (90 Base) MCG/ACT inhaler Inhale 1-2 puffs into the lungs every 6 (six) hours as needed for wheezing or shortness of breath. 10/28/19   [place], Velna HatchetKawanta F, MD  amLODipine (NORVASC) 10 MG tablet TAKE 1 TABLET BY MOUTH EVERY DAY Patient taking differently: Take 10 mg by mouth daily. 05/25/18   Rollene RotundaHochrein, James, MD  aspirin 81 MG tablet Take 81 mg by mouth daily.    [provider]  carvedilol (COREG) 3.125 MG tablet TAKE 1 TABLET (3.125 MG TOTAL) BY MOUTH 2 (TWO) TIMES DAILY WITH A MEAL. 04/10/20   Marykay LexHarding, David W, MD  ezetimibe (ZETIA) 10 MG tablet TAKE 1 TABLET (10 MG TOTAL) BY MOUTH DAILY. FOR CHOLESTEROL 09/18/20   Donita BrooksPickard, Warren T, MD  lisinopril (ZESTRIL) 20 MG tablet TAKE 1 TABLET BY MOUTH EVERY DAY 04/28/20   [place]son, Velna HatchetKawanta F, MD  nitroGLYCERIN (NITROSTAT) 0.4 MG SL tablet PLACE 1 TABLET ON THE TONGUE &LET DISSOLVE EVERY 5 MINS AS NEEDED FOR CHEST PAIN 07/31/20   Salley Scarleturham, Kawanta F, MD  omeprazole (PRILOSEC) 10 MG capsule TAKE 1 CAPSULE (10 MG TOTAL) BY MOUTH DAILY AS NEEDED. 07/26/20   Valentino NoseMartinez, Jessica A, NP  rosuvastatin (CRESTOR) 40 MG tablet Take 1 tablet (40 mg total) by mouth daily. NEED APPOINTMENT 10/17/20   Rollene RotundaHochrein, James, MD  triamcinolone cream (KENALOG) 0.1 % Apply 1 application topically 2 (two) times daily. 06/22/18   [place],  Velna HatchetKawanta F, MD  vitamin B-12 (CYANOCOBALAMIN) 100 MCG tablet Take 100 mcg by mouth daily.    [provider]  vitamin E 100 UNIT capsule Take 100 Units by mouth daily.     [provider]    Allergies    Bisphosphonates  Review of Systems   Review of Systems  Constitutional:  Positive for fatigue. Negative for chills and fever.  HENT:  Negative for ear pain and sore throat.   Eyes:  Negative for pain and visual disturbance.  Respiratory:  Negative for cough and shortness of breath.   Cardiovascular:  Positive for syncope. Negative for chest pain and palpitations.  Gastrointestinal:  Negative for abdominal pain and vomiting.  Genitourinary:  Negative for dysuria and  hematuria.  Musculoskeletal:  Negative for arthralgias and back pain.  Skin:  Negative for color change and rash.  Neurological:  Positive for weakness (generalized). Negative for seizures and syncope.  All other systems reviewed and are negative.  Physical Exam Updated Vital Signs BP (!) 195/96   Pulse 87   Temp 98.2 F (36.8 C) (Oral)   Resp (!) 25   LMP  (LMP Unknown)   SpO2 100%   Physical Exam Vitals and nursing note reviewed.  Constitutional:      General: She is not in acute distress.    Appearance: She is well-developed.  HENT:     Head: Normocephalic and atraumatic.  Eyes:     Conjunctiva/sclera: Conjunctivae normal.  Cardiovascular:     Rate and Rhythm: Normal rate and regular rhythm.     Heart sounds: No murmur heard. Pulmonary:     Effort: Pulmonary effort is normal. No respiratory distress.     Breath sounds: Normal breath sounds.  Abdominal:     Palpations: Abdomen is soft.     Tenderness: There is no abdominal tenderness.  Musculoskeletal:     Cervical back: Neck supple.  Skin:    General: Skin is warm and dry.  Neurological:     Mental Status: She is alert.     GCS: GCS eye subscore is 4. GCS verbal subscore is 5. GCS motor subscore is 6.     Cranial Nerves: Cranial  nerves are intact.     Sensory: Sensation is intact.     Motor: Motor function is intact.     Coordination: Coordination is intact.    ED Results / Procedures / Treatments   Labs (all labs ordered are listed, but only abnormal results are displayed) Labs Reviewed  BASIC METABOLIC PANEL - Abnormal; Notable for the following components:      Result Value   Glucose, Bld 105 (*)    BUN 24 (*)    Creatinine, Ser 1.32 (*)    GFR, Estimated 44 (*)    All other components within normal limits  URINALYSIS, ROUTINE W REFLEX MICROSCOPIC - Abnormal; Notable for the following components:   Leukocytes,Ua SMALL (*)    Bacteria, UA MANY (*)    All other components within normal limits  RESP PANEL BY RT-PCR (FLU A&B, COVID) ARPGX2  CBC  CBG MONITORING, ED  TROPONIN I (HIGH SENSITIVITY)  TROPONIN I (HIGH SENSITIVITY)    EKG None  Radiology No results found.  Procedures Procedures   Medications Ordered in ED Medications - No data to display  ED Course  I have reviewed the triage vital signs and the nursing notes.  Pertinent labs & imaging results that were available during my care of the patient were reviewed by me and considered in my medical decision making (see chart for details).    MDM Rules/Calculators/A&P                         4:45 PM 70 yo female presenting for weakness and fatigue. Patient is Aox3, no acute distress, afebrile, with stable vitals. Physical exam demonstrates no neurovascular deficits. EKG stable with no ST segment elevation or depression, stable troponin, electrolytes, and CXR. AKI present likely secondary to dehydration resulting in near syncope. Patient requesting discharge home at this time. Recommended for increased fluid intact and to return for any repeat symptoms.   Patient in no distress and overall condition improved here in the ED. Detailed discussions were had  with the patient regarding current findings, and need for close f/u with PCP or on call  doctor. The patient has been instructed to return immediately if the symptoms worsen in any way for re-evaluation. Patient verbalized understanding and is in agreement with current care plan. All questions answered prior to discharge.       Final Clinical Impression(s) / ED Diagnoses Final diagnoses:  AKI (acute kidney injury) (HCC)  Near syncope    Rx / DC Orders ED Discharge Orders     None        Franne Forts, DO 02/03/21 0118

## 2021-02-02 NOTE — ED Provider Notes (Signed)
Emergency Medicine Provider Triage Evaluation Note  [name] , a 70 y.o. female  was evaluated in triage.  Pt complains of near syncope this morning she states that she felt severely weak and fatigued after the episode.  Just prior to the episode she felt somewhat short of breath but states that she denies any chest pain or chest pressure or tightness. She states that she was tested for COVID 2 weeks ago and was negative.  She has a history of CABG.  Review of Systems  Positive: Near syncope Negative: Fever  Physical Exam  BP (!) 126/104 (BP Location: Right Arm)   Pulse 84   Temp 98.3 F (36.8 C) (Oral)   Resp 16   LMP  (LMP Unknown)   SpO2 100%  Gen:   Awake, no distress   Resp:  Normal effort  MSK:   Moves extremities without difficulty  Other:    Medical Decision Making  Medically screening exam initiated at 1:48 PM.  Appropriate orders placed.  [name] was informed that the remainder of the evaluation will be completed by another provider, this initial triage assessment does not replace that evaluation, and the importance of remaining in the ED until their evaluation is complete.  Added on troponin and COVID-19 test.   [name], PA 02/02/21 1352    [name], MD 02/02/21 1444

## 2021-02-13 ENCOUNTER — Ambulatory Visit (HOSPITAL_COMMUNITY)
Admission: RE | Admit: 2021-02-13 | Discharge: 2021-02-13 | Disposition: A | Discharge status: Home / Self Care | Payer: 59 | Source: Ambulatory Visit | Attending: Vascular Surgery | Admitting: Vascular Surgery

## 2021-02-13 ENCOUNTER — Other Ambulatory Visit: Payer: Self-pay

## 2021-02-13 ENCOUNTER — Ambulatory Visit (INDEPENDENT_AMBULATORY_CARE_PROVIDER_SITE_OTHER): Payer: 59 | Admitting: Vascular Surgery

## 2021-02-13 ENCOUNTER — Encounter: Payer: Self-pay | Admitting: Vascular Surgery

## 2021-02-13 VITALS — BP 162/72 | HR 83 | Temp 98.1°F | Resp 14 | Ht 66.0 in | Wt 102.0 lb

## 2021-02-13 DIAGNOSIS — I771 Stricture of artery: Secondary | ICD-10-CM

## 2021-02-13 DIAGNOSIS — I779 Disorder of arteries and arterioles, unspecified: Secondary | ICD-10-CM | POA: Insufficient documentation

## 2021-02-13 DIAGNOSIS — I6523 Occlusion and stenosis of bilateral carotid arteries: Secondary | ICD-10-CM

## 2021-02-13 NOTE — Progress Notes (Signed)
Patient name: [name] MRN: [id number] DOB: 1951/03/09 Sex: female  REASON FOR CONSULT: 1 year follow-up.  Carotid surveillance.  Previously occluded left carotid subclavian bypass.  HPI: [name] is a 70 y.o. female, with multiple medical problems including tobacco abuse, COPD, coronary artery disease status post CABG, known left subclavian artery disease with left carotid subclavian bypass, hypertension, hyperlipidemia that presents for 1 year follow-up for carotid surveillance.  She reports no strokes or TIA's over the last year.  She cannot remember any focal symptoms on one side of her body.  She has been worked up in the ED for chest pain.  During her last visit a year ago she was referred for subclavian disease and we discovered that her left carotid subclavian bypass placed by Dr. Arbie Cookey was occluded.  No intervention was performed given no left upper extremity symptoms.  She continues to deny any left arm symptoms today.   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 INVASIVE CV LAB;  Service: Cardiovascular;   Laterality: N/A;   DEBRIDEMENT MANDIBLE Left 03/23/2019   Procedure: DEBRIDEMENT LEFT MANDIBLE,SEQUESTRECTOMY;  Surgeon: Vivia Ewing, DMD;  Location: [facility] OR;  Service: Oral Surgery;  Laterality: Left;   KIDNEY SURGERY Left    70 yo   LEFT HEART CATH AND CORONARY ANGIOGRAPHY N/A 05/12/2017   Procedure: LEFT HEART CATH AND CORONARY ANGIOGRAPHY;  Surgeon: Tonny Bollman, MD;  Location: [facility] INVASIVE CV LAB;  Service: Cardiovascular;  Laterality: N/A;   Left Nephrectomy Left 10/17/1983   Dr. Sural---Stag[place]ornCalculus, Left Kidney Nonfunctioning wiht Acute and Chronic Pyelonephritis   WISDOM TOOTH EXTRACTION      Family History  Problem Relation Age of Onset   Depression Sister    COPD Father    Cancer Sister        hysterectomy sec to cancer   Cancer Sister        cervical cancer   COPD Brother     SOCIAL HISTORY: Social History   Socioeconomic History   Marital status: Married    Spouse name: Not on file   Number of children: 2   Years of education: Not on file   Highest education level: Not on file  Occupational History   Not on file  Tobacco Use   Smoking status: Every Day    Packs/day: 0.25    Years: 40.00    Pack years: 10.00    Types: Cigarettes   Smokeless tobacco: Never  Vaping Use   Vaping Use: Never used  Substance and Sexual Activity   Alcohol use:  Yes    Comment: Occasional Beer   Drug use: No   Sexual activity: Yes    Birth control/protection: Post-menopausal  Other Topics Concern   Not on file  Social History Narrative   Lives alone.     Social Determinants of Health   Financial Resource Strain: Not on file  Food Insecurity: Not on file  Transportation Needs: Not on file  Physical Activity: Not on file  Stress: Not on file  Social Connections: Not on file  Intimate Partner Violence: Not on file    Allergies  Allergen Reactions   Bisphosphonates Other (See Comments)    Osteonecrosis Jaw     Current Outpatient Medications  Medication Sig  Dispense Refill   albuterol (VENTOLIN HFA) 108 (90 Base) MCG/ACT inhaler Inhale 1-2 puffs into the lungs every 6 (six) hours as needed for wheezing or shortness of breath. 18 g 2   ALPRAZolam (XANAX) 0.25 MG tablet TAKE 1 TABLET BY MOUTH EVERY DAY 30 tablet 0   amLODipine (NORVASC) 10 MG tablet TAKE 1 TABLET BY MOUTH EVERY DAY (Patient taking differently: Take 10 mg by mouth daily.) 90 tablet 3   aspirin 81 MG tablet Take 81 mg by mouth daily.     carvedilol (COREG) 3.125 MG tablet TAKE 1 TABLET (3.125 MG TOTAL) BY MOUTH 2 (TWO) TIMES DAILY WITH A MEAL. 60 tablet 1   ezetimibe (ZETIA) 10 MG tablet TAKE 1 TABLET (10 MG TOTAL) BY MOUTH DAILY. FOR CHOLESTEROL 90 tablet 0   lisinopril (ZESTRIL) 20 MG tablet TAKE 1 TABLET BY MOUTH EVERY DAY 90 tablet 1   nitroGLYCERIN (NITROSTAT) 0.4 MG SL tablet PLACE 1 TABLET ON THE TONGUE &LET DISSOLVE EVERY 5 MINS AS NEEDED FOR CHEST PAIN 25 tablet 1   omeprazole (PRILOSEC) 10 MG capsule TAKE 1 CAPSULE (10 MG TOTAL) BY MOUTH DAILY AS NEEDED. 90 capsule 1   rosuvastatin (CRESTOR) 40 MG tablet Take 1 tablet (40 mg total) by mouth daily. NEED APPOINTMENT 15 tablet 0   triamcinolone cream (KENALOG) 0.1 % Apply 1 application topically 2 (two) times daily. 30 g 2   vitamin B-12 (CYANOCOBALAMIN) 100 MCG tablet Take 100 mcg by mouth daily.     vitamin E 100 UNIT capsule Take 100 Units by mouth daily.      No current facility-administered medications for this visit.    REVIEW OF SYSTEMS:  [X]  denotes positive finding, [ ]  denotes negative finding Cardiac  Comments:  Chest pain or chest pressure:    Shortness of breath upon exertion:    Short of breath when lying flat:    Irregular heart rhythm:        Vascular    Pain in calf, thigh, or hip brought on by ambulation:    Pain in feet at night that wakes you up from your sleep:     Blood clot in your veins:    Leg swelling:         Pulmonary    Oxygen at home:    Productive cough:     Wheezing:          Neurologic    Sudden weakness in arms or legs:     Sudden numbness in arms or legs:     Sudden onset of difficulty speaking or slurred speech:    Temporary loss of vision in one eye:     Problems with dizziness:         Gastrointestinal    Blood in stool:  Vomited blood:         Genitourinary    Burning when urinating:     Blood in urine:        Psychiatric    Major depression:         Hematologic    Bleeding problems:    Problems with blood clotting too easily:        Skin    Rashes or ulcers:        Constitutional    Fever or chills:      PHYSICAL EXAM: Vitals:   02/13/21 1528 02/13/21 1531  BP: (!) 148/86 (!) 162/72  Pulse: 79 83  Resp: 14   Temp: 98.1 F (36.7 C)   TempSrc: Temporal   SpO2: 93%   Weight: 102 lb (46.3 kg)   Height: 5\' 6"  (1.676 m)     GENERAL: The patient is a well-nourished female, in no acute distress. The vital signs are documented above. CARDIAC: There is a regular rate and rhythm.  VASCULAR:  Palpable radial brachial pulses bilateral upper extremities PULMONARY: No respiratory distress ABDOMEN: Soft and non-tender. MUSCULOSKELETAL: There are no major deformities or cyanosis. NEUROLOGIC: No focal weakness or paresthesias are detected. SKIN: There are no ulcers or rashes noted. PSYCHIATRIC: The patient has a normal affect.  DATA:   Carotid duplex today shows no significant carotid disease with minimal 1 to 39% stenosis bilaterally.  Reversed flow in the left vertebral artery as expected with left subclavian disease.  Assessment/Plan:  70 year old female presents for follow-up of her carotid disease.  She remains asymptomatic and has had no history of strokes or TIA's.  Her duplex today shows minimal 1 to 39% stenosis bilaterally.  I discussed in the setting of asymptomatic carotid disease we will reserve surgical intervention for greater than 80% stenosis.  I think I can see her again in 3 years with carotid duplex unless she has  issues and we will be happy to see her sooner.  As previously documented her carotid subclavian bypass on the left is occluded that was previously placed by Dr. 78.  She is having no left upper extremity symptoms and has a palpable radial pulse at the wrist.  No indication for intervention with no symptoms.     Arbie Cookey, MD [facility] Office: [phone]    [phone]

## 2021-03-06 ENCOUNTER — Other Ambulatory Visit: Payer: Self-pay | Admitting: Family Medicine

## 2021-03-06 NOTE — Telephone Encounter (Signed)
Last date fill 09/18/20 Last ov 08/14/20

## 2021-05-24 ENCOUNTER — Other Ambulatory Visit: Payer: Self-pay

## 2021-05-24 DIAGNOSIS — I771 Stricture of artery: Secondary | ICD-10-CM

## 2021-06-03 NOTE — Progress Notes (Signed)
Cardiology Office Note   Date:  06/04/2021   ID:  [name], DOB 15-Sep-1950, MRN [id number]  PCP:  Valentino Nose, NP  Cardiologist:   Rollene Rotunda, MD    Chief Complaint  Patient presents with   Cardiomyopathy   Coronary Artery Disease      History of Present Illness: [name] is a 70 y.o. female who presents ffor evaluation of known coronary disease. She has a history of CABG in 1996. 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   I sent her for a POET (Plain Old Exercise Treadmill) 2016,  This was negative for any evidence of ischemia. However, she had a hypertensive blood pressure response.   She presented 05/12/17 with an anterior STEMI after she developed chest pain at work and called EMS. Cath revealed high grade LAD disease after the LIMA insertion and proximal RI disease. She underwent PCI with DES to both sites. Troponin peaked at 2.76.     Since I last saw her she has done okay from a cardiovascular standpoint.  She had some questions about peripheral vascular disease.  She has seen vascular surgery and is followed because she does have left subclavian stenosis.  She is had bypass years ago but it is noted to be occluded.  She does have a good left radial pulse however and has no symptoms.  She has no obstructive disease in her carotids.  She was worried about this and wondered if this obstruction was going to cause a heart attack.  Since we last saw her she has had no new cardiovascular complaints otherwise.  She rides a stationary bicycle.  She climbs ladders.  She denies any cardiovascular symptoms. The patient denies any new symptoms such as chest discomfort, neck or arm discomfort. There has been no new shortness of breath, PND or orthopnea. There have been no reported palpitations, presyncope or syncope.    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, jaw (HCC)    Peripheral vascular disease (HCC)    carotid to subclavian artery bypass graft   Smoker     Past Surgical History:  Procedure Laterality Date   CAROTID ARTERY - SUBCLAVIAN ARTERY BYPASS GRAFT Left 04/18/2007   COLONOSCOPY  2008   CORONARY ARTERY BYPASS GRAFT     CORONARY/GRAFT ACUTE MI REVASCULARIZATION N/A 05/12/2017   Procedure: Coronary/Graft Acute MI Revascularization;  Surgeon: Tonny Bollman, MD;  Location[facility] INVASIVE CV LAB;  Service: Cardiovascular;  Laterality: N/A;   DEBRIDEMENT MANDIBLE Left 03/23/2019   Procedure: DEBRIDEMENT LEFT MANDIBLE,SEQUESTRECTOMY;  Surgeon: Vivia Ewing, DMD;  Location: [facility] OR;  Service: Oral Surgery;  Laterality: Left;   KIDNEY SURGERY Left    70 yo   LEFT HEART CATH AND CORONARY ANGIOGRAPHY N/A 05/12/2017   Procedure: LEFT HEART CATH AND CORONARY ANGIOGRAPHY;  Surgeon: Tonny Bollman, MD;  [facility] INVASIVE CV LAB;  Service: Cardiovascular;  Laterality: N/A;   Left Nephrectomy Left 10/17/1983   Dr. Beatrice Lecher,  Left Kidney Nonfunctioning wiht Acute and Chronic Pyelonephritis   WISDOM TOOTH EXTRACTION       Current Outpatient Medications  Medication Sig Dispense Refill   albuterol (VENTOLIN HFA) 108 (90 Base) MCG/ACT inhaler Inhale 1-2 puffs into the lungs every 6 (six) hours as needed for wheezing or shortness of breath. 18 g 2   ALPRAZolam (XANAX) 0.25 MG tablet TAKE 1 TABLET BY MOUTH EVERY DAY 30 tablet 0   amLODipine (NORVASC) 10 MG tablet TAKE 1 TABLET BY MOUTH EVERY DAY (Patient taking differently: Take 10 mg by mouth daily.) 90 tablet 3   aspirin  81 MG tablet Take 81 mg by mouth daily.     carvedilol (COREG) 3.125 MG tablet TAKE 1 TABLET (3.125 MG TOTAL) BY MOUTH 2 (TWO) TIMES DAILY WITH A MEAL. 60 tablet 1   ezetimibe (ZETIA) 10 MG tablet TAKE 1 TABLET (10 MG TOTAL) BY MOUTH DAILY. FOR CHOLESTEROL 90 tablet 0   lisinopril (ZESTRIL) 20 MG tablet TAKE 1 TABLET BY MOUTH EVERY DAY 90 tablet 1   nitroGLYCERIN (NITROSTAT) 0.4 MG SL tablet PLACE 1 TABLET ON THE TONGUE &LET DISSOLVE EVERY 5 MINS AS NEEDED FOR CHEST PAIN 25 tablet 1   omeprazole (PRILOSEC) 10 MG capsule TAKE 1 CAPSULE (10 MG TOTAL) BY MOUTH DAILY AS NEEDED. 90 capsule 1   rosuvastatin (CRESTOR) 40 MG tablet Take 1 tablet (40 mg total) by mouth daily. NEED APPOINTMENT 15 tablet 0   triamcinolone cream (KENALOG) 0.1 % Apply 1 application topically 2 (two) times daily. 30 g 2   vitamin B-12 (CYANOCOBALAMIN) 100 MCG tablet Take 100 mcg by mouth daily.     vitamin E 100 UNIT capsule Take 100 Units by mouth daily.      No current facility-administered medications for this visit.    Allergies:   Bisphosphonates    ROS:  Please see the history of present illness.   Otherwise, review of systems are positive for none.   All other systems are reviewed and negative.    PHYSICAL EXAM: VS:  BP (!) 172/98    Pulse 79    Ht 5\' 7"  (1.702 m)    Wt 101 lb 12.8 oz (46.2 kg)    LMP  (LMP Unknown)    SpO2 94%    BMI 15.94 kg/m  , BMI Body mass index is 15.94 kg/m. GENERAL:  Well appearing, very thin NECK:  No jugular venous distention, waveform within normal limits, carotid upstroke brisk and symmetric, no bruits, no thyromegaly LUNGS:  Clear to auscultation bilaterally CHEST:  Well healed sternotomy scar. HEART:  PMI not displaced or sustained,S1 and S2 within normal limits, no S3, no S4, no clicks, no rubs, no murmurs ABD:  Flat, positive bowel sounds normal in frequency in pitch, no bruits, no rebound, no guarding, no midline pulsatile mass, no hepatomegaly, no splenomegaly EXT:  2 plus  pulses upper and diminished dorsalis pedis and posterior tibialis bilateral, no edema, no cyanosis no clubbing  EKG:  EKG is ordered today. The ekg ordered today demonstrates sinus rhythm, rate 79, axis within normal limits, intervals within normal limits, no acute ST-T wave changes.   Recent Labs: 02/02/2021: BUN 24; Creatinine, Ser 1.32; Hemoglobin 14.5; Platelets 264; Potassium 4.3; Sodium 139    Lipid Panel    Component Value Date/Time   CHOL 191 04/10/2020 1447   TRIG 68 04/10/2020 1447   HDL 73 04/10/2020 1447   CHOLHDL 2.6 04/10/2020 1447   VLDL 26 05/13/2017 0009  LDLCALC 103 (H) 04/10/2020 1447      Wt Readings from Last 3 Encounters:  06/04/21 101 lb 12.8 oz (46.2 kg)  02/13/21 102 lb (46.3 kg)  07/25/20 104 lb (47.2 kg)      Other studies Reviewed: Additional studies/ records that were reviewed today include: [facility] records and labs. Review of the above records demonstrates:  Please see elsewhere in the note.     ASSESSMENT AND PLAN:  CAD:   The patient has no new sypmtoms.  No further cardiovascular testing is indicated.  We will continue with aggressive risk reduction and meds as listed.   TOBACCO:      We again talked about the need to stop smoking and she is unable to do this.  DYSLIPIDEMIA:   I do not see a recent fasting lipid.  I will ask her to get this done and the goal should be an LDL less than 70 at least.  50 would be ideal.   CARDIOMYOPATHY:   She has had a mildly reduced ejection fraction and I will repeat an echocardiogram.   I would titrate her meds if her ejection fraction is any lower.  PVD: I went over this anatomy with her in detail.   HTN:     This is being managed in the context of treating his CHF.  Her blood pressure is elevated today.  She is going to keep a blood pressure diary and return his results to me in I will pursue further med titration.   SOLITARY KIDNEY: Her creatinine was 1.11 in September.  She is going to establish with a  nephrologist   Current medicines are reviewed at length with the patient today.  The patient does not have concerns regarding medicines.  The following changes have been made:  no change  Labs/ tests ordered today include: None  Orders Placed This Encounter  Procedures   Lipid panel   EKG 12-Lead   ECHOCARDIOGRAM COMPLETE     Disposition:   FU with me in one year.     Signed, Rollene Rotunda, MD  06/04/2021 12:20 PM    [facility]

## 2021-06-04 ENCOUNTER — Ambulatory Visit: Payer: 59 | Admitting: Cardiology

## 2021-06-04 ENCOUNTER — Encounter: Payer: Self-pay | Admitting: Cardiology

## 2021-06-04 ENCOUNTER — Other Ambulatory Visit: Payer: Self-pay

## 2021-06-04 VITALS — BP 172/98 | HR 79 | Ht 67.0 in | Wt 101.8 lb

## 2021-06-04 DIAGNOSIS — I2510 Atherosclerotic heart disease of native coronary artery without angina pectoris: Secondary | ICD-10-CM | POA: Diagnosis not present

## 2021-06-04 DIAGNOSIS — I255 Ischemic cardiomyopathy: Secondary | ICD-10-CM

## 2021-06-04 DIAGNOSIS — E785 Hyperlipidemia, unspecified: Secondary | ICD-10-CM

## 2021-06-04 DIAGNOSIS — Z720 Tobacco use: Secondary | ICD-10-CM

## 2021-06-04 NOTE — Patient Instructions (Addendum)
Medication Instructions:  Your Physician recommend you continue on your current medication as directed.    *If you need a refill on your cardiac medications before your next appointment, please call your pharmacy*  Lab work: Return for fasting lipid panel.  Testing/Procedures: Your physician has requested that you have an echocardiogram. Echocardiography is a painless test that uses sound waves to create images of your heart. It provides your doctor with information about the size and shape of your heart and how well your hearts chambers and valves are working. This procedure takes approximately one hour. There are no restrictions for this procedure.  Follow-Up: At [facility], you and your health needs are our priority.  As part of our continuing mission to provide you with exceptional heart care, we have created designated Provider Care Teams.  These Care Teams include your primary Cardiologist (physician) and Advanced Practice Providers (APPs -  Physician Assistants and Nurse Practitioners) who all work together to provide you with the care you need, when you need it.  We recommend signing up for the patient portal called "MyChart".  Sign up information is provided on this After Visit Summary.  MyChart is used to connect with patients for Virtual Visits (Telemedicine).  Patients are able to view lab/test results, encounter notes, upcoming appointments, etc.  Non-urgent messages can be sent to your provider as well.   To learn more about what you can do with MyChart, go to [email or web address].    Your next appointment:   1 year(s)  The format for your next appointment:   In Person  Provider:   Rollene Rotunda, MD   Keep a blood pressure diary (twice a day) and return readings to Dr. Jenene Slicker office.

## 2021-06-05 LAB — LIPID PANEL
Chol/HDL Ratio: 2.2 ratio (ref 0.0–4.4)
Cholesterol, Total: 135 mg/dL (ref 100–199)
HDL: 62 mg/dL (ref >39)
LDL Chol Calc (NIH): 60 mg/dL (ref 0–99)
Triglycerides: 64 mg/dL (ref 0–149)
VLDL Cholesterol Cal: 13 mg/dL (ref 5–40)

## 2021-06-07 ENCOUNTER — Encounter: Payer: Self-pay | Admitting: *Deleted

## 2021-06-12 ENCOUNTER — Ambulatory Visit: Payer: 59 | Admitting: Vascular Surgery

## 2021-06-12 ENCOUNTER — Ambulatory Visit (HOSPITAL_COMMUNITY): Payer: 59 | Attending: Vascular Surgery

## 2021-06-22 ENCOUNTER — Other Ambulatory Visit (HOSPITAL_COMMUNITY): Payer: 59

## 2021-06-28 ENCOUNTER — Other Ambulatory Visit (HOSPITAL_COMMUNITY): Payer: 59

## 2021-07-05 ENCOUNTER — Other Ambulatory Visit: Payer: Self-pay | Admitting: Internal Medicine

## 2021-07-05 DIAGNOSIS — M810 Age-related osteoporosis without current pathological fracture: Secondary | ICD-10-CM

## 2021-07-13 ENCOUNTER — Institutional Professional Consult (permissible substitution): Payer: 59 | Admitting: Pulmonary Disease

## 2021-07-16 ENCOUNTER — Other Ambulatory Visit: Payer: Self-pay | Admitting: Internal Medicine

## 2021-07-16 DIAGNOSIS — Z1231 Encounter for screening mammogram for malignant neoplasm of breast: Secondary | ICD-10-CM

## 2021-07-20 ENCOUNTER — Other Ambulatory Visit (HOSPITAL_COMMUNITY): Payer: 59

## 2021-07-31 ENCOUNTER — Telehealth: Payer: Self-pay | Admitting: Cardiology

## 2021-07-31 ENCOUNTER — Ambulatory Visit: Payer: 59 | Admitting: Pulmonary Disease

## 2021-07-31 ENCOUNTER — Other Ambulatory Visit: Payer: Self-pay

## 2021-07-31 ENCOUNTER — Encounter: Payer: Self-pay | Admitting: Pulmonary Disease

## 2021-07-31 VITALS — BP 116/78 | HR 76 | Ht 67.0 in | Wt 101.6 lb

## 2021-07-31 DIAGNOSIS — R053 Chronic cough: Secondary | ICD-10-CM

## 2021-07-31 DIAGNOSIS — R0602 Shortness of breath: Secondary | ICD-10-CM | POA: Diagnosis not present

## 2021-07-31 DIAGNOSIS — F17210 Nicotine dependence, cigarettes, uncomplicated: Secondary | ICD-10-CM

## 2021-07-31 DIAGNOSIS — J449 Chronic obstructive pulmonary disease, unspecified: Secondary | ICD-10-CM | POA: Diagnosis not present

## 2021-07-31 MED ORDER — ROSUVASTATIN CALCIUM 40 MG PO TABS: 40.0000 mg | ORAL_TABLET | Freq: Every day | ORAL | 3 refills | Status: DC

## 2021-07-31 MED ORDER — AMLODIPINE BESYLATE 10 MG PO TABS: 10.0000 mg | ORAL_TABLET | Freq: Every day | ORAL | 3 refills | Status: DC

## 2021-07-31 MED ORDER — MONTELUKAST SODIUM 10 MG PO TABS: 10.0000 mg | ORAL_TABLET | Freq: Every day | ORAL | 11 refills | Status: DC

## 2021-07-31 MED ORDER — SPIRIVA RESPIMAT 2.5 MCG/ACT IN AERS: 2.0000 | INHALATION_SPRAY | Freq: Every day | RESPIRATORY_TRACT | 0 refills | Status: DC

## 2021-07-31 MED ORDER — CARVEDILOL 3.125 MG PO TABS: 3.1250 mg | ORAL_TABLET | Freq: Two times a day (BID) | ORAL | 3 refills | Status: DC

## 2021-07-31 MED ORDER — EZETIMIBE 10 MG PO TABS: 10.0000 mg | ORAL_TABLET | Freq: Every day | ORAL | 3 refills | Status: DC

## 2021-07-31 MED ORDER — LISINOPRIL 20 MG PO TABS: 20.0000 mg | ORAL_TABLET | Freq: Every day | ORAL | 3 refills | Status: DC

## 2021-07-31 NOTE — Progress Notes (Signed)
Patient seen in the office today and instructed on use of Spiriva 2.61mcg.  Patient expressed understanding and demonstrated technique.  Benetta Spar CMA

## 2021-07-31 NOTE — Progress Notes (Signed)
Synopsis: Referred in February 2023 for COPD by Dorinda Hill, MD  Subjective:   PATIENT ID: [name] GENDER: female DOB: [birth year], MRN: [id number]   HPI  Chief Complaint  Patient presents with   Consult    Referred by PCP for COPD. States she has had a cough for the past 2 months.    [name] is a 71 year old woman, daily smoker with coronary artery disease, hypertension and peripheral vascular disease who is referred to pulmonary clinic for COPD.   She has been experiencing increased cough and shortness of breath over recent months. The cough is occasionally productive in the mornings and is worse at night. She is waking up 3-4 times per week due to cough. She denies wheezing. She has sinus congestion and drainage along with seasonal allergies. Strong perfumes tend to bother her breathing.   She works for [employer] and has been there for 30 years. She is active at work Therapist, music. She is smoking quarter to half a pack per day. She has been smoking for 20+ years and was previously smoking just under 1 pack per day. She was exposed to second hand smoke in childhood.   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, jaw (HCC)    Peripheral vascular disease (HCC)    carotid to subclavian artery bypass graft   Smoker      Family History  Problem Relation Age of Onset   Depression Sister    COPD Father    Cancer Sister        hysterectomy sec to cancer   Cancer Sister        cervical cancer   COPD Brother      Social History    Socioeconomic History   Marital status: Married    Spouse name: Not on file   Number of children: 2   Years of education: Not on file   Highest education level: Not on file  Occupational History   Not on file  Tobacco Use   Smoking status: Every Day    Packs/day: 0.25    Years: 40.00    Pack years: 10.00    Types: Cigarettes   Smokeless tobacco: Never  Vaping Use   Vaping Use: Never used  Substance and Sexual Activity   Alcohol use: Yes    Comment: Occasional Beer   Drug use: No   Sexual activity: Yes    Birth control/protection: Post-menopausal  Other Topics Concern   Not on file  Social History Narrative   Lives alone.     Social Determinants of Health   Financial Resource Strain: Not on file  Food Insecurity: Not on file  Transportation Needs: Not on file  Physical Activity: Not on file  Stress: Not on file  Social Connections: Not on file  Intimate Partner Violence: Not on file     Allergies  Allergen Reactions   Bisphosphonates Other (See Comments)    Osteonecrosis Jaw      Outpatient Medications Prior to  Visit  Medication Sig Dispense Refill   albuterol (VENTOLIN HFA) 108 (90 Base) MCG/ACT inhaler Inhale 1-2 puffs into the lungs every 6 (six) hours as needed for wheezing or shortness of breath. 18 g 2   ALPRAZolam (XANAX) 0.25 MG tablet TAKE 1 TABLET BY MOUTH EVERY DAY 30 tablet 0   aspirin 81 MG tablet Take 81 mg by mouth daily.     nitroGLYCERIN (NITROSTAT) 0.4 MG SL tablet PLACE 1 TABLET ON THE TONGUE &LET DISSOLVE EVERY 5 MINS AS NEEDED FOR CHEST PAIN 25 tablet 1   omeprazole (PRILOSEC) 10 MG capsule TAKE 1 CAPSULE (10 MG TOTAL) BY MOUTH DAILY AS NEEDED. 90 capsule 1   triamcinolone cream (KENALOG) 0.1 % Apply 1 application topically 2 (two) times daily. 30 g 2   vitamin B-12 (CYANOCOBALAMIN) 100 MCG tablet Take 100 mcg by mouth daily.     vitamin E 100 UNIT capsule Take 100 Units by mouth daily.      amLODipine (NORVASC) 10 MG tablet TAKE 1 TABLET  BY MOUTH EVERY DAY (Patient taking differently: Take 10 mg by mouth daily.) 90 tablet 3   carvedilol (COREG) 3.125 MG tablet TAKE 1 TABLET (3.125 MG TOTAL) BY MOUTH 2 (TWO) TIMES DAILY WITH A MEAL. 60 tablet 1   ezetimibe (ZETIA) 10 MG tablet TAKE 1 TABLET (10 MG TOTAL) BY MOUTH DAILY. FOR CHOLESTEROL 90 tablet 0   lisinopril (ZESTRIL) 20 MG tablet TAKE 1 TABLET BY MOUTH EVERY DAY 90 tablet 1   rosuvastatin (CRESTOR) 40 MG tablet Take 1 tablet (40 mg total) by mouth daily. NEED APPOINTMENT 15 tablet 0   No facility-administered medications prior to visit.   Review of Systems  Constitutional:  Negative for chills, fever, malaise/fatigue and weight loss.  HENT:  Negative for congestion, sinus pain and sore throat.   Eyes: Negative.   Respiratory:  Positive for shortness of breath. Negative for cough, hemoptysis, sputum production and wheezing.   Cardiovascular:  Negative for chest pain, palpitations, orthopnea, claudication and leg swelling.  Gastrointestinal:  Positive for heartburn. Negative for abdominal pain, nausea and vomiting.  Genitourinary: Negative.   Musculoskeletal:  Negative for joint pain and myalgias.  Skin:  Negative for rash.  Neurological:  Positive for headaches. Negative for weakness.  Endo/Heme/Allergies:  Positive for environmental allergies.  Psychiatric/Behavioral:  Positive for depression.    Objective:   Vitals:   07/31/21 1331  BP: 116/78  Pulse: 76  SpO2: 97%  Weight: 101 lb 9.6 oz (46.1 kg)  Height: 5\' 7"  (1.702 m)   Physical Exam Constitutional:      General: She is not in acute distress.    Appearance: She is not ill-appearing.  HENT:     Head: Normocephalic and atraumatic.  Eyes:     General: No scleral icterus.    Conjunctiva/sclera: Conjunctivae normal.     Pupils: Pupils are equal, round, and reactive to light.  Cardiovascular:     Rate and Rhythm: Normal rate and regular rhythm.     Pulses: Normal pulses.     Heart sounds: Normal heart  sounds. No murmur heard. Pulmonary:     Effort: Pulmonary effort is normal.     Breath sounds: Normal breath sounds. No wheezing, rhonchi or rales.  Abdominal:     General: Bowel sounds are normal.     Palpations: Abdomen is soft.  Musculoskeletal:     Right lower leg: No edema.     Left lower leg: No edema.  Lymphadenopathy:  Cervical: No cervical adenopathy.  Skin:    General: Skin is warm and dry.  Neurological:     General: No focal deficit present.     Mental Status: She is alert.  Psychiatric:        Mood and Affect: Mood normal.        Behavior: Behavior normal.        Thought Content: Thought content normal.        Judgment: Judgment normal.   CBC    Component Value Date/Time   WBC 6.8 02/02/2021 1250   RBC 4.63 02/02/2021 1250   HGB 14.5 02/02/2021 1250   HCT 44.1 02/02/2021 1250   PLT 264 02/02/2021 1250   MCV 95.2 02/02/2021 1250   MCH 31.3 02/02/2021 1250   MCHC 32.9 02/02/2021 1250   RDW 14.2 02/02/2021 1250   LYMPHSABS 1,809 04/10/2020 1447   MONOABS 0.6 07/23/2017 0737   EOSABS 84 04/10/2020 1447   BASOSABS 68 04/10/2020 1447   BMP Latest Ref Rng & Units 02/02/2021 04/14/2020 04/10/2020  Glucose 70 - 99 mg/dL 409(W105(H) 90 88  BUN 8 - 23 mg/dL 11(B24(H) 23 25  Creatinine 0.44 - 1.00 mg/dL 1.47(W1.32(H) 2.95(A1.01(H) 2.13(Y1.04(H)  BUN/Creat Ratio 6 - 22 (calc) - 23(H) 24(H)  Sodium 135 - 145 mmol/L 139 141 139  Potassium 3.5 - 5.1 mmol/L 4.3 4.8 5.5(H)  Chloride 98 - 111 mmol/L 106 109 106  CO2 22 - 32 mmol/L 26 25 25   Calcium 8.9 - 10.3 mg/dL 86.510.0 78.410.3 10.5(H)   Chest imaging: CXR 02/02/21 The heart size and mediastinal contours are within normal limits. Aortic atherosclerotic calcification noted. Prior median sternotomy noted.   Pulmonary hyperinflation again seen, consistent with COPD. Both lungs are clear. Surgical clips again noted in the left upper quadrant. The visualized skeletal structures are unremarkable.  PFT: No flowsheet data  found.  Labs:  Path:  Echo:  Heart Catheterization:  Assessment & Plan:   Chronic obstructive pulmonary disease, unspecified COPD type (HCC) - Plan: montelukast (SINGULAIR) 10 MG tablet  Chronic cough  Shortness of breath  Cigarette smoker - Plan: Ambulatory Referral for Lung Cancer Scre  Discussion: [name] is a 71 year old woman, daily smoker with coronary artery disease, hypertension and peripheral vascular disease who is referred to pulmonary clinic for COPD.   She has hyperinflation on recent chest radiograph concerning for obstructive lung disease. She is to start spiriva 2.745mcg 2 puffs daily and continue as needed albuterol use. We will start her on singulair 10mg  daily for her allergies that affect her breathing.   Given her smoking history, we will refer her to our lung cancer screening program.   She is to follow up in 4-6 weeks for pulmonary function testing.  Melody ComasJonathan Jacarius Handel, MD [facility] Office: [phone]   Current Outpatient Medications:    albuterol (VENTOLIN HFA) 108 (90 Base) MCG/ACT inhaler, Inhale 1-2 puffs into the lungs every 6 (six) hours as needed for wheezing or shortness of breath., Disp: 18 g, Rfl: 2   ALPRAZolam (XANAX) 0.25 MG tablet, TAKE 1 TABLET BY MOUTH EVERY DAY, Disp: 30 tablet, Rfl: 0   aspirin 81 MG tablet, Take 81 mg by mouth daily., Disp: , Rfl:    montelukast (SINGULAIR) 10 MG tablet, Take 1 tablet (10 mg total) by mouth at bedtime., Disp: 30 tablet, Rfl: 11   nitroGLYCERIN (NITROSTAT) 0.4 MG SL tablet, PLACE 1 TABLET ON THE TONGUE &LET DISSOLVE EVERY 5 MINS AS NEEDED FOR  CHEST PAIN, Disp: 25 tablet, Rfl: 1   omeprazole (PRILOSEC) 10 MG capsule, TAKE 1 CAPSULE (10 MG TOTAL) BY MOUTH DAILY AS NEEDED., Disp: 90 capsule, Rfl: 1   Tiotropium Bromide Monohydrate (SPIRIVA RESPIMAT) 2.5 MCG/ACT AERS, Inhale 2 puffs into the lungs daily., Disp: 8 g, Rfl: 0   triamcinolone cream (KENALOG) 0.1 %, Apply 1  application topically 2 (two) times daily., Disp: 30 g, Rfl: 2   vitamin B-12 (CYANOCOBALAMIN) 100 MCG tablet, Take 100 mcg by mouth daily., Disp: , Rfl:    vitamin E 100 UNIT capsule, Take 100 Units by mouth daily. , Disp: , Rfl:    amLODipine (NORVASC) 10 MG tablet, Take 1 tablet (10 mg total) by mouth daily., Disp: 90 tablet, Rfl: 3   carvedilol (COREG) 3.125 MG tablet, Take 1 tablet (3.125 mg total) by mouth 2 (two) times daily with a meal., Disp: 180 tablet, Rfl: 3   ezetimibe (ZETIA) 10 MG tablet, Take 1 tablet (10 mg total) by mouth daily. For cholesterol, Disp: 90 tablet, Rfl: 3   lisinopril (ZESTRIL) 20 MG tablet, Take 1 tablet (20 mg total) by mouth daily., Disp: 90 tablet, Rfl: 3   rosuvastatin (CRESTOR) 40 MG tablet, Take 1 tablet (40 mg total) by mouth daily. NEED APPOINTMENT, Disp: 90 tablet, Rfl: 3

## 2021-07-31 NOTE — Telephone Encounter (Signed)
Refills has been sent to the pharmacy. 

## 2021-07-31 NOTE — Patient Instructions (Addendum)
Start spiriva 2.69mcg 2 puffs daily  Continue to use albuterol inhaler as needed 1-2 puffs every 4-6 hours  Start singulair 10mg  daily for allergies  We will refer you to our lung cancer screening program  We will check pulmonary function tests in 4-6 weeks with follow up visit

## 2021-07-31 NOTE — Telephone Encounter (Signed)
°*  STAT* If patient is at the pharmacy, call can be transferred to refill team.   1. Which medications need to be refilled? (please list name of each medication and dose if known)  rosuvastatin (CRESTOR) 40 MG tablet ezetimibe (ZETIA) 10 MG tablet carvedilol (COREG) 3.125 MG tablet amLODipine (NORVASC) 10 MG tablet lisinopril (ZESTRIL) 20 MG tablet  2. Which pharmacy/location (including street and city if local pharmacy) is medication to be sent to? [id number] [place], [place] - [address]  3. Do they need a 30 day or 90 day supply? 90 day

## 2021-08-07 ENCOUNTER — Encounter: Payer: Self-pay | Admitting: Pulmonary Disease

## 2021-08-13 ENCOUNTER — Encounter (HOSPITAL_COMMUNITY): Payer: Self-pay | Admitting: Cardiology

## 2021-08-13 ENCOUNTER — Ambulatory Visit (HOSPITAL_COMMUNITY): Payer: 59 | Attending: Cardiology

## 2021-11-05 ENCOUNTER — Telehealth: Payer: Self-pay | Admitting: Cardiology

## 2021-11-05 NOTE — Telephone Encounter (Signed)
Returned call to pt she states that she recently saw a new kidney doctor and she states that her kidney doctor said that her kidney was fine. He told her that "she needs to be more concerned about her BP and cholesterol".  Informed pt that before answering we would really need the lab that he had drawn to make informed decision. He sent in an rx for Lipitor 40mg . She will have them fax over lab results.  Verbalized understanding.   She was just wondering if she needs to take this medication. Please advise

## 2021-11-05 NOTE — Telephone Encounter (Signed)
Pt c/o medication issue:  1. Name of Medication:  ezetimibe (ZETIA) 10 MG tablet Lipitor 2. How are you currently taking this medication (dosage and times per day)? As prescribed   3. Are you having a reaction (difficulty breathing--STAT)? No  4. What is your medication issue? Pt states that her kidney doctor has increased dosage to her ezetimibe medication due to it being high at her appt and then added another medication for Lipitor. Pt is not certain this is okay and is requesting call back to make sure this med change is okay with cardiologist.

## 2021-11-06 NOTE — Telephone Encounter (Signed)
See faxed lab results in EPIC under media.

## 2021-11-06 NOTE — Telephone Encounter (Signed)
   Pt is calling back to f/u. She said per her kidney doctor, they faxed her lab result yesterday. She wanted to know if Dr. Percival Spanish need to adjust her meds

## 2021-11-07 NOTE — Telephone Encounter (Signed)
Called pt's pharmacy. Spoke with Wells Guiles, she states pt picked up her medication on 5/17. When I asked if she received 10 mg or 40 mg Crestor she states it was 40 mg.   Called pt. She is confused about which medications she needs to take. She states she is not going to take Lipitor until we tell her to take it. "I have been taking it but I will stop." Pt states she has not been taking Crestor. Pt wants to have her heart medications only filled by this office. The labs in the pt's chart does not include Lipids. She will come tomorrow to get her labs drawn.

## 2021-11-07 NOTE — Telephone Encounter (Signed)
Patient states she can come in tomorrow for the lab work.  She said when the pharmacy filled her medication they only filled it for 10mg , they told her they couldn't fill it for 40mg  since they had just filled it for 10mg .  So she has only been taking 10mg  of rosuvastatin.

## 2021-11-07 NOTE — Telephone Encounter (Signed)
LM2CB-Pt needs to come in for fasting lab-kidney MD does not do cholesterol lab. Is pt still taking rosuvastatin 40mg  as well?

## 2021-11-08 ENCOUNTER — Other Ambulatory Visit: Payer: Self-pay

## 2021-11-08 DIAGNOSIS — Z79899 Other long term (current) drug therapy: Secondary | ICD-10-CM

## 2021-11-08 DIAGNOSIS — E785 Hyperlipidemia, unspecified: Secondary | ICD-10-CM

## 2021-11-08 NOTE — Progress Notes (Signed)
Fasting lipid see mychart message dated 11-05-21

## 2021-11-09 LAB — LIPID PANEL
Chol/HDL Ratio: 2.2 ratio (ref 0.0–4.4)
Cholesterol, Total: 127 mg/dL (ref 100–199)
HDL: 58 mg/dL (ref >39)
LDL Chol Calc (NIH): 54 mg/dL (ref 0–99)
Triglycerides: 75 mg/dL (ref 0–149)
VLDL Cholesterol Cal: 15 mg/dL (ref 5–40)

## 2021-11-13 NOTE — Telephone Encounter (Signed)
Returned call to pt, she states that she is taking Crestor 40mg . She will continue this. She states that she will call back and schedule her ECHO as she is unable to do so now.

## 2021-12-03 ENCOUNTER — Ambulatory Visit: Payer: 59

## 2021-12-03 ENCOUNTER — Other Ambulatory Visit: Payer: 59

## 2021-12-26 ENCOUNTER — Other Ambulatory Visit: Payer: Self-pay | Admitting: *Deleted

## 2021-12-26 DIAGNOSIS — J449 Chronic obstructive pulmonary disease, unspecified: Secondary | ICD-10-CM

## 2021-12-26 DIAGNOSIS — R053 Chronic cough: Secondary | ICD-10-CM

## 2021-12-28 ENCOUNTER — Ambulatory Visit
Admission: RE | Admit: 2021-12-28 | Discharge: 2021-12-28 | Disposition: A | Discharge status: Home / Self Care | Payer: 59 | Source: Ambulatory Visit | Attending: Internal Medicine | Admitting: Internal Medicine

## 2021-12-28 ENCOUNTER — Ambulatory Visit: Payer: 59 | Admitting: Pulmonary Disease

## 2021-12-28 DIAGNOSIS — Z1231 Encounter for screening mammogram for malignant neoplasm of breast: Secondary | ICD-10-CM

## 2022-03-12 ENCOUNTER — Ambulatory Visit: Payer: 59 | Admitting: Pulmonary Disease

## 2022-05-02 ENCOUNTER — Encounter: Payer: Self-pay | Admitting: Pulmonary Disease

## 2022-05-02 ENCOUNTER — Ambulatory Visit (INDEPENDENT_AMBULATORY_CARE_PROVIDER_SITE_OTHER): Payer: 59 | Admitting: Pulmonary Disease

## 2022-05-02 ENCOUNTER — Ambulatory Visit: Payer: 59 | Admitting: Pulmonary Disease

## 2022-05-02 VITALS — BP 116/70 | HR 91 | Ht 66.0 in | Wt 101.0 lb

## 2022-05-02 DIAGNOSIS — J449 Chronic obstructive pulmonary disease, unspecified: Secondary | ICD-10-CM | POA: Diagnosis not present

## 2022-05-02 DIAGNOSIS — F17210 Nicotine dependence, cigarettes, uncomplicated: Secondary | ICD-10-CM

## 2022-05-02 DIAGNOSIS — R053 Chronic cough: Secondary | ICD-10-CM

## 2022-05-02 LAB — PULMONARY FUNCTION TEST
DL/VA % pred: 83 %
DL/VA: 3.41 ml/min/mmHg/L
DLCO cor % pred: 58 %
DLCO cor: 12.12 ml/min/mmHg
DLCO unc % pred: 58 %
DLCO unc: 12.12 ml/min/mmHg
FEF 25-75 Post: 0.61 L/sec
FEF 25-75 Pre: 0.48 L/sec
FEF2575-%Change-Post: 27 %
FEF2575-%Pred-Post: 30 %
FEF2575-%Pred-Pre: 23 %
FEV1-%Change-Post: -4 %
FEV1-%Pred-Post: 37 %
FEV1-%Pred-Pre: 39 %
FEV1-Post: 0.92 L
FEV1-Pre: 0.97 L
FEV1FVC-%Change-Post: -18 %
FEV1FVC-%Pred-Pre: 71 %
FEV6-%Change-Post: 15 %
FEV6-%Pred-Post: 65 %
FEV6-%Pred-Pre: 56 %
FEV6-Post: 2.03 L
FEV6-Pre: 1.76 L
FEV6FVC-%Change-Post: -1 %
FEV6FVC-%Pred-Post: 101 %
FEV6FVC-%Pred-Pre: 102 %
FVC-%Change-Post: 16 %
FVC-%Pred-Post: 64 %
FVC-%Pred-Pre: 55 %
FVC-Post: 2.09 L
FVC-Pre: 1.79 L
Post FEV1/FVC ratio: 44 %
Post FEV6/FVC ratio: 97 %
Pre FEV1/FVC ratio: 54 %
Pre FEV6/FVC Ratio: 98 %
RV % pred: 161 %
RV: 3.69 L
TLC % pred: 106 %
TLC: 5.68 L

## 2022-05-02 MED ORDER — TRELEGY ELLIPTA 100-62.5-25 MCG/ACT IN AEPB: 1.0000 | INHALATION_SPRAY | Freq: Every day | RESPIRATORY_TRACT | 11 refills | Status: DC

## 2022-05-02 MED ORDER — TRELEGY ELLIPTA 100-62.5-25 MCG/ACT IN AEPB: 1.0000 | INHALATION_SPRAY | Freq: Every day | RESPIRATORY_TRACT | 0 refills | Status: DC

## 2022-05-02 NOTE — Patient Instructions (Signed)
Full PFT Performed Today  

## 2022-05-02 NOTE — Progress Notes (Signed)
Full PFT Performed Today  

## 2022-05-02 NOTE — Progress Notes (Signed)
Synopsis: Referred in February 2023 for COPD by Dorinda Hill, MD  Subjective:   PATIENT ID: [name] GENDER: female DOB: 08-14-1950, MRN: [id number]   HPI  Chief Complaint  Patient presents with   Follow-up    F/U after PFT. States her breathing has been stable since last visit. Denies any new concerns.    [name] is a 71 year old woman, daily smoker with coronary artery disease, hypertension and peripheral vascular disease who returns to pulmonary clinic for COPD.   She was started on spiriva and montelukast at last visit. She has noticed great benefit. She is using albuterol about 1 time per day.   Initial OV 07/31/21 She has been experiencing increased cough and shortness of breath over recent months. The cough is occasionally productive in the mornings and is worse at night. She is waking up 3-4 times per week due to cough. She denies wheezing. She has sinus congestion and drainage along with seasonal allergies. Strong perfumes tend to bother her breathing.   She works for [employer] and has been there for 30 years. She is active at work Therapist, music. She is smoking quarter to half a pack per day. She has been smoking for 20+ years and was previously smoking just under 1 pack per day. She was exposed to second hand smoke in childhood.   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, jaw (HCC)    Peripheral vascular disease (HCC)    carotid to subclavian artery bypass graft   Smoker      Family History  Problem Relation Age of Onset    Depression Sister    COPD Father    Cancer Sister        hysterectomy sec to cancer   Cancer Sister        cervical cancer   COPD Brother      Social History   Socioeconomic History   Marital status: Married    Spouse name: Not on file   Number of children: 2   Years of education: Not on file   Highest education level: Not on file  Occupational History   Not on file  Tobacco Use   Smoking status: Every Day    Packs/day: 0.25    Years: 40.00    Total pack years: 10.00    Types: Cigarettes   Smokeless tobacco: Never  Vaping Use   Vaping Use: Never used  Substance and Sexual Activity   Alcohol use: Yes    Comment: Occasional Beer   Drug use: No   Sexual activity: Yes    Birth control/protection: Post-menopausal  Other Topics Concern   Not on file  Social History Narrative   Lives alone.     Social Determinants of Health   Financial Resource Strain: Not on file  Food Insecurity: Not on file  Transportation Needs: Not on file  Physical Activity: Not on file  Stress: Not on file  Social Connections: Not on file  Intimate Partner Violence: Not  on file     Allergies  Allergen Reactions   Bisphosphonates Other (See Comments)    Osteonecrosis Jaw      Outpatient Medications Prior to Visit  Medication Sig Dispense Refill   albuterol (VENTOLIN HFA) 108 (90 Base) MCG/ACT inhaler Inhale 1-2 puffs into the lungs every 6 (six) hours as needed for wheezing or shortness of breath. 18 g 2   ALPRAZolam (XANAX) 0.25 MG tablet TAKE 1 TABLET BY MOUTH EVERY DAY 30 tablet 0   amLODipine (NORVASC) 10 MG tablet Take 1 tablet (10 mg total) by mouth daily. 90 tablet 3   aspirin 81 MG tablet Take 81 mg by mouth daily.     carvedilol (COREG) 3.125 MG tablet Take 1 tablet (3.125 mg total) by mouth 2 (two) times daily with a meal. 180 tablet 3   ezetimibe (ZETIA) 10 MG tablet Take 1 tablet (10 mg total) by mouth daily. For cholesterol 90 tablet 3   lisinopril (ZESTRIL) 20 MG tablet  Take 1 tablet (20 mg total) by mouth daily. 90 tablet 3   montelukast (SINGULAIR) 10 MG tablet Take 1 tablet (10 mg total) by mouth at bedtime. 30 tablet 11   nitroGLYCERIN (NITROSTAT) 0.4 MG SL tablet PLACE 1 TABLET ON THE TONGUE &LET DISSOLVE EVERY 5 MINS AS NEEDED FOR CHEST PAIN 25 tablet 1   omeprazole (PRILOSEC) 10 MG capsule TAKE 1 CAPSULE (10 MG TOTAL) BY MOUTH DAILY AS NEEDED. 90 capsule 1   rosuvastatin (CRESTOR) 40 MG tablet Take 1 tablet (40 mg total) by mouth daily. NEED APPOINTMENT 90 tablet 3   triamcinolone cream (KENALOG) 0.1 % Apply 1 application topically 2 (two) times daily. 30 g 2   vitamin B-12 (CYANOCOBALAMIN) 100 MCG tablet Take 100 mcg by mouth daily.     vitamin E 100 UNIT capsule Take 100 Units by mouth daily.      Tiotropium Bromide Monohydrate (SPIRIVA RESPIMAT) 2.5 MCG/ACT AERS Inhale 2 puffs into the lungs daily. 8 g 0   No facility-administered medications prior to visit.   Review of Systems  Constitutional:  Negative for chills, fever, malaise/fatigue and weight loss.  HENT:  Negative for congestion, sinus pain and sore throat.   Eyes: Negative.   Respiratory:  Negative for cough, hemoptysis, sputum production, shortness of breath and wheezing.   Cardiovascular:  Negative for chest pain, palpitations, orthopnea, claudication and leg swelling.  Gastrointestinal:  Positive for heartburn. Negative for abdominal pain, nausea and vomiting.  Genitourinary: Negative.   Musculoskeletal:  Negative for joint pain and myalgias.  Skin:  Negative for rash.  Neurological:  Negative for weakness.  Psychiatric/Behavioral:  Positive for depression.     Objective:   Vitals:   05/02/22 1112  BP: 116/70  Pulse: 91  SpO2: 98%  Weight: 101 lb (45.8 kg)  Height: 5\' 6"  (1.676 m)    Physical Exam Constitutional:      General: She is not in acute distress.    Appearance: She is not ill-appearing.  HENT:     Head: Normocephalic and atraumatic.  Eyes:     General: No  scleral icterus.    Conjunctiva/sclera: Conjunctivae normal.     Pupils: Pupils are equal, round, and reactive to light.  Cardiovascular:     Rate and Rhythm: Normal rate and regular rhythm.     Pulses: Normal pulses.     Heart sounds: Normal heart sounds. No murmur heard. Pulmonary:     Effort: Pulmonary effort is normal.  Breath sounds: Normal breath sounds. No wheezing, rhonchi or rales.  Abdominal:     General: Bowel sounds are normal.     Palpations: Abdomen is soft.  Musculoskeletal:     Right lower leg: No edema.     Left lower leg: No edema.  Lymphadenopathy:     Cervical: No cervical adenopathy.  Skin:    General: Skin is warm and dry.  Neurological:     General: No focal deficit present.     Mental Status: She is alert.  Psychiatric:        Mood and Affect: Mood normal.        Behavior: Behavior normal.        Thought Content: Thought content normal.        Judgment: Judgment normal.    CBC    Component Value Date/Time   WBC 6.8 02/02/2021 1250   RBC 4.63 02/02/2021 1250   HGB 14.5 02/02/2021 1250   HCT 44.1 02/02/2021 1250   PLT 264 02/02/2021 1250   MCV 95.2 02/02/2021 1250   MCH 31.3 02/02/2021 1250   MCHC 32.9 02/02/2021 1250   RDW 14.2 02/02/2021 1250   LYMPHSABS 1,809 04/10/2020 1447   MONOABS 0.6 07/23/2017 0737   EOSABS 84 04/10/2020 1447   BASOSABS 68 04/10/2020 1447      Latest Ref Rng & Units 02/02/2021   12:50 PM 04/14/2020    2:01 PM 04/10/2020    2:47 PM  BMP  Glucose 70 - 99 mg/dL 119105  90  88   BUN 8 - 23 mg/dL 24  23  25    Creatinine 0.44 - 1.00 mg/dL 1.471.32  8.291.01  5.621.04   BUN/Creat Ratio 6 - 22 (calc)  23  24   Sodium 135 - 145 mmol/L 139  141  139   Potassium 3.5 - 5.1 mmol/L 4.3  4.8  5.5   Chloride 98 - 111 mmol/L 106  109  106   CO2 22 - 32 mmol/L 26  25  25    Calcium 8.9 - 10.3 mg/dL 13.010.0  86.510.3  78.410.5    Chest imaging: CXR 02/02/21 The heart size and mediastinal contours are within normal limits. Aortic atherosclerotic  calcification noted. Prior median sternotomy noted.   Pulmonary hyperinflation again seen, consistent with COPD. Both lungs are clear. Surgical clips again noted in the left upper quadrant. The visualized skeletal structures are unremarkable.  PFT:    Latest Ref Rng & Units 05/02/2022   10:09 AM  PFT Results  FVC-Pre L 1.79  P  FVC-Predicted Pre % 55  P  FVC-Post L 2.09  P  FVC-Predicted Post % 64  P  Pre FEV1/FVC % % 54  P  Post FEV1/FCV % % 44  P  FEV1-Pre L 0.97  P  FEV1-Predicted Pre % 39  P  FEV1-Post L 0.92  P  DLCO uncorrected ml/min/mmHg 12.12  P  DLCO UNC% % 58  P  DLCO corrected ml/min/mmHg 12.12  P  DLCO COR %Predicted % 58  P  DLVA Predicted % 83  P  TLC L 5.68  P  TLC % Predicted % 106  P  RV % Predicted % 161  P    P Preliminary result  PFT 2034: severe obstruction, air trapping, moderate diffusion defect  Labs:  Path:  Echo:  Heart Catheterization:  Assessment & Plan:   Chronic obstructive pulmonary disease, unspecified COPD type (HCC) - Plan: Fluticasone-Umeclidin-Vilant (TRELEGY ELLIPTA) 100-62.5-25 MCG/ACT AEPB, Pulse oximetry,  overnight  Cigarette smoker  Discussion: [name] is a 71 year old woman, daily smoker with coronary artery disease, hypertension and peripheral vascular disease who returns to pulmonary clinic for COPD.   She has severe obstruction on PFTs today.   She will be referred to our lung cancer screening team.   She is to try trelegy ellipta 1 puff daily and stop spiriva inhaler. She can use albuterol inhaler as needed. She is to continue singulair daily.   We will check an overnight oximitry test.   Follow up in 3 months.  Melody Comas, MD [facility] Office: [phone]   Current Outpatient Medications:    albuterol (VENTOLIN HFA) 108 (90 Base) MCG/ACT inhaler, Inhale 1-2 puffs into the lungs every 6 (six) hours as needed for wheezing or shortness of breath., Disp: 18 g, Rfl: 2    ALPRAZolam (XANAX) 0.25 MG tablet, TAKE 1 TABLET BY MOUTH EVERY DAY, Disp: 30 tablet, Rfl: 0   amLODipine (NORVASC) 10 MG tablet, Take 1 tablet (10 mg total) by mouth daily., Disp: 90 tablet, Rfl: 3   aspirin 81 MG tablet, Take 81 mg by mouth daily., Disp: , Rfl:    carvedilol (COREG) 3.125 MG tablet, Take 1 tablet (3.125 mg total) by mouth 2 (two) times daily with a meal., Disp: 180 tablet, Rfl: 3   ezetimibe (ZETIA) 10 MG tablet, Take 1 tablet (10 mg total) by mouth daily. For cholesterol, Disp: 90 tablet, Rfl: 3   Fluticasone-Umeclidin-Vilant (TRELEGY ELLIPTA) 100-62.5-25 MCG/ACT AEPB, Inhale 1 puff into the lungs daily., Disp: 28 each, Rfl: 11   Fluticasone-Umeclidin-Vilant (TRELEGY ELLIPTA) 100-62.5-25 MCG/ACT AEPB, Inhale 1 puff into the lungs daily., Disp: 1 each, Rfl: 0   lisinopril (ZESTRIL) 20 MG tablet, Take 1 tablet (20 mg total) by mouth daily., Disp: 90 tablet, Rfl: 3   montelukast (SINGULAIR) 10 MG tablet, Take 1 tablet (10 mg total) by mouth at bedtime., Disp: 30 tablet, Rfl: 11   nitroGLYCERIN (NITROSTAT) 0.4 MG SL tablet, PLACE 1 TABLET ON THE TONGUE &LET DISSOLVE EVERY 5 MINS AS NEEDED FOR CHEST PAIN, Disp: 25 tablet, Rfl: 1   omeprazole (PRILOSEC) 10 MG capsule, TAKE 1 CAPSULE (10 MG TOTAL) BY MOUTH DAILY AS NEEDED., Disp: 90 capsule, Rfl: 1   rosuvastatin (CRESTOR) 40 MG tablet, Take 1 tablet (40 mg total) by mouth daily. NEED APPOINTMENT, Disp: 90 tablet, Rfl: 3   triamcinolone cream (KENALOG) 0.1 %, Apply 1 application topically 2 (two) times daily., Disp: 30 g, Rfl: 2   vitamin B-12 (CYANOCOBALAMIN) 100 MCG tablet, Take 100 mcg by mouth daily., Disp: , Rfl:    vitamin E 100 UNIT capsule, Take 100 Units by mouth daily. , Disp: , Rfl:

## 2022-05-02 NOTE — Patient Instructions (Addendum)
We will have our lung cancer screening team reach out to you for evaluation and scheduling of your CT Chest scan  Stop the spiriva inhaler  Start trelegy ellipta 1 puff daily - rinse mouth out after each use  Continue to use albuterol inhaler 1-2 puffs every 4-6 hours as needed   We will check an overnight oxygen study  Follow up in 3 months

## 2022-05-10 ENCOUNTER — Encounter: Payer: Self-pay | Admitting: Pulmonary Disease

## 2022-05-14 ENCOUNTER — Other Ambulatory Visit: Payer: Self-pay

## 2022-05-14 DIAGNOSIS — Z87891 Personal history of nicotine dependence: Secondary | ICD-10-CM

## 2022-05-14 DIAGNOSIS — Z122 Encounter for screening for malignant neoplasm of respiratory organs: Secondary | ICD-10-CM

## 2022-05-14 DIAGNOSIS — F17210 Nicotine dependence, cigarettes, uncomplicated: Secondary | ICD-10-CM

## 2022-06-01 ENCOUNTER — Other Ambulatory Visit: Payer: Self-pay | Admitting: Pulmonary Disease

## 2022-06-01 DIAGNOSIS — J449 Chronic obstructive pulmonary disease, unspecified: Secondary | ICD-10-CM

## 2022-06-24 ENCOUNTER — Telehealth: Payer: Self-pay | Admitting: Cardiology

## 2022-06-24 NOTE — Telephone Encounter (Signed)
I tried to call the pt back to let her know that I have not seen a clearance request come from the surgeon at this point.

## 2022-06-24 NOTE — Telephone Encounter (Signed)
Follow Up:      Patient said she was told by her surgeon's office to call and see if you had received her clearance? They said they had sent one, but had not heard back from it.

## 2022-06-25 ENCOUNTER — Telehealth: Payer: Self-pay

## 2022-06-25 NOTE — Telephone Encounter (Signed)
   Pre-operative Risk Assessment    Patient Name: [name]  DOB: Aug 03, 1950 MRN: [id number]     Request for Surgical Clearance    Procedure:   YKZLDJT HERNIA  Date of Surgery:  Clearance TBD                                 Surgeon:  DR Abram Sander Group or Practice Name:  [facility] Phone number:  [phone] Fax number:  [phone]   Type of Clearance Requested:   - Medical FURTHER MEDICAL WORK UP BEFORE CLEARANCE CAN BE GIVEN   Type of Anesthesia:  General    Additional requests/questions:  Please advise surgeon/provider what medications should be held. Please fax a copy of CARDIAC CLEARANCE to the surgeon's office.  Signed, Jeanmarie Plant Dashiel Bergquist  CCMA 06/25/2022, 3:15 PM

## 2022-06-25 NOTE — Telephone Encounter (Signed)
Lvm to call back to see about Surgical Clearance from requesting provider

## 2022-06-27 ENCOUNTER — Telehealth: Payer: Self-pay

## 2022-06-27 NOTE — Telephone Encounter (Signed)
Pt called to ensure this office had received a surgical clearance request for her hernia operation.  Reviewed pt's chart, returned call for clarification, no answer, lf vm confirming receipt of faxed clearance form. Informed her that it will be signed and faxed back. If she doesn't hear from someone by the middle of next week, she should call back.

## 2022-06-28 NOTE — Telephone Encounter (Signed)
Primary Cardiologist:James Hochrein, MD  Chart reviewed as part of pre-operative protocol coverage. Because of [name] past medical history and time since last visit, he/she will require a follow-up visit in order to better assess preoperative cardiovascular risk.  Pre-op covering staff: - Please schedule appointment and call patient to inform them. - Please contact requesting surgeon's office via preferred method (i.e, phone, fax) to inform them of need for appointment prior to surgery.  Per office protocol, if she is not having symptoms of ACS at the time of her office visit, she is cleared to hold aspirin for the upcoming procedure if indicated.    [name], NP-C  06/28/2022, 12:39 PM [address], [address] Office [phone] Fax [phone]

## 2022-06-28 NOTE — Telephone Encounter (Signed)
See clearance notes in the chart. I did inform the pt she is mot likely going to need an appt in the office as we last saw her 06/04/21. Pt said that is fine.

## 2022-06-28 NOTE — Telephone Encounter (Signed)
Pt has been scheduled to see Coletta Memos, FNP in the office 07/01/22 @ [facility] at 10:30.

## 2022-06-30 NOTE — Progress Notes (Unsigned)
Cardiology Clinic Note   Patient Name: [name] Date of Encounter: 07/01/2022  Primary Care Provider:  Michael Boston, MD Primary Cardiologist:  Minus Breeding, MD  Patient Profile    [name] 72 year old female presents the clinic today for follow-up evaluation of her coronary artery disease, ischemic cardiomyopathy, and essential hypertension.  She also presents for preoperative cardiac evaluation.  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, jaw ([place])    Peripheral vascular disease ([place])    carotid to subclavian artery bypass graft   Smoker    Past Surgical History:  Procedure Laterality Date   CAROTID ARTERY - SUBCLAVIAN ARTERY BYPASS GRAFT Left 04/18/2007   COLONOSCOPY  2008   CORONARY ARTERY BYPASS GRAFT     CORONARY/GRAFT ACUTE MI REVASCULARIZATION N/A 05/12/2017   Procedure: Coronary/Graft Acute MI Revascularization;  Surgeon: Sherren Mocha, MD;  Location: [place] [facility];  Service: Cardiovascular;  Laterality: N/A;   DEBRIDEMENT MANDIBLE Left 03/23/2019   Procedure: DEBRIDEMENT LEFT MANDIBLE,SEQUESTRECTOMY;  Surgeon: Michael Litter, DMD;  Location: [place];  Service: Oral Surgery;  Laterality: Left;   KIDNEY SURGERY Left    72 yo   LEFT HEART CATH AND CORONARY ANGIOGRAPHY N/A 05/12/2017   Procedure: LEFT HEART CATH AND CORONARY ANGIOGRAPHY;  Surgeon: Sherren Mocha, MD;  Location: [place] [facility];  Service: Cardiovascular;  Laterality: N/A;   Left Nephrectomy Left 10/17/1983   Dr.  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  Test was negative for ischemia.  She was noted to have a hypertensive response at that time.  She presented for evaluation 05/12/2017 with anterior STEMI after she developed chest discomfort at work and called EMS.  She underwent cardiac catheterization which showed high-grade LAD disease after LIMA and proximal ramus intermedius disease.  She underwent PCI with DES to both areas.    She was seen in follow-up by Dr. Percival Spanish on 06/04/2021.  During that time she was doing okay from a cardiac standpoint.  She had questions about PVD.  She was seen by vascular surgery who is following due to left subclavian stenosis.  She was noted to have good left radial pulse and had no symptoms at the time of evaluation.  She had no new cardiac complaints.  She continued to ride a stationary bike.  She continued to climb ladders.  She denied cardiac symptoms with increased physical activity.  She denied chest discomfort and shortness of breath.  She presents to  the clinic today for follow-up evaluation and states she feels well today.  She reports that she needs to get back to exercising.  Her breathing is much better controlled with her nebulizer treatments and occasional inhaler use.  Her blood pressure today is 132/78.  Her EKG shows normal sinus rhythm possible left atrial enlargement 68 bpm.  We reviewed her upcoming surgery and her previous  cardiac history.  She expressed understanding.  Will plan follow-up in 12 months.  Today she denies chest pain, shortness of breath, lower extremity edema, fatigue, palpitations, melena, hematuria, hemoptysis, diaphoresis, weakness, presyncope, syncope, orthopnea, and PND.   Home Medications    Prior to Admission medications   Medication Sig Start Date End Date Taking? Authorizing Provider  albuterol (VENTOLIN HFA) 108 (90 Base) MCG/ACT inhaler Inhale 1-2 puffs into the lungs every 6 (six) hours as needed for wheezing or shortness of breath. 10/28/19   [place], Velna Hatchet, MD  ALPRAZolam Prudy Feeler) 0.25 MG tablet TAKE 1 TABLET BY MOUTH EVERY DAY 09/18/20   Donita Brooks, MD  amLODipine (NORVASC) 10 MG tablet Take 1 tablet (10 mg total) by mouth daily. 07/31/21   Rollene Rotunda, MD  aspirin 81 MG tablet Take 81 mg by mouth daily.    [provider]  carvedilol (COREG) 3.125 MG tablet Take 1 tablet (3.125 mg total) by mouth 2 (two) times daily with a meal. 07/31/21   Rollene Rotunda, MD  ezetimibe (ZETIA) 10 MG tablet Take 1 tablet (10 mg total) by mouth daily. For cholesterol 07/31/21   Rollene Rotunda, MD  Fluticasone-Umeclidin-Vilant (TRELEGY ELLIPTA) 100-62.5-25 MCG/ACT AEPB Inhale 1 puff into the lungs daily. 05/02/22   Martina Sinner, MD  Fluticasone-Umeclidin-Vilant (TRELEGY ELLIPTA) 100-62.5-25 MCG/ACT AEPB Inhale 1 puff into the lungs daily. 05/02/22   Martina Sinner, MD  lisinopril (ZESTRIL) 20 MG tablet Take 1 tablet (20 mg total) by mouth daily. 07/31/21   Rollene Rotunda, MD  montelukast (SINGULAIR) 10 MG tablet TAKE 1 TABLET BY MOUTH EVERYDAY AT BEDTIME 06/03/22   Martina Sinner, MD  nitroGLYCERIN (NITROSTAT) 0.4 MG SL tablet PLACE 1 TABLET ON THE TONGUE &LET DISSOLVE EVERY 5 MINS AS NEEDED FOR CHEST PAIN 07/31/20   Salley Scarlet, MD  omeprazole (PRILOSEC) 10 MG capsule TAKE 1 CAPSULE (10 MG TOTAL) BY MOUTH DAILY AS NEEDED. 07/26/20   Valentino Nose, NP   rosuvastatin (CRESTOR) 40 MG tablet Take 1 tablet (40 mg total) by mouth daily. NEED APPOINTMENT 07/31/21   Rollene Rotunda, MD  triamcinolone cream (KENALOG) 0.1 % Apply 1 application topically 2 (two) times daily. 06/22/18   [place], Velna Hatchet, MD  vitamin B-12 (CYANOCOBALAMIN) 100 MCG tablet Take 100 mcg by mouth daily.    [provider]  vitamin E 100 UNIT capsule Take 100 Units by mouth daily.     [provider]    Family History    Family History  Problem Relation Age of Onset   Depression Sister    COPD Father    Cancer Sister        hysterectomy sec to cancer   Cancer Sister        cervical cancer   COPD Brother    She indicated that her mother is alive. She indicated that her father is deceased. She indicated that all of her three sisters are alive. She indicated that only one of her three brothers is alive. She indicated that her maternal grandmother is deceased. She indicated that her maternal grandfather  is deceased. She indicated that her paternal grandmother is deceased. She indicated that her paternal grandfather is deceased.  Social History    Social History   Socioeconomic History   Marital status: Married    Spouse name: Not on file   Number of children: 2   Years of education: Not on file   Highest education level: Not on file  Occupational History   Not on file  Tobacco Use   Smoking status: Every Day    Packs/day: 0.25    Years: 40.00    Total pack years: 10.00    Types: Cigarettes   Smokeless tobacco: Never  Vaping Use   Vaping Use: Never used  Substance and Sexual Activity   Alcohol use: Yes    Comment: Occasional Beer   Drug use: No   Sexual activity: Yes    Birth control/protection: Post-menopausal  Other Topics Concern   Not on file  Social History Narrative   Lives alone.     Social Determinants of Health   Financial Resource Strain: Not on file  Food Insecurity: Not on file  Transportation Needs: Not on file   Physical Activity: Not on file  Stress: Not on file  Social Connections: Not on file  Intimate Partner Violence: Not on file     Review of Systems    General:  No chills, fever, night sweats or weight changes.  Cardiovascular:  No chest pain, dyspnea on exertion, edema, orthopnea, palpitations, paroxysmal nocturnal dyspnea. Dermatological: No rash, lesions/masses Respiratory: No cough, dyspnea Urologic: No hematuria, dysuria Abdominal:   No nausea, vomiting, diarrhea, bright red blood per rectum, melena, or hematemesis Neurologic:  No visual changes, wkns, changes in mental status. All other systems reviewed and are otherwise negative except as noted above.  Physical Exam    VS:  BP 132/78 (BP Location: Left Arm, Patient Position: Sitting, Cuff Size: Normal)   Pulse 68   Ht 5\' 7"  (1.702 m)   Wt 104 lb 3.2 oz (47.3 kg)   LMP  (LMP Unknown)   SpO2 95%   BMI 16.32 kg/m  , BMI Body mass index is 16.32 kg/m. GEN: Well nourished, well developed, in no acute distress. HEENT: normal. Neck: Supple, no JVD, carotid bruits, or masses. Cardiac: RRR, no murmurs, rubs, or gallops. No clubbing, cyanosis, edema.  Radials/DP/PT 2+ and equal bilaterally.  Respiratory:  Respirations regular and unlabored, clear to auscultation bilaterally. GI: Soft, nontender, nondistended, BS + x 4. MS: no deformity or atrophy. Skin: warm and dry, no rash. Neuro:  Strength and sensation are intact. Psych: Normal affect.  Accessory Clinical Findings    Recent Labs: No results found for requested labs within last 365 days.   Recent Lipid Panel    Component Value Date/Time   CHOL 127 11/08/2021 1241   TRIG 75 11/08/2021 1241   HDL 58 11/08/2021 1241   CHOLHDL 2.2 11/08/2021 1241   CHOLHDL 2.6 04/10/2020 1447   VLDL 26 05/13/2017 0009   LDLCALC 54 11/08/2021 1241   LDLCALC 103 (H) 04/10/2020 1447         ECG personally reviewed by me today-normal sinus rhythm possible left atrial enlargement  68 bpm- No acute changes  Echocardiogram 04/28/2015  Study Conclusions   - Left ventricle: The cavity size was normal. Wall thickness was    increased in a pattern of mild LVH. Systolic function was mildly    reduced. The estimated ejection fraction was in the range of 45%    to  50%. Distal anterior, apical, apical septal, and inferoapical    hypokinesis suggestive of LAD territory ischemia/infarct. Doppler    parameters are consistent with abnormal left ventricular    relaxation (grade 1 diastolic dysfunction). The E/e&' ratio is    between 8-15, suggesting indeterminate LV filling pressure.  - Mitral valve: Mildly thickened leaflets . There was trivial    regurgitation.  - Left atrium: The atrium was normal in size.  - Tricuspid valve: There was trivial regurgitation.  - Pulmonary arteries: PA peak pressure: 16 mm Hg (S).  - Inferior vena cava: The vessel was normal in size. The    respirophasic diameter changes were in the normal range (>= 50%),    consistent with normal central venous pressure.   Impressions:   - LVEF 45-50%, mild LVH, distal anterior, apical, apical septal and    inferoapical hypokinesis, grade 1 DD, indeterminate LV filling    pressure, trivial MR, normal LA size, trivial TR, RVSP 16 mmHg,    normal IVC.   -------------------------------------------------------------------  Study data:  No prior study was available for comparison.  Study  status:  Routine.  Procedure:  The patient reported no pain pre or  post test. Transthoracic echocardiography. Image quality was  adequate.  Study completion:  There were no complications.  Transthoracic echocardiography.  M-mode, complete 2D, spectral  Doppler, and color Doppler.  Birthdate:  Patient birthdate:  09/11/1950.  Age:  Patient is 72 yr old.  Sex:  Gender: female.  BMI: 18.3 kg/m^2.  Blood pressure:     120/82  Patient status:  Inpatient.  Study date:  Study date: 05/13/2017. Study time: 10:11  AM.   Location:  Bedside.   -------------------------------------------------------------------   -------------------------------------------------------------------  Left ventricle:  The cavity size was normal. Wall thickness was  increased in a pattern of mild LVH. Systolic function was mildly  reduced. The estimated ejection fraction was in the range of 45% to  50%. Distal anterior, apical, apical septal, and inferoapical  hypokinesis suggestive of LAD territory ischemia/infarct. Doppler  parameters are consistent with abnormal left ventricular relaxation  (grade 1 diastolic dysfunction). The E/e&' ratio is between 8-15,  suggesting indeterminate LV filling pressure.   -------------------------------------------------------------------  Aorta: Aortic root: The aortic root was normal in size.  Ascending aorta: The ascending aorta was normal in size.   -------------------------------------------------------------------  Mitral valve:   Mildly thickened leaflets .  Doppler:  There was  trivial regurgitation.    Peak gradient (D): 3 mm Hg.   -------------------------------------------------------------------  Left atrium:  The atrium was normal in size.   -------------------------------------------------------------------  Atrial septum:  No defect or patent foramen ovale was identified.    -------------------------------------------------------------------  Right ventricle:  The cavity size was normal. Wall thickness was  normal. Systolic function was normal.   -------------------------------------------------------------------  Pulmonic valve:    The valve appears to be grossly normal.  Doppler:  There was no significant regurgitation.   -------------------------------------------------------------------  Tricuspid valve:   Doppler:  There was trivial regurgitation.   -------------------------------------------------------------------  Pulmonary artery:   The main pulmonary artery  was normal-sized.   -------------------------------------------------------------------  Right atrium:  The atrium was normal in size.   -------------------------------------------------------------------  Systemic veins:  Inferior vena cava: The vessel was normal in size. The  respirophasic diameter changes were in the normal range (>= 50%),  consistent with normal central venous pressure.   Cardiac catheterization 05/12/2017  1.  Severe mid LAD stenosis treated successfully with PCI using a Synergy DES treated  through the native vessel and stented just beyond the LIMA insertion site 2.  Severe ramus intermedius stenosis treated successfully with PCI using a single drug-eluting stent 3.  Patent left main, left circumflex, and RCA 4.  Mild segmental contraction abnormality of the left ventricle with periapical hypokinesis, LVEF estimated at 45-50%   Recommendations: Post MI medical therapy, aspirin and Brilinta for at least 12 months without interruption.  Angiomax will be continued until the current bag is completed.  Manual pressure will be used for femoral hemostasis.  Diagnostic Dominance: Left  Intervention    Assessment & Plan   1.  Coronary artery disease-status post CABG x 1 in 1996 LIMA-LAD.  Denies recent episodes of chest discomfort.  Echocardiogram 11/16 showed an EF of 45-50%.  Subsequent cardiac catheterization with PCI and DES to mid LAD and ramus intermedius. Continue amlodipine, aspirin, carvedilol, ezetimibe, rosuvastatin Heart healthy low-sodium diet-salty 6 given Increase physical activity as tolerated   Hyperlipidemia-LDL 45 11/22/21. Continue ezetimibe, rosuvastatin Heart healthy low-sodium high-fiber diet Increase physical activity as tolerated Repeat lipids and LFTs in June 2024  Carotid stenosis-denies presyncope, syncope and lightheadedness.  Carotid ultrasound 02/13/2021 showed right and left ICA 1-39% stenosis.  Carotid bruit are present. Continue  statin therapy.  Essential hypertension-BP today 132/78 Continue amlodipine, carvedilol Increase physical activity as tolerated  Preoperative cardiac evaluation-[facility] surgery, Dr. Cherlyn Roberts inguinal hernia repair, TBD    Primary Cardiologist: Minus Breeding, MD  Chart reviewed as part of pre-operative protocol coverage. Given past medical history and time since last visit, based on ACC/AHA guidelines, ARROW EMMERICH would be at acceptable risk for the planned procedure without further cardiovascular testing.   Patient was advised that if she develops new symptoms prior to surgery to contact our office to arrange a follow-up appointment.  He verbalized understanding.  I will route this recommendation to the requesting party via Epic fax function and remove from pre-op pool.   If needed to her aspirin may be held for 5-7 days prior to her procedure.  Please resume as soon as hemostasis is achieved.   Disposition: Follow-up with Dr. Percival Spanish or me in 9-12 months.   Jossie Ng. Joram Venson NP-C     07/01/2022, 11:15 AM [place] [address] Office [phone] Fax [phone]    I spent 14 minutes examining this patient, reviewing medications, and using patient centered shared decision making involving her cardiac care.  Prior to her visit I spent greater than 20 minutes reviewing her past medical history,  medications, and prior cardiac tests.

## 2022-07-01 ENCOUNTER — Encounter: Payer: Self-pay | Admitting: General Practice

## 2022-07-01 ENCOUNTER — Ambulatory Visit: Payer: 59 | Attending: General Practice | Admitting: General Practice

## 2022-07-01 VITALS — BP 132/78 | HR 68 | Ht 67.0 in | Wt 104.2 lb

## 2022-07-01 DIAGNOSIS — E785 Hyperlipidemia, unspecified: Secondary | ICD-10-CM | POA: Diagnosis not present

## 2022-07-01 DIAGNOSIS — I6523 Occlusion and stenosis of bilateral carotid arteries: Secondary | ICD-10-CM

## 2022-07-01 DIAGNOSIS — Z01810 Encounter for preprocedural cardiovascular examination: Secondary | ICD-10-CM

## 2022-07-01 DIAGNOSIS — I2510 Atherosclerotic heart disease of native coronary artery without angina pectoris: Secondary | ICD-10-CM | POA: Diagnosis not present

## 2022-07-01 DIAGNOSIS — I10 Essential (primary) hypertension: Secondary | ICD-10-CM | POA: Diagnosis not present

## 2022-07-01 NOTE — Patient Instructions (Signed)
Medication Instructions:  The current medical regimen is effective;  continue present plan and medications as directed. Please refer to the Current Medication list given to you today.  *If you need a refill on your cardiac medications before your next appointment, please call your pharmacy*  Lab Work: IN [place] 2024-FASTING LIPID AND LFT  If you have labs (blood work) drawn today and your tests are completely normal, you will receive your results only by: [place] (if you have MyChart) OR  A paper copy in the mail If you have any lab test that is abnormal or we need to change your treatment, we will call you to review the results.  Other Instructions CLEARED FOR SURGERY-HERNIA   INCREASE PHYSICAL ACTIVITY AS TOLERATED  PLEASE READ AND FOLLOW ATTACHED  SALTY 6   Follow-Up: At [facility], you and your health needs are our priority.  As part of our continuing mission to provide you with exceptional heart care, we have created designated Provider Care Teams.  These Care Teams include your primary Cardiologist (physician) and Advanced Practice Providers (APPs -  Physician Assistants and Nurse Practitioners) who all work together to provide you with the care you need, when you need it.  We recommend signing up for the patient portal called "MyChart".  Sign up information is provided on this After Visit Summary.  MyChart is used to connect with patients for Virtual Visits (Telemedicine).  Patients are able to view lab/test results, encounter notes, upcoming appointments, etc.  Non-urgent messages can be sent to your provider as well.   To learn more about what you can do with MyChart, go to [email or web address].    Your next appointment:   12 month(s)  Provider:   Minus Breeding, MD  or Coletta Memos, FNP

## 2022-07-03 ENCOUNTER — Ambulatory Visit (INDEPENDENT_AMBULATORY_CARE_PROVIDER_SITE_OTHER): Payer: 59 | Admitting: Acute Care

## 2022-07-03 ENCOUNTER — Encounter: Payer: Self-pay | Admitting: Acute Care

## 2022-07-03 DIAGNOSIS — F17210 Nicotine dependence, cigarettes, uncomplicated: Secondary | ICD-10-CM

## 2022-07-03 NOTE — Progress Notes (Signed)
Virtual Visit via Telephone Note  I connected with [name] on 07/03/22 at  8:30 AM EST by telephone and verified that I am speaking with the correct person using two identifiers.  Location: Patient: At home Provider: [place], [place], [place], [address]    I discussed the limitations, risks, security and privacy concerns of performing an evaluation and management service by telephone and the availability of in person appointments. I also discussed with the patient that there may be a patient responsible charge related to this service. The patient expressed understanding and agreed to proceed.   Shared Decision Making Visit Lung Cancer Screening Program [phone])   Eligibility: Age 72 y.o. Pack Years Smoking History Calculation 24 pack year smoking history (# packs/per year x # years smoked) Recent History of coughing up blood  no Unexplained weight loss? no ( >Than 15 pounds within the last 6 months ) Prior History Lung / other cancer no (Diagnosis within the last 5 years already requiring surveillance chest CT Scans). Smoking Status Current Smoker Former Smokers: Years since quit:  NA  Quit Date:  NA  Visit Components: Discussion included one or more decision making aids. yes Discussion included risk/benefits of screening. yes Discussion included potential follow up diagnostic testing for abnormal scans. yes Discussion included meaning and risk of over diagnosis. yes Discussion included meaning and risk of False Positives. yes Discussion included meaning of total radiation exposure. yes  Counseling Included: Importance of adherence to annual lung cancer LDCT screening. yes Impact of comorbidities on ability to participate in the program. yes Ability and willingness to under diagnostic treatment. yes  Smoking Cessation Counseling: Current Smokers:  Discussed importance of smoking cessation. yes Information about tobacco cessation classes and  interventions provided to patient. yes Patient provided with "ticket" for LDCT Scan. yes Symptomatic Patient. no  Counseling NA Diagnosis Code: Tobacco Use Z72.0 Asymptomatic Patient yes  Counseling (Intermediate counseling: > three minutes counseling) [id number] Former Smokers:  Discussed the importance of maintaining cigarette abstinence. yes Diagnosis Code: Personal History of Nicotine Dependence. M84.132 Information about tobacco cessation classes and interventions provided to patient. Yes Patient provided with "ticket" for LDCT Scan. yes Written Order for Lung Cancer Screening with LDCT placed in Epic. Yes (CT Chest Lung Cancer Screening Low Dose W/O CM) [id number] Z12.2-Screening of respiratory organs Z87.891-Personal history of nicotine dependence  I have spent 25 minutes of face to face/ virtual visit   time with  Ms. Hogland discussing the risks and benefits of lung cancer screening. We viewed / discussed a power point together that explained in detail the above noted topics. We paused at intervals to allow for questions to be asked and answered to ensure understanding.We discussed that the single most powerful action that she can take to decrease her risk of developing lung cancer is to quit smoking. We discussed whether or not she is ready to commit to setting a quit date. We discussed options for tools to aid in quitting smoking including nicotine replacement therapy, non-nicotine medications, support groups, Quit Smart classes, and behavior modification. We discussed that often times setting smaller, more achievable goals, such as eliminating 1 cigarette a day for a week and then 2 cigarettes a day for a week can be helpful in slowly decreasing the number of cigarettes smoked. This allows for a sense of accomplishment as well as providing a clinical benefit. I provided  her  with smoking cessation  information  with contact information for community resources, classes, free  nicotine replacement  therapy, and access to mobile apps, text messaging, and on-line smoking cessation help. I have also provided  her  the office contact information in the event she needs to contact me, or the screening staff. We discussed the time and location of the scan, and that either Doroteo Glassman RN, Joella Prince, RN  or I will call / send a letter with the results within 24-72 hours of receiving them. The patient verbalized understanding of all of  the above and had no further questions upon leaving the office. They have my contact information in the event they have any further questions.  I spent 3 minutes counseling on smoking cessation and the health risks of continued tobacco abuse.  I explained to the patient that there has been a high incidence of coronary artery disease noted on these exams. I explained that this is a non-gated exam therefore degree or severity cannot be determined. This patient is on statin therapy. I have asked the patient to follow-up with their PCP regarding any incidental finding of coronary artery disease and management with diet or medication as their PCP  feels is clinically indicated. The patient verbalized understanding of the above and had no further questions upon completion of the visit.      Magdalen Spatz, NP 07/03/2022

## 2022-07-03 NOTE — Patient Instructions (Signed)
Thank you for participating in the [facility]. It was our pleasure to meet you today. We will call you with the results of your scan within the next few days. Your scan will be assigned a Lung RADS category score by the physicians reading the scans.  This Lung RADS score determines follow up scanning.  See below for description of categories, and follow up screening recommendations. We will be in touch to schedule your follow up screening annually or based on recommendations of our providers. We will fax a copy of your scan results to your Primary Care Physician, or the physician who referred you to the program, to ensure they have the results. Please call the office if you have any questions or concerns regarding your scanning experience or results.  Our office number is [phone]. Please speak with Denise Phelps, RN. , or  Denise Buckner RN, They are  our Lung Cancer Screening RN.'s If They are unavailable when you call, Please leave a message on the voice mail. We will return your call at our earliest convenience.This voice mail is monitored several times a day.  Remember, if your scan is normal, we will scan you annually as long as you continue to meet the criteria for the program. (Age 55-77, Current smoker or smoker who has quit within the last 15 years). If you are a smoker, remember, quitting is the single most powerful action that you can take to decrease your risk of lung cancer and other pulmonary, breathing related problems. We know quitting is hard, and we are here to help.  Please let us know if there is anything we can do to help you meet your goal of quitting. If you are a former smoker, congratulations. We are proud of you! Remain smoke free! Remember you can refer friends or family members through the number above.  We will screen them to make sure they meet criteria for the program. Thank you for helping us take better care of you by  participating in Lung Screening.  You can receive free nicotine replacement therapy ( patches, gum or mints) by calling 1-800-QUIT NOW. Please call so we can get you on the path to becoming  a non-smoker. I know it is hard, but you can do this!  Lung RADS Categories:  Lung RADS 1: no nodules or definitely non-concerning nodules.  Recommendation is for a repeat annual scan in 12 months.  Lung RADS 2:  nodules that are non-concerning in appearance and behavior with a very low likelihood of becoming an active cancer. Recommendation is for a repeat annual scan in 12 months.  Lung RADS 3: nodules that are probably non-concerning , includes nodules with a low likelihood of becoming an active cancer.  Recommendation is for a 6-month repeat screening scan. Often noted after an upper respiratory illness. We will be in touch to make sure you have no questions, and to schedule your 6-month scan.  Lung RADS 4 A: nodules with concerning findings, recommendation is most often for a follow up scan in 3 months or additional testing based on our provider's assessment of the scan. We will be in touch to make sure you have no questions and to schedule the recommended 3 month follow up scan.  Lung RADS 4 B:  indicates findings that are concerning. We will be in touch with you to schedule additional diagnostic testing based on our provider's  assessment of the scan.  Other options for assistance in smoking cessation (   As covered by your insurance benefits)  Hypnosis for smoking cessation  [employer] [phone]  Acupuncture for smoking cessation  [facility] [phone]   

## 2022-07-04 ENCOUNTER — Ambulatory Visit
Admission: RE | Admit: 2022-07-04 | Discharge: 2022-07-04 | Disposition: A | Discharge status: Home / Self Care | Payer: 59 | Source: Ambulatory Visit | Attending: Internal Medicine | Admitting: Internal Medicine

## 2022-07-04 DIAGNOSIS — F17210 Nicotine dependence, cigarettes, uncomplicated: Secondary | ICD-10-CM

## 2022-07-04 DIAGNOSIS — Z87891 Personal history of nicotine dependence: Secondary | ICD-10-CM

## 2022-07-04 DIAGNOSIS — Z122 Encounter for screening for malignant neoplasm of respiratory organs: Secondary | ICD-10-CM

## 2022-07-04 NOTE — Progress Notes (Signed)
Fax received for medical clearance/medication hold for Dr. Clark.  Provider signed, form faxed back to sender, verified successful, sent to scan center.  

## 2022-07-05 ENCOUNTER — Other Ambulatory Visit: Payer: Self-pay

## 2022-07-05 ENCOUNTER — Telehealth: Payer: Self-pay | Admitting: Acute Care

## 2022-07-05 DIAGNOSIS — F17210 Nicotine dependence, cigarettes, uncomplicated: Secondary | ICD-10-CM

## 2022-07-05 DIAGNOSIS — Z122 Encounter for screening for malignant neoplasm of respiratory organs: Secondary | ICD-10-CM

## 2022-07-05 DIAGNOSIS — Z87891 Personal history of nicotine dependence: Secondary | ICD-10-CM

## 2022-07-05 NOTE — Telephone Encounter (Signed)
Contacted patient by phone, using two patient identifiers, to review results of recent LDCT.  No suspicious findings for lung cancer.  Atherosclerosis and emphysema noted.  Also noted were mild calcifications of the aortic valve.  Patient is on statin medication and sees cardiology.  Explained sometimes an echocardiogram is done to look at the heart structures more closely when calcifications of the valves are noted.  Will route results to PCP and cardiology for review and any further recommendations.  Patient acknowledged understanding.  Results faxed to PCP and cardiologist.  New order placed for annual LDCT 2025.

## 2022-08-23 NOTE — Progress Notes (Signed)
Sent message, via epic in basket, requesting orders in epic from surgeon.  

## 2022-08-24 NOTE — Patient Instructions (Signed)
SURGICAL WAITING ROOM VISITATION Patients having surgery or a procedure may have no more than 2 support people in the waiting area - these visitors may rotate in the visitor waiting room.   Due to an increase in RSV and influenza rates and associated hospitalizations, children ages 22 and under may not visit patients in [place]. If the patient needs to stay at the hospital during part of their recovery, the visitor guidelines for inpatient rooms apply.  PRE-OP VISITATION  Pre-op nurse will coordinate an appropriate time for 1 support person to accompany the patient in pre-op.  This support person may not rotate.  This visitor will be contacted when the time is appropriate for the visitor to come back in the pre-op area.  Please refer to the [facility] website for the visitor guidelines for Inpatients (after your surgery is over and you are in a regular room).  You are not required to quarantine at this time prior to your surgery. However, you must do this: Hand Hygiene often Do NOT share personal items Notify your provider if you are in close contact with someone who has COVID or you develop fever 100.4 or greater, new onset of sneezing, cough, sore throat, shortness of breath or body aches.  If you test positive for Covid or have been in contact with anyone that has tested positive in the last 10 days please notify you surgeon.    Your procedure is scheduled on:  Friday  August 30, 2022  Report to [facility] Main Entrance: [place] entrance where the [employer] is available.   Report to admitting at: 08:15    AM  +++++Call this number if you have any questions or problems the morning of surgery [phone]  Do not eat food after Midnight the night prior to your surgery/procedure.  After Midnight you may have the following liquids until  07:30 AM DAY OF SURGERY  Clear Liquid Diet Water Black Coffee (sugar ok, NO MILK/CREAM OR CREAMERS)  Tea (sugar ok, NO  MILK/CREAM OR CREAMERS) regular and decaf                             Plain Jell-O  with no fruit (NO RED)                                           Fruit ices (not with fruit pulp, NO RED)                                     Popsicles (NO RED)                                                                  Juice: apple, WHITE grape, WHITE cranberry Sports drinks like Gatorade or Powerade (NO RED)               FOLLOW BOWEL PREP AND ANY ADDITIONAL PRE OP INSTRUCTIONS YOU RECEIVED FROM YOUR SURGEON'S OFFICE!!!   Oral Hygiene is also important to reduce your risk of infection.  Remember - BRUSH YOUR TEETH THE MORNING OF SURGERY WITH YOUR REGULAR TOOTHPASTE  Do NOT smoke after Midnight the night before surgery.  Take ONLY these medicines the morning of surgery with A SIP OF WATER:  amlodipine, carvedilol,   You may take Alprazolam if needed and You may use Albuterol inhaler if needed.                   You may not have any metal on your body including hair pins, jewelry, and body piercing  Do not wear make-up, lotions, powders, perfumes or deodorant  Do not wear nail polish including gel and S&S, artificial / acrylic nails, or any other type of covering on natural nails including finger and toenails. If you have artificial nails, gel coating, etc., that needs to be removed by a nail salon, Please have this removed prior to surgery. Not doing so may mean that your surgery could be cancelled or delayed if the Surgeon or anesthesia staff feels like they are unable to monitor you safely.   Do not shave 48 hours prior to surgery to avoid nicks in your skin which may contribute to postoperative infections.   Contacts, Hearing Aids, dentures or bridgework may not be worn into surgery. DENTURES WILL BE REMOVED PRIOR TO SURGERY PLEASE DO NOT APPLY "Poly grip" OR ADHESIVES!!!  Patients discharged on the day of surgery will not be allowed to drive home.  Someone NEEDS to stay with you for the  first 24 hours after anesthesia.  Do not bring your home medications to the hospital EXCEPT THE ALBUTEROL INHALER. The Pharmacy will dispense medications listed on your medication list to you during your admission in the Hospital.  Please read over the following fact sheets you were given: IF YOU HAVE [place], [place] [phone].   - Preparing for Surgery Before surgery, you can play an important role.  Because skin is not sterile, your skin needs to be as free of germs as possible.  You can reduce the number of germs on your skin by washing with CHG (chlorahexidine gluconate) soap before surgery.  CHG is an antiseptic cleaner which kills germs and bonds with the skin to continue killing germs even after washing. Please DO NOT use if you have an allergy to CHG or antibacterial soaps.  If your skin becomes reddened/irritated stop using the CHG and inform your nurse when you arrive at Short Stay. Do not shave (including legs and underarms) for at least 48 hours prior to the first CHG shower.  You may shave your face/neck.  Please follow these instructions carefully:  1.  Shower with CHG Soap the night before surgery and the  morning of surgery.  2.  If you choose to wash your hair, wash your hair first as usual with your normal  shampoo.  3.  After you shampoo, rinse your hair and body thoroughly to remove the shampoo.                             4.  Use CHG as you would any other liquid soap.  You can apply chg directly to the skin and wash.  Gently with a scrungie or clean washcloth.  5.  Apply the CHG Soap to your body ONLY FROM THE NECK DOWN.   Do not use on face/ open  Wound or open sores. Avoid contact with eyes, ears mouth and genitals (private parts).                       Wash face,  Genitals (private parts) with your normal soap.             6.  Wash thoroughly, paying special attention to the area where your   surgery  will be performed.  7.  Thoroughly rinse your body with warm water from the neck down.  8.  DO NOT shower/wash with your normal soap after using and rinsing off the CHG Soap.            9.  Pat yourself dry with a clean towel.            10.  Wear clean pajamas.            11.  Place clean sheets on your bed the night of your first shower and do not  sleep with pets.  ON THE DAY OF SURGERY : Do not apply any lotions/deodorants the morning of surgery.  Please wear clean clothes to the hospital/surgery center.    FAILURE TO FOLLOW THESE INSTRUCTIONS MAY RESULT IN THE CANCELLATION OF YOUR SURGERY  PATIENT SIGNATURE_________________________________  NURSE SIGNATURE__________________________________  ________________________________________________________________________

## 2022-08-24 NOTE — Progress Notes (Signed)
COVID Vaccine received:  '[]'$  No '[x]'$  Yes Date of any COVID positive Test in last 90 days:  None  PCP - Cristie Hem, MD Cardiologist - Minus Breeding, MD Coletta Memos, NP  cardiac clearance 07-01-22 note in Epic  Chest x-ray - CT chest 07-04-2022  epic EKG - 07-01-2022  epic  Stress Test -  ECHO - 05-13-2017  epic Cardiac Cath - 05-12-2017  [facility] by Dr. Burt Knack  PCR screen: '[]'$  Ordered & Completed                      '[]'$   No Order but Needs PROFEND                      '[x]'$   N/A for this surgery  Surgery Plan:  '[x]'$  Ambulatory                            '[]'$  Outpatient in bed                            '[]'$  Admit  Anesthesia:    '[x]'$  General  '[]'$  Spinal                           '[]'$   Choice '[]'$   MAC  Bowel Prep - '[x]'$  No  '[]'$   Yes ______  Pacemaker / ICD device '[x]'$  No '[]'$  Yes        Device order form faxed '[x]'$  No    '[]'$   Yes      Faxed to:  Spinal Cord Stimulator:'[x]'$  No '[]'$  Yes      (Remind patient to bring remote DOS) Other Implants:   History of Sleep Apnea? '[x]'$  No '[]'$  Yes   CPAP used?- '[x]'$  No '[]'$  Yes    Does the patient monitor blood sugar? '[]'$  No '[]'$  Yes  '[x]'$  N/A  Blood Thinner / Instructions: none Aspirin Instructions: ASA 81 mg  hold 5-7 days, ok per Annamaria Boots. Patient is aware  ERAS Protocol Ordered: '[x]'$  No  '[]'$  Yes PRE-SURGERY '[]'$  ENSURE  '[]'$  G2  '[x]'$  No Drink Ordered    Patient is to be NPO after: 0730 as per anesthesia standing orders, no order from Dr. Kieth Brightly (just has ERAS in the case booking special needs section)  Comments: NO SURGEON ORDERS as of PST appt on 08-27-2022.  Tamika sent IB message on 08-23-22 and I spoke to Triage nurse at [place] on 08-26-22.  Activity level: Patient is able to climb a flight of stairs without difficulty; '[x]'$  No CP  '[x]'$  No SOB. Patient can perform ADLs without assistance.   Anesthesia review: CAD- cath, STEMI- CABG x 1 2018, COPD, smoker, HTN, osteonecrosis of jaw d/t medication, Hx carotid-subclavian Bypass, Left nephrectomy 1985  Patient denies  shortness of breath, fever, cough and chest pain at PAT appointment.  Patient verbalized understanding and agreement to the Pre-Surgical Instructions that were given to them at this PAT appointment. Patient was also educated of the need to review these PAT instructions again prior to his/her surgery.I reviewed the appropriate phone numbers to call if they have any and questions or concerns.

## 2022-08-26 NOTE — Progress Notes (Signed)
Called and spoke with the [place] Triage nurse, I asked her to get Dr. Kieth Brightly to place orders for this patient. I will see the patient tomorrow morning; surgery is scheduled for 08-30-22  left inguinal hernia w/ mesh  Leota Jacobsen, BSN, CVRN-BC   Pre-Surgical Testing Nurse [place]  [phone]

## 2022-08-27 ENCOUNTER — Other Ambulatory Visit: Payer: Self-pay

## 2022-08-27 ENCOUNTER — Encounter (HOSPITAL_COMMUNITY): Payer: Self-pay

## 2022-08-27 ENCOUNTER — Encounter (HOSPITAL_COMMUNITY)
Admission: RE | Admit: 2022-08-27 | Discharge: 2022-08-27 | Disposition: A | Discharge status: Home / Self Care | Payer: Medicare Other | Source: Ambulatory Visit | Attending: General Surgery | Admitting: General Surgery

## 2022-08-27 VITALS — BP 187/68 | HR 71 | Temp 98.2°F | Resp 16 | Ht 67.0 in | Wt 102.8 lb

## 2022-08-27 DIAGNOSIS — K4090 Unilateral inguinal hernia, without obstruction or gangrene, not specified as recurrent: Secondary | ICD-10-CM | POA: Insufficient documentation

## 2022-08-27 DIAGNOSIS — Z87891 Personal history of nicotine dependence: Secondary | ICD-10-CM | POA: Insufficient documentation

## 2022-08-27 DIAGNOSIS — J449 Chronic obstructive pulmonary disease, unspecified: Secondary | ICD-10-CM | POA: Insufficient documentation

## 2022-08-27 DIAGNOSIS — Z01812 Encounter for preprocedural laboratory examination: Secondary | ICD-10-CM | POA: Diagnosis present

## 2022-08-27 DIAGNOSIS — Z951 Presence of aortocoronary bypass graft: Secondary | ICD-10-CM | POA: Diagnosis not present

## 2022-08-27 DIAGNOSIS — I10 Essential (primary) hypertension: Secondary | ICD-10-CM | POA: Diagnosis not present

## 2022-08-27 DIAGNOSIS — I2510 Atherosclerotic heart disease of native coronary artery without angina pectoris: Secondary | ICD-10-CM

## 2022-08-27 DIAGNOSIS — I739 Peripheral vascular disease, unspecified: Secondary | ICD-10-CM | POA: Insufficient documentation

## 2022-08-27 LAB — CBC
HCT: 43.8 % (ref 36.0–46.0)
Hemoglobin: 14.1 g/dL (ref 12.0–15.0)
MCH: 30.9 pg (ref 26.0–34.0)
MCHC: 32.2 g/dL (ref 30.0–36.0)
MCV: 96.1 fL (ref 80.0–100.0)
Platelets: 227 10*3/uL (ref 150–400)
RBC: 4.56 MIL/uL (ref 3.87–5.11)
RDW: 14.9 % (ref 11.5–15.5)
WBC: 7.3 10*3/uL (ref 4.0–10.5)
nRBC: 0 % (ref 0.0–0.2)

## 2022-08-27 LAB — BASIC METABOLIC PANEL
Anion gap: 7 (ref 5–15)
BUN: 19 mg/dL (ref 8–23)
CO2: 27 mmol/L (ref 22–32)
Calcium: 9.6 mg/dL (ref 8.9–10.3)
Chloride: 106 mmol/L (ref 98–111)
Creatinine, Ser: 1.07 mg/dL — ABNORMAL HIGH (ref 0.44–1.00)
GFR, Estimated: 56 mL/min — ABNORMAL LOW (ref >60)
Glucose, Bld: 111 mg/dL — ABNORMAL HIGH (ref 70–99)
Potassium: 4.5 mmol/L (ref 3.5–5.1)
Sodium: 140 mmol/L (ref 135–145)

## 2022-08-28 NOTE — Anesthesia Preprocedure Evaluation (Addendum)
Anesthesia Evaluation    Airway Mallampati: I  TM Distance: >3 FB Neck ROM: Full    Dental  (+) Edentulous Upper, Edentulous Lower   Pulmonary COPD,  COPD inhaler, Current Smoker and Patient abstained from smoking.    + decreased breath sounds      Cardiovascular hypertension, Pt. on medications and Pt. on home beta blockers + CAD, + Past MI, + CABG and + Peripheral Vascular Disease   Rhythm:Regular Rate:Normal     Neuro/Psych negative neurological ROS  negative psych ROS   GI/Hepatic Neg liver ROS,GERD  Medicated and Controlled,,  Endo/Other  negative endocrine ROS    Renal/GU Renal disease     Musculoskeletal negative musculoskeletal ROS (+)    Abdominal   Peds  Hematology negative hematology ROS (+)   Anesthesia Other Findings   Reproductive/Obstetrics                             Anesthesia Physical Anesthesia Plan  ASA: 3  Anesthesia Plan: General   Post-op Pain Management: Tylenol PO (pre-op)*   Induction: Intravenous  PONV Risk Score and Plan: 3 and Ondansetron, Dexamethasone and Midazolam  Airway Management Planned: Oral ETT  Additional Equipment: None  Intra-op Plan:   Post-operative Plan: Extubation in OR  Informed Consent: I have reviewed the patients History and Physical, chart, labs and discussed the procedure including the risks, benefits and alternatives for the proposed anesthesia with the patient or authorized representative who has indicated his/her understanding and acceptance.     Dental advisory given  Plan Discussed with: CRNA  Anesthesia Plan Comments: (See PAT note 08/27/2022)       Anesthesia Quick Evaluation

## 2022-08-28 NOTE — Progress Notes (Signed)
Anesthesia Chart Review   Case: [id number] Date/Time: 08/30/22 1015   Procedure: ROBOTIC LEFT INGUINAL HERNIA REPAIR WITH MESH (Left)   Anesthesia type: General   Pre-op diagnosis: LEFT INGUINAL HERNIA   Location: [facility] ROOM 05 / [facility]   Surgeons: Kinsinger, Arta Bruce, MD       DISCUSSION:71 y.o. smoker with h/o HTN, COPD, CAD (CABG 2018), PVD, left inguinal hernia scheduled for above procedure 08/30/2022 with Dr. Gurney Maxin.   Pt last seen by cardiology 07/01/2022. Per OV note, "Chart reviewed as part of pre-operative protocol coverage. Given past medical history and time since last visit, based on ACC/AHA guidelines, [name] would be at acceptable risk for the planned procedure without further cardiovascular testing.    Patient was advised that if she develops new symptoms prior to surgery to contact our office to arrange a follow-up appointment.  He verbalized understanding."  Pt last seen by vascular surgeon 02/13/2021. Stable at this visit with 3 year follow up recommended.   Anticipate pt can proceed with planned procedure barring acute status change.   VS: BP (!) 187/68 Comment: right arm sitting,  did not take her medication this morning  Pulse 71   Temp 36.8 C (Oral)   Resp 16   Ht '5\' 7"'$  (1.702 m)   Wt 46.6 kg   LMP  (LMP Unknown)   SpO2 100%   BMI 16.10 kg/m   PROVIDERS: Michael Boston, MD is PCP   Cardiologist - Minus Breeding, MD  LABS: Labs reviewed: Acceptable for surgery. (all labs ordered are listed, but only abnormal results are displayed)  Labs Reviewed  BASIC METABOLIC PANEL - Abnormal; Notable for the following components:      Result Value   Glucose, Bld 111 (*)    Creatinine, Ser 1.07 (*)    GFR, Estimated 56 (*)    All other components within normal limits  CBC     IMAGES:   EKG:   CV: Echo 05/13/2017 - Left ventricle: The cavity size was normal. Wall thickness was    increased in a pattern of mild LVH. Systolic function was  mildly    reduced. The estimated ejection fraction was in the range of 45%    to 50%. Distal anterior, apical, apical septal, and inferoapical    hypokinesis suggestive of LAD territory ischemia/infarct. Doppler    parameters are consistent with abnormal left ventricular    relaxation (grade 1 diastolic dysfunction). The E/e&' ratio is    between 8-15, suggesting indeterminate LV filling pressure.  - Mitral valve: Mildly thickened leaflets . There was trivial    regurgitation.  - Left atrium: The atrium was normal in size.  - Tricuspid valve: There was trivial regurgitation.  - Pulmonary arteries: PA peak pressure: 16 mm Hg (S).  - Inferior vena cava: The vessel was normal in size. The    respirophasic diameter changes were in the normal range (>= 50%),    consistent with normal central venous pressure.   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[place])    Peripheral vascular disease ([place])    carotid to subclavian artery bypass graft   Smoker     Past Surgical History:  Procedure Laterality Date   CAROTID ARTERY - SUBCLAVIAN ARTERY BYPASS GRAFT Left 04/18/2007   COLONOSCOPY  2008   CORONARY ARTERY BYPASS GRAFT     CORONARY/GRAFT ACUTE MI REVASCULARIZATION N/A 05/12/2017   Procedure: Coronary/Graft Acute MI Revascularization;  Surgeon: Sherren Mocha, MD;  Location: [place] [facility];  Service: Cardiovascular;  Laterality: N/A;   DEBRIDEMENT MANDIBLE Left 03/23/2019   Procedure: DEBRIDEMENT LEFT MANDIBLE,SEQUESTRECTOMY;  Surgeon: Michael Litter, DMD;   Location: [place];  Service: Oral Surgery;  Laterality: Left;   EYE SURGERY Bilateral 2020   cataract removal   KIDNEY SURGERY Left    72 yo   LEFT HEART CATH AND CORONARY ANGIOGRAPHY N/A 05/12/2017   Procedure: LEFT HEART CATH AND CORONARY ANGIOGRAPHY;  Surgeon: Sherren Mocha, MD;  Location: [place] [facility];  Service: Cardiovascular;  Laterality: N/A;   Left Nephrectomy Left 10/17/1983   Dr. Sural---[place]ag[place], Left Kidney Nonfunctioning wiht Acute and Chronic Pyelonephritis   WISDOM TOOTH EXTRACTION      MEDICATIONS:  albuterol (VENTOLIN HFA) 108 (90 Base) MCG/ACT inhaler   ALPRAZolam (XANAX) 0.25 MG tablet   amLODipine (NORVASC) 10 MG tablet   aspirin 81 MG tablet   calcipotriene (DOVONOX) 0.005 % cream   carvedilol (COREG) 3.125 MG tablet   desonide (DESOWEN) 0.05 % cream   ezetimibe (ZETIA) 10 MG tablet   Fluticasone-Umeclidin-Vilant (TRELEGY ELLIPTA) 100-62.5-25 MCG/ACT AEPB   Fluticasone-Umeclidin-Vilant (TRELEGY ELLIPTA) 100-62.5-25 MCG/ACT AEPB   lisinopril (ZESTRIL) 20 MG tablet   montelukast (SINGULAIR) 10 MG tablet   Multiple Vitamins-Minerals (PRESERVISION AREDS 2) CAPS   nitroGLYCERIN (NITROSTAT) 0.4 MG SL tablet   omeprazole (PRILOSEC) 10 MG capsule   rosuvastatin (CRESTOR) 40 MG tablet   vitamin B-12 (CYANOCOBALAMIN) 100 MCG tablet   Vitamin E 400 units TABS   No current facility-administered medications for this encounter.     Konrad Felix Ward, PA-C [facility] Pre-Surgical Testing [phone]

## 2022-08-29 ENCOUNTER — Ambulatory Visit: Payer: Self-pay | Admitting: General Surgery

## 2022-08-30 ENCOUNTER — Ambulatory Visit (HOSPITAL_COMMUNITY)
Admission: RE | Admit: 2022-08-30 | Discharge: 2022-08-30 | Disposition: A | Discharge status: Home / Self Care | Payer: 59 | Source: Ambulatory Visit | Attending: General Surgery | Admitting: General Surgery

## 2022-08-30 ENCOUNTER — Other Ambulatory Visit: Payer: Self-pay

## 2022-08-30 ENCOUNTER — Ambulatory Visit (HOSPITAL_COMMUNITY): Payer: 59 | Admitting: Physician Assistant

## 2022-08-30 ENCOUNTER — Encounter (HOSPITAL_COMMUNITY): Payer: Self-pay | Admitting: General Surgery

## 2022-08-30 ENCOUNTER — Ambulatory Visit (HOSPITAL_BASED_OUTPATIENT_CLINIC_OR_DEPARTMENT_OTHER): Payer: 59 | Admitting: Certified Registered Nurse Anesthetist

## 2022-08-30 ENCOUNTER — Other Ambulatory Visit (HOSPITAL_COMMUNITY): Payer: Self-pay

## 2022-08-30 ENCOUNTER — Encounter (HOSPITAL_COMMUNITY)
Admission: RE | Disposition: A | Discharge status: Home / Self Care | Payer: Self-pay | Source: Ambulatory Visit | Attending: General Surgery

## 2022-08-30 DIAGNOSIS — K4090 Unilateral inguinal hernia, without obstruction or gangrene, not specified as recurrent: Secondary | ICD-10-CM | POA: Diagnosis not present

## 2022-08-30 DIAGNOSIS — I2510 Atherosclerotic heart disease of native coronary artery without angina pectoris: Secondary | ICD-10-CM

## 2022-08-30 DIAGNOSIS — N184 Chronic kidney disease, stage 4 (severe): Secondary | ICD-10-CM | POA: Diagnosis not present

## 2022-08-30 DIAGNOSIS — J449 Chronic obstructive pulmonary disease, unspecified: Secondary | ICD-10-CM | POA: Insufficient documentation

## 2022-08-30 DIAGNOSIS — I129 Hypertensive chronic kidney disease with stage 1 through stage 4 chronic kidney disease, or unspecified chronic kidney disease: Secondary | ICD-10-CM | POA: Diagnosis not present

## 2022-08-30 DIAGNOSIS — I10 Essential (primary) hypertension: Secondary | ICD-10-CM | POA: Diagnosis not present

## 2022-08-30 DIAGNOSIS — I252 Old myocardial infarction: Secondary | ICD-10-CM | POA: Insufficient documentation

## 2022-08-30 DIAGNOSIS — I739 Peripheral vascular disease, unspecified: Secondary | ICD-10-CM | POA: Insufficient documentation

## 2022-08-30 DIAGNOSIS — Z955 Presence of coronary angioplasty implant and graft: Secondary | ICD-10-CM | POA: Insufficient documentation

## 2022-08-30 DIAGNOSIS — K219 Gastro-esophageal reflux disease without esophagitis: Secondary | ICD-10-CM | POA: Insufficient documentation

## 2022-08-30 DIAGNOSIS — F17210 Nicotine dependence, cigarettes, uncomplicated: Secondary | ICD-10-CM | POA: Diagnosis not present

## 2022-08-30 DIAGNOSIS — J439 Emphysema, unspecified: Secondary | ICD-10-CM | POA: Diagnosis not present

## 2022-08-30 DIAGNOSIS — I255 Ischemic cardiomyopathy: Secondary | ICD-10-CM | POA: Diagnosis not present

## 2022-08-30 HISTORY — PX: XI ROBOTIC ASSISTED INGUINAL HERNIA REPAIR WITH MESH: SHX6706

## 2022-08-30 SURGERY — REPAIR, HERNIA, INGUINAL, ROBOT-ASSISTED, LAPAROSCOPIC, USING MESH
Anesthesia: General | Laterality: Left

## 2022-08-30 MED ORDER — ENSURE PRE-SURGERY PO LIQD: 296.0000 mL | Freq: Once | ORAL | Status: DC

## 2022-08-30 MED ORDER — PHENYLEPHRINE 80 MCG/ML (10ML) SYRINGE FOR IV PUSH (FOR BLOOD PRESSURE SUPPORT)
PREFILLED_SYRINGE | INTRAVENOUS | Status: AC
  Filled 2022-08-30: qty 30

## 2022-08-30 MED ORDER — AMISULPRIDE (ANTIEMETIC) 5 MG/2ML IV SOLN: 10.0000 mg | Freq: Once | INTRAVENOUS | Status: DC | PRN

## 2022-08-30 MED ORDER — OXYCODONE HCL 5 MG/5ML PO SOLN: 5.0000 mg | Freq: Once | ORAL | Status: DC | PRN

## 2022-08-30 MED ORDER — OXYCODONE HCL 5 MG PO TABS: 5.0000 mg | ORAL_TABLET | Freq: Once | ORAL | Status: DC | PRN

## 2022-08-30 MED ORDER — LACTATED RINGERS IV SOLN: INTRAVENOUS | Status: DC | PRN

## 2022-08-30 MED ORDER — DEXAMETHASONE SODIUM PHOSPHATE 10 MG/ML IJ SOLN
INTRAMUSCULAR | Status: DC | PRN
  Administered 2022-08-30: 4 mg via INTRAVENOUS

## 2022-08-30 MED ORDER — PROPOFOL 10 MG/ML IV BOLUS
INTRAVENOUS | Status: DC | PRN
  Administered 2022-08-30: 70 mg via INTRAVENOUS

## 2022-08-30 MED ORDER — CHLORHEXIDINE GLUCONATE CLOTH 2 % EX PADS: 6.0000 | MEDICATED_PAD | Freq: Once | CUTANEOUS | Status: DC

## 2022-08-30 MED ORDER — BUPIVACAINE-EPINEPHRINE 0.25% -1:200000 IJ SOLN
INTRAMUSCULAR | Status: DC | PRN
  Administered 2022-08-30: 30 mL

## 2022-08-30 MED ORDER — BUPIVACAINE-EPINEPHRINE (PF) 0.25% -1:200000 IJ SOLN
INTRAMUSCULAR | Status: AC
  Filled 2022-08-30: qty 30

## 2022-08-30 MED ORDER — ROCURONIUM BROMIDE 10 MG/ML (PF) SYRINGE
PREFILLED_SYRINGE | INTRAVENOUS | Status: DC | PRN
  Administered 2022-08-30: 60 mg via INTRAVENOUS

## 2022-08-30 MED ORDER — CHLORHEXIDINE GLUCONATE 0.12 % MT SOLN
15.0000 mL | Freq: Once | OROMUCOSAL | Status: AC
  Administered 2022-08-30: 15 mL via OROMUCOSAL

## 2022-08-30 MED ORDER — FENTANYL CITRATE (PF) 250 MCG/5ML IJ SOLN
INTRAMUSCULAR | Status: AC
  Filled 2022-08-30: qty 5

## 2022-08-30 MED ORDER — ACETAMINOPHEN 500 MG PO TABS
1000.0000 mg | ORAL_TABLET | ORAL | Status: AC
  Administered 2022-08-30: 1000 mg via ORAL
  Filled 2022-08-30: qty 2

## 2022-08-30 MED ORDER — ACETAMINOPHEN 160 MG/5ML PO SOLN: 325.0000 mg | ORAL | Status: DC | PRN

## 2022-08-30 MED ORDER — LIDOCAINE HCL (PF) 2 % IJ SOLN
INTRAMUSCULAR | Status: AC
  Filled 2022-08-30: qty 15

## 2022-08-30 MED ORDER — ONDANSETRON HCL 4 MG/2ML IJ SOLN
INTRAMUSCULAR | Status: AC
  Filled 2022-08-30: qty 4

## 2022-08-30 MED ORDER — BUPIVACAINE LIPOSOME 1.3 % IJ SUSP
INTRAMUSCULAR | Status: DC | PRN
  Administered 2022-08-30: 20 mL

## 2022-08-30 MED ORDER — SUGAMMADEX SODIUM 200 MG/2ML IV SOLN
INTRAVENOUS | Status: DC | PRN
  Administered 2022-08-30: 200 mg via INTRAVENOUS

## 2022-08-30 MED ORDER — LACTATED RINGERS IV SOLN: INTRAVENOUS | Status: DC

## 2022-08-30 MED ORDER — CEFAZOLIN SODIUM-DEXTROSE 2-4 GM/100ML-% IV SOLN
2.0000 g | INTRAVENOUS | Status: AC
  Administered 2022-08-30: 2 g via INTRAVENOUS
  Filled 2022-08-30: qty 100

## 2022-08-30 MED ORDER — ONDANSETRON HCL 4 MG/2ML IJ SOLN
INTRAMUSCULAR | Status: DC | PRN
  Administered 2022-08-30: 4 mg via INTRAVENOUS

## 2022-08-30 MED ORDER — ORAL CARE MOUTH RINSE: 15.0000 mL | Freq: Once | OROMUCOSAL | Status: AC

## 2022-08-30 MED ORDER — FENTANYL CITRATE PF 50 MCG/ML IJ SOSY: 25.0000 ug | PREFILLED_SYRINGE | INTRAMUSCULAR | Status: DC | PRN

## 2022-08-30 MED ORDER — FENTANYL CITRATE (PF) 100 MCG/2ML IJ SOLN
INTRAMUSCULAR | Status: DC | PRN
  Administered 2022-08-30 (×2): 50 ug via INTRAVENOUS

## 2022-08-30 MED ORDER — PHENYLEPHRINE 80 MCG/ML (10ML) SYRINGE FOR IV PUSH (FOR BLOOD PRESSURE SUPPORT)
PREFILLED_SYRINGE | INTRAVENOUS | Status: DC | PRN
  Administered 2022-08-30: 80 ug via INTRAVENOUS

## 2022-08-30 MED ORDER — BUPIVACAINE LIPOSOME 1.3 % IJ SUSP
INTRAMUSCULAR | Status: AC
  Filled 2022-08-30: qty 20

## 2022-08-30 MED ORDER — ACETAMINOPHEN 325 MG PO TABS: 325.0000 mg | ORAL_TABLET | ORAL | Status: DC | PRN

## 2022-08-30 MED ORDER — OXYCODONE HCL 5 MG PO TABS
5.0000 mg | ORAL_TABLET | Freq: Four times a day (QID) | ORAL | 0 refills | Status: DC | PRN
  Filled 2022-08-30: qty 12, 3d supply, fill #0

## 2022-08-30 MED ORDER — EPHEDRINE 5 MG/ML INJ
INTRAVENOUS | Status: AC
  Filled 2022-08-30: qty 5

## 2022-08-30 MED ORDER — DEXAMETHASONE SODIUM PHOSPHATE 10 MG/ML IJ SOLN
INTRAMUSCULAR | Status: AC
  Filled 2022-08-30: qty 2

## 2022-08-30 MED ORDER — PROMETHAZINE HCL 25 MG/ML IJ SOLN: 6.2500 mg | INTRAMUSCULAR | Status: DC | PRN

## 2022-08-30 MED ORDER — ROCURONIUM BROMIDE 10 MG/ML (PF) SYRINGE
PREFILLED_SYRINGE | INTRAVENOUS | Status: AC
  Filled 2022-08-30: qty 20

## 2022-08-30 MED ORDER — PROPOFOL 10 MG/ML IV BOLUS
INTRAVENOUS | Status: AC
  Filled 2022-08-30: qty 20

## 2022-08-30 MED ORDER — LIDOCAINE 2% (20 MG/ML) 5 ML SYRINGE
INTRAMUSCULAR | Status: DC | PRN
  Administered 2022-08-30: 1.5 mg/kg/h via INTRAVENOUS
  Administered 2022-08-30: 50 mg via INTRAVENOUS

## 2022-08-30 SURGICAL SUPPLY — 38 items
APL PRP STRL LF DISP 70% ISPRP (MISCELLANEOUS) ×1
APL SWBSTK 6 STRL LF DISP (MISCELLANEOUS)
APPLICATOR COTTON TIP 6 STRL (MISCELLANEOUS) IMPLANT
APPLICATOR COTTON TIP 6IN STRL (MISCELLANEOUS)
BAG COUNTER SPONGE SURGICOUNT (BAG) IMPLANT
BAG SPNG CNTER NS LX DISP (BAG)
CHLORAPREP W/TINT 26 (MISCELLANEOUS) ×1 IMPLANT
COVER SURGICAL LIGHT HANDLE (MISCELLANEOUS) ×1 IMPLANT
COVER TIP SHEARS 8 DVNC (MISCELLANEOUS) ×1 IMPLANT
COVER TIP SHEARS 8MM DA VINCI (MISCELLANEOUS) ×1
DRAPE ARM DVNC X/XI (DISPOSABLE) ×4 IMPLANT
DRAPE COLUMN DVNC XI (DISPOSABLE) ×1 IMPLANT
DRAPE DA VINCI XI ARM (DISPOSABLE) ×4
DRAPE DA VINCI XI COLUMN (DISPOSABLE) ×1
ELECT REM PT RETURN 15FT ADLT (MISCELLANEOUS) ×1 IMPLANT
GLOVE BIOGEL PI IND STRL 7.0 (GLOVE) ×2 IMPLANT
GLOVE SURG SS PI 7.0 STRL IVOR (GLOVE) ×2 IMPLANT
GOWN STRL REUS W/ TWL LRG LVL3 (GOWN DISPOSABLE) ×2 IMPLANT
GOWN STRL REUS W/ TWL XL LVL3 (GOWN DISPOSABLE) IMPLANT
GOWN STRL REUS W/TWL LRG LVL3 (GOWN DISPOSABLE) ×2
GOWN STRL REUS W/TWL XL LVL3 (GOWN DISPOSABLE)
IRRIG SUCT STRYKERFLOW 2 WTIP (MISCELLANEOUS)
IRRIGATION SUCT STRKRFLW 2 WTP (MISCELLANEOUS) IMPLANT
KIT BASIN OR (CUSTOM PROCEDURE TRAY) ×1 IMPLANT
KIT TURNOVER KIT A (KITS) IMPLANT
MARKER SKIN DUAL TIP RULER LAB (MISCELLANEOUS) ×1 IMPLANT
MESH 3DMAX MID 4X6 LT LRG (Mesh General) IMPLANT
SEAL UNIV 5-12 XI (MISCELLANEOUS) ×3 IMPLANT
SEAL XI UNIVERSAL 5-12 (MISCELLANEOUS) ×3
SET TUBE SMOKE EVAC HIGH FLOW (TUBING) ×1 IMPLANT
SPIKE FLUID TRANSFER (MISCELLANEOUS) ×1 IMPLANT
SUT MNCRL AB 4-0 PS2 18 (SUTURE) ×1 IMPLANT
SUT VIC AB 2-0 SH 27 (SUTURE) ×1
SUT VIC AB 2-0 SH 27X BRD (SUTURE) ×1 IMPLANT
SUT VLOC 3-0 9IN GRN (SUTURE) ×1 IMPLANT
TOWEL OR 17X26 10 PK STRL BLUE (TOWEL DISPOSABLE) ×1 IMPLANT
TRAY LAPAROSCOPIC (CUSTOM PROCEDURE TRAY) ×1 IMPLANT
TROCAR Z-THREAD OPTICAL 5X100M (TROCAR) ×1 IMPLANT

## 2022-08-30 NOTE — Anesthesia Procedure Notes (Signed)
Procedure Name: Intubation Date/Time: 08/30/2022 11:31 AM  Performed by: Gean Maidens, CRNAPre-anesthesia Checklist: Patient identified, Emergency Drugs available, Suction available, Patient being monitored and Timeout performed Patient Re-evaluated:Patient Re-evaluated prior to induction Oxygen Delivery Method: Circle system utilized Preoxygenation: Pre-oxygenation with 100% oxygen Induction Type: IV induction Ventilation: Mask ventilation without difficulty Laryngoscope Size: Mac and 4 Grade View: Grade I Tube type: Oral Tube size: 7.0 mm Number of attempts: 1 Airway Equipment and Method: Stylet Placement Confirmation: ETT inserted through vocal cords under direct vision, positive ETCO2 and breath sounds checked- equal and bilateral Secured at: 21 cm Tube secured with: Tape Dental Injury: Teeth and Oropharynx as per pre-operative assessment

## 2022-08-30 NOTE — Discharge Instructions (Signed)
[facility] _______[facility]  UMBILICAL OR INGUINAL HERNIA REPAIR: POST OP INSTRUCTIONS  Always review your discharge instruction sheet given to you by the facility where your surgery was performed. IF YOU HAVE DISABILITY OR FAMILY LEAVE FORMS, YOU MUST BRING THEM TO THE OFFICE FOR PROCESSING.   DO NOT GIVE THEM TO YOUR DOCTOR.  1. A  prescription for pain medication may be given to you upon discharge.  Take your pain medication as prescribed, if needed.  If narcotic pain medicine is not needed, then you may take acetaminophen (Tylenol) or ibuprofen (Advil) as needed. 2. Take your usually prescribed medications unless otherwise directed. If you need a refill on your pain medication, please contact your pharmacy.  They will contact our office to request authorization. Prescriptions will not be filled after 5 pm or on week-ends. 3. You should follow a light diet the first 24 hours after arrival home, such as soup and crackers, etc.  Be sure to include lots of fluids daily.  Resume your normal diet the day after surgery. 4.Most patients will experience some swelling and bruising around the umbilicus or in the groin and scrotum.  Ice packs and reclining will help.  Swelling and bruising can take several days to resolve.  6. It is common to experience some constipation if taking pain medication after surgery.  Increasing fluid intake and taking a stool softener (such as Colace) will usually help or prevent this problem from occurring.  A mild laxative (Milk of Magnesia or Miralax) should be taken according to package directions if there are no bowel movements after 48 hours. 7. Unless discharge instructions indicate otherwise, you may remove your bandages 24-48 hours after surgery, and you may shower at that time.  You may have steri-strips (small skin tapes) in place directly over the incision.  These strips should be left on the skin for 7-10 days.  If your surgeon used skin glue on the  incision, you may shower in 24 hours.  The glue will flake off over the next 2-3 weeks.  Any sutures or staples will be removed at the office during your follow-up visit. 8. ACTIVITIES:  You may resume regular (light) daily activities beginning the next day--such as daily self-care, walking, climbing stairs--gradually increasing activities as tolerated.  You may have sexual intercourse when it is comfortable.  Refrain from any heavy lifting or straining until approved by your doctor.  a.You may drive when you are no longer taking prescription pain medication, you can comfortably wear a seatbelt, and you can safely maneuver your car and apply brakes. b.RETURN TO WORK:   _____________________________________________  9.You should see your doctor in the office for a follow-up appointment approximately 2-3 weeks after your surgery.  Make sure that you call for this appointment within a day or two after you arrive home to insure a convenient appointment time. 10.OTHER INSTRUCTIONS: _________________________    _____________________________________  WHEN TO CALL YOUR DOCTOR: Fever over 101.0 Inability to urinate Nausea and/or vomiting Extreme swelling or bruising Continued bleeding from incision. Increased pain, redness, or drainage from the incision  The clinic staff is available to answer your questions during regular business hours.  Please don't hesitate to call and ask to speak to one of the nurses for clinical concerns.  If you have a medical emergency, go to the nearest emergency room or call 911.  A surgeon from [facility] is always on call at the hospital   [address], [address],   [place], [place]  [address] ?  [address] [address], [place], [place]   [address] [phone] ? [phone] ? FAX [phone] Web site: [email or web address]  

## 2022-08-30 NOTE — Op Note (Signed)
Preop diagnosis: left inguinal hernia  Postop diagnosis: left indirect and direct inguinal hernia, developing left obturator hernia  Procedure: Robotic  Left inguinal hernia repair with mesh  Surgeon: Gurney Maxin, M.D.  Asst: none  Anesthesia: Gen.   Indications for procedure: [name] is a 72 y.o. female with symptoms of pain and enlarging Left inguinal hernia(s). After discussing risks, alternatives and benefits she decided on robotic repair and was brought to day surgery for repair.  Description of procedure: The patient was brought into the operative suite, placed supine. Anesthesia was administered with endotracheal tube. Patient was strapped in place. The patient was prepped and draped in the usual sterile fashion.  A small transverse incision was made just left of midline about 20 cm cephalad of the pubic symphysis. A 75mm trocar was used to gain access to the peritoneal cavity by optical entry technique. Pneumoperitoneum was applied with a high flow and low pressure. The laparoscope was reinserted to confirm position.  Next Exparel:Marcaine mix was used for bilateral TAP blocks. 1 8 mm trocar was placed in the right mid abdomen. 1 8 mm trocar was placed in the left mid abdomen.  The 5 mm trocar was upsized to an 8 mm trocar. The patient was placed in trendelenberg position. The robot was docked.  On initial visualization , there was a moderate indirect left inguinal hernia and small direct left inguinal hernia. A peritoneal flap was created on the left. This was continued medially to the pubic bone medially and laterally to muscles. Hernia sac was completely dissected out of the canal. The round ligament was divided with cautery to fully remove contents from the hernia. During the dissection near the pelvic brim there was a piece of fat going into the obturator canal that was reduced to avoid obturator hernia.  A large left mid weight 3D max mesh was inserted and sutured with  2-0 vicryl medially to the lacunar ligament. The mesh covered the obturator space as well. The mesh was positioned flat and directly up against the direct and indirect areas. The peritoneal flap was sewn back up with running 3-0 v-loc suture.  The CO2 was evacuated while watching to ensure the mesh did not migrate. All skin incisions were closed with 4-0 monocryl subcu stitch. The patient awoke from anesthesia and was brought to PACU in stable condition.  Findings: left direct and indirect inguinal hernia, developing obturator hernia  Specimen: none  Blood loss: 10 ml  Local anesthesia: 50 ml Exparel:Marcaine mix  Complications: none  Implant: left large mid weight Bard 3D max mesh  Gurney Maxin, M.D. General, Bariatric, & Minimally Invasive Surgery [facility] [place] 12:29 PM 08/30/2022

## 2022-08-30 NOTE — Transfer of Care (Signed)
Immediate Anesthesia Transfer of Care Note  Patient: [name]  Procedure(s) Performed: ROBOTIC LEFT INGUINAL HERNIA REPAIR WITH MESH (Left)  Patient Location: PACU  Anesthesia Type:General  Level of Consciousness: awake, alert , and oriented  Airway & Oxygen Therapy: Patient Spontanous Breathing and Patient connected to face mask oxygen  Post-op Assessment: Report given to RN and Post -op Vital signs reviewed and stable  Post vital signs: Reviewed and stable  Last Vitals:  Vitals Value Taken Time  BP 159/88 08/30/22 1246  Temp    Pulse 76 08/30/22 1247  Resp 17 08/30/22 1247  SpO2 100 % 08/30/22 1247  Vitals shown include unvalidated device data.  Last Pain:  Vitals:   08/30/22 0910  TempSrc:   PainSc: 0-No pain         Complications: No notable events documented.

## 2022-08-30 NOTE — Anesthesia Postprocedure Evaluation (Signed)
Anesthesia Post Note  Patient: [name]  Procedure(s) Performed: ROBOTIC LEFT INGUINAL HERNIA REPAIR WITH MESH (Left)     Patient location during evaluation: PACU Anesthesia Type: General Level of consciousness: awake and alert Pain management: pain level controlled Vital Signs Assessment: post-procedure vital signs reviewed and stable Respiratory status: spontaneous breathing, nonlabored ventilation, respiratory function stable and patient connected to nasal cannula oxygen Cardiovascular status: blood pressure returned to baseline and stable Postop Assessment: no apparent nausea or vomiting Anesthetic complications: no  No notable events documented.  Last Vitals:  Vitals:   08/30/22 1330 08/30/22 1343  BP: (!) 197/86 (!) 164/93  Pulse: 71   Resp:    Temp: 36.4 C   SpO2: 92%     Last Pain:  Vitals:   08/30/22 1330  TempSrc: Oral  PainSc:                  Effie Berkshire

## 2022-08-30 NOTE — H&P (Signed)
Chief Complaint: New Consultation   History of Present Illness: [name] is a 72 y.o. female who is seen today as an office consultation at the request of Dr. Jacalyn Lefevre for evaluation of New Consultation .  She first noticed the hernia 2 years ago. Symptoms are pain and swelling in the left groin with activity. She denies nausea or vomiting or bowel habit change.  She does smoke She does not have diabetes She has no history of hernias  Review of Systems: A complete review of systems was obtained from the patient. I have reviewed this information and discussed as appropriate with the patient. See HPI as well for other ROS.  Review of Systems Constitutional: Negative. HENT: Negative. Eyes: Negative. Respiratory: Negative. Cardiovascular: Negative. Gastrointestinal: Positive for abdominal pain. Genitourinary: Negative. Musculoskeletal: Negative. Skin: Negative. Neurological: Negative. Endo/Heme/Allergies: Negative. Psychiatric/Behavioral: Negative.  Medical History: Past Medical History: Diagnosis Date Chronic kidney disease COPD (chronic obstructive pulmonary disease) (CMS-HCC) Heart valve disease  There is no problem list on file for this patient.  No past surgical history on file. Allergies Allergen Reactions Bisphosphonates Other (See Comments) Osteonecrosis Jaw  Current Outpatient Medications on File Prior to Visit Medication Sig Dispense Refill amLODIPine (NORVASC) 10 MG tablet Take 10 mg by mouth once daily carvediloL (COREG) 3.125 MG tablet Take 3.125 mg by mouth 2 (two) times daily with meals montelukast (SINGULAIR) 10 mg tablet Take 1 tablet by mouth at bedtime rosuvastatin (CRESTOR) 40 MG tablet TAKE 1 TABLET BY MOUTH EVERY DAY AT BEDTIME FOR CHOLESTEROL  No current facility-administered medications on file prior to visit.  No family history on file. Social History  Tobacco Use Smoking Status Never Smokeless Tobacco Never  Social  History  Socioeconomic History Marital status: Married Tobacco Use Smoking status: Never Smokeless tobacco: Never Substance and Sexual Activity Alcohol use: Not Currently Drug use: Never  Objective:  Vitals: 06/19/22 1011 BP: (!) 140/84 Pulse: 84 Temp: 36.9 C (98.4 F) SpO2: (!) 90% Weight: 45.8 kg (101 lb) Height: 170.2 cm (5\' 7" )  Body mass index is 15.82 kg/m.  Physical Exam Constitutional: Appearance: Normal appearance. HENT: Head: Normocephalic and atraumatic. Pulmonary: Effort: Pulmonary effort is normal. Abdominal: Comments: Moderate left inguinal hernia Musculoskeletal: General: Normal range of motion. Cervical back: Normal range of motion. Neurological: General: No focal deficit present. Mental Status: She is alert and oriented to person, place, and time. Mental status is at baseline. Psychiatric: Mood and Affect: Mood normal. Behavior: Behavior normal. Thought Content: Thought content normal.  Labs, Imaging and Diagnostic Testing: I reviewed notes by Cristie Hem  Assessment and Plan: Diagnoses and all orders for this visit:  Ischemic cardiomyopathy  Peripheral vascular disease (CMS-HCC)  CKD (chronic kidney disease) stage 4, GFR 15-29 ml/min (CMS-HCC)  Pulmonary emphysema, unspecified emphysema type (CMS-HCC)  Tobacco abuse  We discussed etiology of hernias and how they can cause pain. We discussed options for inguinal hernia repair vs observation. We discussed details of the surgery of general anesthesia, surgical approach and incisions, dissecting the sack away from structures in the area and nerves and placement of mesh. We discussed risks of bleeding, infection, recurrence, injury to structures in the area, nerve injury, and chronic pain. She showed good understanding and wanted proceed with minimally invasive left inguinal hernia repair as outpatient.

## 2022-09-01 ENCOUNTER — Encounter (HOSPITAL_COMMUNITY): Payer: Self-pay | Admitting: General Surgery

## 2022-09-23 ENCOUNTER — Telehealth: Payer: Self-pay | Admitting: Cardiology

## 2022-09-23 NOTE — Telephone Encounter (Signed)
Patient states no SOB at this time.  This has been ongoing for approx 1-2 weeks.  She states when walking her legs start to hurt and needs to sit down and is "gasping for air".  She states some lightheadedness with it as well.  Otherwise no SOB, none with conversation or normal activity. No chest pain, no edema, no other symptoms. She is scheduled with NP for 4/19

## 2022-09-23 NOTE — Telephone Encounter (Signed)
Pt c/o Shortness Of Breath: STAT if SOB developed within the last 24 hours or pt is noticeably SOB on the phone  1. Are you currently SOB (can you hear that pt is SOB on the phone)? No, but pt stated she was a few mins ago.   2. How long have you been experiencing SOB? Past couple weeks   3. Are you SOB when sitting or when up moving around? Moving around   4. Are you currently experiencing any other symptoms? Dizziness and lightheadedness

## 2022-10-03 NOTE — Progress Notes (Addendum)
Cardiology Clinic Note   Date: 10/04/2022 ID: WYNN KERNES, DOB 08-30-1950, MRN [id number]  Primary Cardiologist:  Rollene Rotunda, MD  Patient Profile    [name] is a 72 y.o. female who presents to the clinic today for evaluation of dyspnea on exertion and lightheadedness.  Past medical history significant for: CAD. CABG x 1 1996: LIMA to LAD. LHC 05/12/2017 heart disease STEMI): Severe mid LAD stenosis.  Severe ramus intermedius stenosis.  Patent LIMA, LCx, and RCA.  PCI with DES to mid LAD just beyond the LIMA insertion site.  PCI with DES to ramus intermedius. Ischemic cardiomyopathy. Echo 05/13/2017: EF 45 to 50%.  Distal anterior, apical, apical septal and inferoapical hypokinesis suggestive of LAD territory ischemia/infarct.  Grade I DD.  Trivial MR. Carotid artery disease/subclavian artery stenosis. RCA aortogram/left subclavian artery angiogram 04/13/2007: Unsuccessful PTCA. Carotid ultrasound 02/13/2021: Bilateral ICA 1 to 39%.  Right vertebral artery demonstrates antegrade flow.  Left vertebral artery demonstrates retrograde flow.  Normal flow hemodynamics bilateral subclavian arteries. PVD. Hypertension. Hyperlipidemia. Lipid panel 11/08/2021: LDL 54, HDL 58, TG 75, total 127. COPD. Tobacco abuse. Single kidney.   History of Present Illness    [name] was first evaluated by Dr. Antoine Poche on 03/01/2015 for CAD.  She is followed for the above outlined history.  Patient was last seen in the office by Edd Fabian, NP on 07/01/2022 for routine follow-up and preoperative clearance evaluation for inguinal hernia repair.  Patient was doing well at that time and was found to be an acceptable risk for surgery.  More recently, patient called into triage on 09/23/2022 with a 1 to 2-week history of DOE and lightheadedness scheduled for an office visit. She is s/p hernia repair on 08/30/2022.  Today, patient reports an almost 2 week history of DOE with little  exertion that does not respond to inhaler but improves with 15 minutes of rest.  She denies associated chest pain but does report lightheadedness. No lower extremity edema, orthopnea or PND. She also reports bilateral claudication with pain, burning and weakness of bilateral calves that will extend up to the back of her legs with walking and resolve with rest. This is new with patient stating "I've never had anything wrong with my legs." She is hypertensive today , BP 164/92 at intake and 150/90 at recheck. Home BP typically mid 140-150s/90. She denies headaches. She continues to smoke 1/2 pack a day. 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 She has been released back to work from surgery.    ROS: All other systems reviewed and are otherwise negative except as noted in History of Present Illness.  Studies Reviewed    ECG is not ordered today.   Risk Assessment/Calculations      HYPERTENSION CONTROL Vitals:   10/04/22 0805 10/04/22 1337  BP: (!) 164/92 (!) 150/90    The patient's blood pressure is elevated above target today.  In order to address the patient's elevated BP: A new medication was prescribed today.          Physical Exam    VS:  BP (!) 164/92   Pulse 73   Ht  (1.702 m)   Wt 99 lb 9.6 oz (45.2 kg)   LMP  (LMP Unknown)   SpO2 93%   BMI 15.60 kg/m  , BMI Body mass index is 15.6 kg/m.  GEN: Well nourished, well developed, in no acute distress. Neck: No JVD or carotid bruits.  Cardiac:  RRR. No murmurs. No rubs or gallops.   Respiratory:  Respirations regular and unlabored. Diminished breath sounds bilaterally without rales, wheezing or rhonchi. GI: Soft, nontender, nondistended. Extremities: Radials/DP/PT 2+ and equal bilaterally. No clubbing or cyanosis. No edema.  Skin: Warm and dry, no rash. Neuro: Strength intact.  Assessment & Plan   DOE/ischemic cardiomyopathy.  Echo November 2018 showed EF 45 to 50%, hypokinesis, Grade I DD.  Patient reports an almost 2 week history of DOE with little exertion that does not respond to inhaler but improves with 15 minutes of rest. Associated lightheadedness. No chest pain, lower extremity edema, orthopnea or PND.  Euvolemic and well compensated on exam.  Will get an echo for further evaluation. Changing to olmesartan (see #3).  CAD.  S/p CABG x 1 1996.  PCI with DES to mid LAD and DES to ramus intermedius November 2018.  Patient denies chest pain, pressure or tightness. Continue aspirin, carvedilol, rosuvastatin, Zetia, as needed SL NTG. Hypertension.  BP today 164/92 at intake and 150/90 at recheck. Patient reports lightheadedness that may be related to DOE. No headaches. Home BP typically mid 140-150s/90s. Stop lisinopril and change to olmesartan 20 mg daily. BP log provided.  Continue amlodipine, carvedilol. Claudication. Patient reports pain, burning and weakness of bilateral calves that sometimes radiates up back of legs with walking and resolves with rest. She states she has never had issues with her legs before. She works at an [employer] and is on her feet frequently. She has been out of work since her hernia surgery on 08/30/2022 and was recently released to return to work. She is concerned about being able to perform her job. She is going to try. If she decides she cannot return to work I think it is reasonable to write her out until she follows up. Will get ABI/Arterial US bilateral lower extremities.  Hyperlipidemia.  LDL May 2023 54, at goal.  Continue rosuvastatin and Zetia. Tobacco abuse. Patient continues to some 1/2 pack a day. This is decreased from 1 pack per day. She is congratulated on her effort and encouraged to continue decreasing smoke to complete cessation.   Disposition: Echo. ABI/arterial US. Stop lisinopril and start Olmesartan 20 mg daily. Return in 4-6 weeks after testing or sooner as needed.          Signed, Etta Grandchild. Aldrick Derrig, DNP, NP-C

## 2022-10-04 ENCOUNTER — Ambulatory Visit: Payer: Medicare Other | Attending: Student | Admitting: Student

## 2022-10-04 ENCOUNTER — Encounter: Payer: Self-pay | Admitting: Student

## 2022-10-04 VITALS — BP 150/90 | HR 73 | Ht 67.0 in | Wt 99.6 lb

## 2022-10-04 DIAGNOSIS — I10 Essential (primary) hypertension: Secondary | ICD-10-CM

## 2022-10-04 DIAGNOSIS — I2510 Atherosclerotic heart disease of native coronary artery without angina pectoris: Secondary | ICD-10-CM

## 2022-10-04 DIAGNOSIS — Z9861 Coronary angioplasty status: Secondary | ICD-10-CM

## 2022-10-04 DIAGNOSIS — Z720 Tobacco use: Secondary | ICD-10-CM

## 2022-10-04 DIAGNOSIS — I739 Peripheral vascular disease, unspecified: Secondary | ICD-10-CM

## 2022-10-04 DIAGNOSIS — R0609 Other forms of dyspnea: Secondary | ICD-10-CM | POA: Diagnosis not present

## 2022-10-04 DIAGNOSIS — E785 Hyperlipidemia, unspecified: Secondary | ICD-10-CM

## 2022-10-04 MED ORDER — OLMESARTAN MEDOXOMIL 20 MG PO TABS: 20.0000 mg | ORAL_TABLET | Freq: Every day | ORAL | 3 refills | Status: DC

## 2022-10-04 NOTE — Patient Instructions (Addendum)
Medication Instructions:  Your physician has recommended you make the following change in your medication:  STOP: Lisinopril  START: Olmesartan  daily.  *If you need a refill on your cardiac medications before your next appointment, please call your pharmacy*  Please take your blood pressure daily and include heart rates.   HOW TO TAKE YOUR BLOOD PRESSURE: Rest 5 minutes before taking your blood pressure. Don't smoke or drink caffeinated beverages for at least 30 minutes before. Take your blood pressure before (not after) you eat. Sit comfortably with your back supported and both feet on the floor (don't cross your legs). Elevate your arm to heart level on a table or a desk. Use the proper sized cuff. It should fit smoothly and snugly around your bare upper arm. There should be enough room to slip a fingertip under the cuff. The bottom edge of the cuff should be 1 inch above the crease of the elbow. Ideally, take 3 measurements at one sitting and record the average.    Lab Work: NONE If you have labs (blood work) drawn today and your tests are completely normal, you will receive your results only by: MyChart Message (if you have MyChart) OR A paper copy in the mail If you have any lab test that is abnormal or we need to change your treatment, we will call you to review the results.   Testing/Procedures: Your physician has requested that you have an echocardiogram. Echocardiography is a painless test that uses sound waves to create images of your heart. It provides your doctor with information about the size and shape of your heart and how well your heart's chambers and valves are working. This procedure takes approximately one hour. There are no restrictions for this procedure. Please do NOT wear cologne, perfume, aftershave, or lotions (deodorant is allowed). Please arrive 15 minutes prior to your appointment time.  Your physician has requested that you have an ankle brachial  index (ABI). During this test an ultrasound and blood pressure cuff are used to evaluate the arteries that supply the arms and legs with blood. Allow thirty minutes for this exam. There are no restrictions or special instructions.  Your physician has requested that you have a lower extremity arterial duplex. This test is an ultrasound of the arteries in the legs. It looks at arterial blood flow in the legs and arms. Allow one hour for Lower and Upper Arterial scans. There are no restrictions or special instructions      Follow-Up: At [facility], you and your health needs are our priority.  As part of our continuing mission to provide you with exceptional heart care, we have created designated Provider Care Teams.  These Care Teams include your primary Cardiologist (physician) and Advanced Practice Providers (APPs -  Physician Assistants and Nurse Practitioners) who all work together to provide you with the care you need, when you need it.  We recommend signing up for the patient portal called "MyChart".  Sign up information is provided on this After Visit Summary.  MyChart is used to connect with patients for Virtual Visits (Telemedicine).  Patients are able to view lab/test results, encounter notes, upcoming appointments, etc.  Non-urgent messages can be sent to your provider as well.   To learn more about what you can do with MyChart, go to [email or web address].    Your next appointment:   4-6 week(s) please make sure this appointment is after the patient have all of her testing done.   Provider:  Carlos Levering, NP

## 2022-10-08 ENCOUNTER — Ambulatory Visit (HOSPITAL_COMMUNITY): Payer: 59 | Attending: Student

## 2022-10-08 DIAGNOSIS — R0609 Other forms of dyspnea: Secondary | ICD-10-CM | POA: Diagnosis present

## 2022-10-08 LAB — ECHOCARDIOGRAM COMPLETE
Area-P 1/2: 8.72 cm2
P 1/2 time: 658 msec
S' Lateral: 2.2 cm

## 2022-10-09 ENCOUNTER — Telehealth: Payer: Self-pay | Admitting: Cardiology

## 2022-10-09 NOTE — Telephone Encounter (Signed)
Patient is returning call in regard to results. Requesting return call.  

## 2022-10-09 NOTE — Telephone Encounter (Signed)
Returned call to patient she is aware of echo results. No questions at this time. She verbalized understanding.

## 2022-10-14 ENCOUNTER — Telehealth: Payer: Self-pay | Admitting: Cardiology

## 2022-10-14 NOTE — Telephone Encounter (Signed)
Carlos Levering, NP 10/09/2022  1:29 PM EDT     Please let patient know echo showed normal pumping function of her heart. Heart muscle is a little stiff but that is not unexpected given age and history of hypertension. Pulmonary artery pressure is slightly elevated which is not unusual with COPD. No significant valve abnormalities. This is reassuring that dyspnea is not coming from a structural issue of the heart.   Thank you!   DW     Left message to call back

## 2022-10-14 NOTE — Telephone Encounter (Signed)
Patient was returning call. Please advise ?

## 2022-10-15 NOTE — Telephone Encounter (Signed)
Patient giving results.  She thanks [place] for the information

## 2022-10-16 ENCOUNTER — Ambulatory Visit (HOSPITAL_COMMUNITY): Admission: RE | Admit: 2022-10-16 | Payer: Medicare Other | Source: Ambulatory Visit

## 2022-11-01 ENCOUNTER — Ambulatory Visit: Payer: Medicare Other | Admitting: Student

## 2022-11-04 ENCOUNTER — Ambulatory Visit (HOSPITAL_COMMUNITY)
Admission: RE | Admit: 2022-11-04 | Discharge: 2022-11-04 | Disposition: A | Discharge status: Home / Self Care | Payer: 59 | Source: Ambulatory Visit | Attending: Student | Admitting: Student

## 2022-11-04 DIAGNOSIS — I739 Peripheral vascular disease, unspecified: Secondary | ICD-10-CM | POA: Diagnosis present

## 2022-11-05 LAB — VAS US ABI WITH/WO TBI
Left ABI: 0.69
Right ABI: 0.66

## 2022-11-13 ENCOUNTER — Other Ambulatory Visit: Payer: Self-pay

## 2022-11-13 ENCOUNTER — Encounter: Payer: Self-pay | Admitting: Cardiovascular Disease

## 2022-11-13 ENCOUNTER — Ambulatory Visit: Payer: Medicare Other | Attending: Cardiovascular Disease | Admitting: Cardiovascular Disease

## 2022-11-13 VITALS — BP 132/76 | HR 74 | Ht 67.0 in | Wt 99.6 lb

## 2022-11-13 DIAGNOSIS — I739 Peripheral vascular disease, unspecified: Secondary | ICD-10-CM | POA: Diagnosis not present

## 2022-11-13 MED ORDER — SODIUM CHLORIDE 0.9% FLUSH
3.0000 mL | Freq: Two times a day (BID) | INTRAVENOUS | Status: DC
Start: 2022-11-13 — End: 2022-12-18

## 2022-11-13 NOTE — Assessment & Plan Note (Signed)
[name] was referred to me for evaluation of PAD.  She did have moderate SFA disease by duplex May 2018.  Her most recent Doppler study performed 11/04/2022 revealed a right ABI of 0.66, left ABI 0.69 with occluded SFAs bilaterally.  She does have lifestyle-limiting claudication.  She wishes to proceed with outpatient peripheral angiography and potential endovascular therapy.

## 2022-11-13 NOTE — Progress Notes (Signed)
11/13/2022 [name]   06/29/1950  [id number]  Primary Physician Nadene Rubins Nyoka Cowden, MD Primary Cardiologist: Runell Gess MD Nicholes Calamity, [place]  HPI:  [name] is a 72 y.o. thin-appearing single Caucasian female mother of 2, grandmother of 2 grandchildren who works at [employer].  She was referred to me by Carlos Levering , NP for evaluation of PAD.  I apparently took care of her many years ago when I was part of [facility].  Her risk factors include ongoing tobacco abuse of 1/2 pack/day for the last 50+ years, treated hypertension and hyperlipidemia.  She had bypass grafting x 1 in 1996 by Dr. Remonia Richter with a LIMA to her LAD.  She had a STEMI 05/12/2017 and had PCI and drug-eluting stenting to the mid LAD just beyond LIMA insertion as well as a DES stent to the ramus intermedius branch.  Her EF is in the 40 to 50% range.  She also has left subclavian artery stenosis.  She is complain of lifestyle-limiting claudication at less than 1 block which is fairly symmetric.  Lower extremity arterial Doppler studies performed 11/04/2022 revealed ABIs in the 0.6 range with occluded SFAs bilaterally.  Current Meds  Medication Sig   albuterol (VENTOLIN HFA) 108 (90 Base) MCG/ACT inhaler Inhale 1-2 puffs into the lungs every 6 (six) hours as needed for wheezing or shortness of breath.   ALPRAZolam (XANAX) 0.25 MG tablet TAKE 1 TABLET BY MOUTH EVERY DAY (Patient taking differently: Take 0.25 mg by mouth daily as needed for anxiety.)   amLODipine (NORVASC) 10 MG tablet Take 1 tablet (10 mg total) by mouth daily.   aspirin 81 MG tablet Take 81 mg by mouth daily.   calcipotriene (DOVONOX) 0.005 % cream Apply 1 Application topically daily as needed (top of foot).   carvedilol (COREG) 3.125 MG tablet Take 1 tablet (3.125 mg total) by mouth 2 (two) times daily with a meal.   desonide (DESOWEN) 0.05 % cream Apply 1 Application topically daily as needed (head).   ezetimibe (ZETIA) 10  MG tablet Take 1 tablet (10 mg total) by mouth daily. For cholesterol   Fluticasone-Umeclidin-Vilant (TRELEGY ELLIPTA) 100-62.5-25 MCG/ACT AEPB Inhale 1 puff into the lungs daily.   Fluticasone-Umeclidin-Vilant (TRELEGY ELLIPTA) 100-62.5-25 MCG/ACT AEPB Inhale 1 puff into the lungs daily.   montelukast (SINGULAIR) 10 MG tablet TAKE 1 TABLET BY MOUTH EVERYDAY AT BEDTIME   Multiple Vitamins-Minerals (PRESERVISION AREDS 2) CAPS Take 1 capsule by mouth daily.   nitroGLYCERIN (NITROSTAT) 0.4 MG SL tablet PLACE 1 TABLET ON THE TONGUE &LET DISSOLVE EVERY 5 MINS AS NEEDED FOR CHEST PAIN   olmesartan (BENICAR) 20 MG tablet Take 1 tablet (20 mg total) by mouth daily.   omeprazole (PRILOSEC) 10 MG capsule TAKE 1 CAPSULE (10 MG TOTAL) BY MOUTH DAILY AS NEEDED.   oxyCODONE (OXY IR/ROXICODONE) 5 MG immediate release tablet Take 1 tablet (5 mg total) by mouth every 6 (six) hours as needed for severe pain.   rosuvastatin (CRESTOR) 40 MG tablet Take 1 tablet (40 mg total) by mouth daily. NEED APPOINTMENT   vitamin B-12 (CYANOCOBALAMIN) 100 MCG tablet Take 100 mcg by mouth daily.   Vitamin E 400 units TABS Take 400 Units by mouth daily.     Allergies  Allergen Reactions   Bisphosphonates Other (See Comments)    Osteonecrosis Jaw     Social History   Socioeconomic History   Marital status: Married    Spouse name: Not on file  Number of children: 2   Years of education: Not on file   Highest education level: Not on file  Occupational History   Not on file  Tobacco Use   Smoking status: Every Day    Packs/day: 0.50    Years: 40.00    Additional pack years: 0.00    Total pack years: 20.00    Types: Cigarettes   Smokeless tobacco: Never  Vaping Use   Vaping Use: Never used  Substance and Sexual Activity   Alcohol use: Yes    Comment: Occasional Beer   Drug use: No   Sexual activity: Yes    Birth control/protection: Post-menopausal  Other Topics Concern   Not on file  Social History  Narrative   Lives alone.     Social Determinants of Health   Financial Resource Strain: Not on file  Food Insecurity: Not on file  Transportation Needs: Not on file  Physical Activity: Not on file  Stress: Not on file  Social Connections: Not on file  Intimate Partner Violence: Not on file     Review of Systems: General: negative for chills, fever, night sweats or weight changes.  Cardiovascular: negative for chest pain, dyspnea on exertion, edema, orthopnea, palpitations, paroxysmal nocturnal dyspnea or shortness of breath Dermatological: negative for rash Respiratory: negative for cough or wheezing Urologic: negative for hematuria Abdominal: negative for nausea, vomiting, diarrhea, bright red blood per rectum, melena, or hematemesis Neurologic: negative for visual changes, syncope, or dizziness All other systems reviewed and are otherwise negative except as noted above.    Blood pressure 132/76, pulse 74, height 5\' 7"  (1.702 m), weight 99 lb 9.6 oz (45.2 kg), SpO2 99 %.  General appearance: alert and no distress Neck: no adenopathy, no JVD, supple, symmetrical, trachea midline, thyroid not enlarged, symmetric, no tenderness/mass/nodules, and left carotid bruit Lungs: clear to auscultation bilaterally Heart: regular rate and rhythm, S1, S2 normal, no murmur, click, rub or gallop Extremities: extremities normal, atraumatic, no cyanosis or edema Pulses: Absent pedal pulses Skin: Skin color, texture, turgor normal. No rashes or lesions Neurologic: Grossly normal  EKG not performed today  ASSESSMENT AND PLAN:   PVD (peripheral vascular disease) (HCC) [name] was referred to me for evaluation of PAD.  She did have moderate SFA disease by duplex May 2018.  Her most recent Doppler study performed 11/04/2022 revealed a right ABI of 0.66, left ABI 0.69 with occluded SFAs bilaterally.  She does have lifestyle-limiting claudication.  She wishes to proceed with outpatient peripheral  angiography and potential endovascular therapy.     Runell Gess MD FACP,FACC,FAHA, [facility] 11/13/2022 4:21 PM

## 2022-11-13 NOTE — Patient Instructions (Signed)
Medication Instructions:  Your physician recommends that you continue on your current medications as directed. Please refer to the Current Medication list given to you today.  *If you need a refill on your cardiac medications before your next appointment, please call your pharmacy*   Lab Work: Your physician recommends that you have labs drawn today: BMET & CBC  If you have labs (blood work) drawn today and your tests are completely normal, you will receive your results only by: MyChart Message (if you have MyChart) OR A paper copy in the mail If you have any lab test that is abnormal or we need to change your treatment, we will call you to review the results.   Testing/Procedures: Your physician has requested that you have a lower extremity arterial duplex. During this test, ultrasound is used to evaluate arterial blood flow in the legs. Allow one hour for this exam. There are no restrictions or special instructions. This will take place at [address], [address]. To be done 1-2 weeks after your procedure (6/6)  Your physician has requested that you have an ankle brachial index (ABI). During this test an ultrasound and blood pressure cuff are used to evaluate the arteries that supply the arms and legs with blood. Allow thirty minutes for this exam. There are no restrictions or special instructions. This will take place at [address], [address]. To be done 1-2 weeks after your procedure (6/6)     Follow-Up: At [facility], you and your health needs are our priority.  As part of our continuing mission to provide you with exceptional heart care, we have created designated Provider Care Teams.  These Care Teams include your primary Cardiologist (physician) and Advanced Practice Providers (APPs -  Physician Assistants and Nurse Practitioners) who all work together to provide you with the care you need, when you need it.  We recommend signing up for the patient portal  called "MyChart".  Sign up information is provided on this After Visit Summary.  MyChart is used to connect with patients for Virtual Visits (Telemedicine).  Patients are able to view lab/test results, encounter notes, upcoming appointments, etc.  Non-urgent messages can be sent to your provider as well.   To learn more about what you can do with MyChart, go to [email or web address].    Your next appointment:   2-3 week(s) after your procedure (6/6)  Provider:   Nanetta Batty, MD   Other Instructions       Cardiac/Peripheral Catheterization   You are scheduled for a Peripheral Angiogram on Thursday, June 6 with Dr. Nanetta Batty.  1. Please arrive at the [facility] (Main Entrance A) at [facility]: [address] [place], [place] [address] at 5:30 AM (This time is 2 hour(s) before your procedure to ensure your preparation). Free valet parking service is available. You will check in at ADMITTING. The support person will be asked to wait in the waiting room.  It is OK to have someone drop you off and come back when you are ready to be discharged.        Special note: Every effort is made to have your procedure done on time. Please understand that emergencies sometimes delay scheduled procedures.  2. Diet: Do not eat solid foods after midnight.  You may have clear liquids until 5 AM the day of the procedure.  3. Labs: You will need to have blood drawn today (5/29).  4. Medication instructions in preparation for your  procedure:    On the morning of your procedure, take Aspirin 81 mg and any morning medicines NOT listed above.  You may use sips of water.  5. Plan to go home the same day, you will only stay overnight if medically necessary. 6. You MUST have a responsible adult to drive you home. 7. An adult MUST be with you the first 24 hours after you arrive home. 8. Bring a current list of your medications, and the last time and date medication taken. 9. Bring ID and  current insurance cards. 10.Please wear clothes that are easy to get on and off and wear slip-on shoes.  Thank you for allowing [place] to care for you!   -- [facility]

## 2022-11-13 NOTE — H&P (View-Only) (Signed)
   11/13/2022 [name]   10/30/1950  [id number]  Primary Physician [name], Jamie H, MD Primary Cardiologist: Jamie Kehres J Cyrah Mclamb MD FACP, FACC, FAHA, FSCAI  HPI:  [name] is a 71 y.o. thin-appearing single Caucasian female mother of 2, grandmother of 2 grandchildren who works at [employer].  She was referred to me by [name] , NP for evaluation of PAD.  I apparently took care of her many years ago when I was part of [facility].  Her risk factors include ongoing tobacco abuse of 1/2 pack/day for the last 50+ years, treated hypertension and hyperlipidemia.  She had bypass grafting x 1 in 1996 by Dr. Trostel's with a LIMA to her LAD.  She had a STEMI 05/12/2017 and had PCI and drug-eluting stenting to the mid LAD just beyond LIMA insertion as well as a DES stent to the ramus intermedius branch.  Her EF is in the 40 to 50% range.  She also has left subclavian artery stenosis.  She is complain of lifestyle-limiting claudication at less than 1 block which is fairly symmetric.  Lower extremity arterial Doppler studies performed 11/04/2022 revealed ABIs in the 0.6 range with occluded SFAs bilaterally.  Current Meds  Medication Sig   albuterol (VENTOLIN HFA) 108 (90 Base) MCG/ACT inhaler Inhale 1-2 puffs into the lungs every 6 (six) hours as needed for wheezing or shortness of breath.   ALPRAZolam (XANAX) 0.25 MG tablet TAKE 1 TABLET BY MOUTH EVERY DAY (Patient taking differently: Take 0.25 mg by mouth daily as needed for anxiety.)   amLODipine (NORVASC) 10 MG tablet Take 1 tablet (10 mg total) by mouth daily.   aspirin 81 MG tablet Take 81 mg by mouth daily.   calcipotriene (DOVONOX) 0.005 % cream Apply 1 Application topically daily as needed (top of foot).   carvedilol (COREG) 3.125 MG tablet Take 1 tablet (3.125 mg total) by mouth 2 (two) times daily with a meal.   desonide (DESOWEN) 0.05 % cream Apply 1 Application topically daily as needed (head).   ezetimibe (ZETIA) 10  MG tablet Take 1 tablet (10 mg total) by mouth daily. For cholesterol   Fluticasone-Umeclidin-Vilant (TRELEGY ELLIPTA) 100-62.5-25 MCG/ACT AEPB Inhale 1 puff into the lungs daily.   Fluticasone-Umeclidin-Vilant (TRELEGY ELLIPTA) 100-62.5-25 MCG/ACT AEPB Inhale 1 puff into the lungs daily.   montelukast (SINGULAIR) 10 MG tablet TAKE 1 TABLET BY MOUTH EVERYDAY AT BEDTIME   Multiple Vitamins-Minerals (PRESERVISION AREDS 2) CAPS Take 1 capsule by mouth daily.   nitroGLYCERIN (NITROSTAT) 0.4 MG SL tablet PLACE 1 TABLET ON THE TONGUE &LET DISSOLVE EVERY 5 MINS AS NEEDED FOR CHEST PAIN   olmesartan (BENICAR) 20 MG tablet Take 1 tablet (20 mg total) by mouth daily.   omeprazole (PRILOSEC) 10 MG capsule TAKE 1 CAPSULE (10 MG TOTAL) BY MOUTH DAILY AS NEEDED.   oxyCODONE (OXY IR/ROXICODONE) 5 MG immediate release tablet Take 1 tablet (5 mg total) by mouth every 6 (six) hours as needed for severe pain.   rosuvastatin (CRESTOR) 40 MG tablet Take 1 tablet (40 mg total) by mouth daily. NEED APPOINTMENT   vitamin B-12 (CYANOCOBALAMIN) 100 MCG tablet Take 100 mcg by mouth daily.   Vitamin E 400 units TABS Take 400 Units by mouth daily.     Allergies  Allergen Reactions   Bisphosphonates Other (See Comments)    Osteonecrosis Jaw     Social History   Socioeconomic History   Marital status: Married    Spouse name: Not on file     Number of children: 2   Years of education: Not on file   Highest education level: Not on file  Occupational History   Not on file  Tobacco Use   Smoking status: Every Day    Packs/day: 0.50    Years: 40.00    Additional pack years: 0.00    Total pack years: 20.00    Types: Cigarettes   Smokeless tobacco: Never  Vaping Use   Vaping Use: Never used  Substance and Sexual Activity   Alcohol use: Yes    Comment: Occasional Beer   Drug use: No   Sexual activity: Yes    Birth control/protection: Post-menopausal  Other Topics Concern   Not on file  Social History  Narrative   Lives alone.     Social Determinants of Health   Financial Resource Strain: Not on file  Food Insecurity: Not on file  Transportation Needs: Not on file  Physical Activity: Not on file  Stress: Not on file  Social Connections: Not on file  Intimate Partner Violence: Not on file     Review of Systems: General: negative for chills, fever, night sweats or weight changes.  Cardiovascular: negative for chest pain, dyspnea on exertion, edema, orthopnea, palpitations, paroxysmal nocturnal dyspnea or shortness of breath Dermatological: negative for rash Respiratory: negative for cough or wheezing Urologic: negative for hematuria Abdominal: negative for nausea, vomiting, diarrhea, bright red blood per rectum, melena, or hematemesis Neurologic: negative for visual changes, syncope, or dizziness All other systems reviewed and are otherwise negative except as noted above.    Blood pressure 132/76, pulse 74, height 5' 7" (1.702 m), weight 99 lb 9.6 oz (45.2 kg), SpO2 99 %.  General appearance: alert and no distress Neck: no adenopathy, no JVD, supple, symmetrical, trachea midline, thyroid not enlarged, symmetric, no tenderness/mass/nodules, and left carotid bruit Lungs: clear to auscultation bilaterally Heart: regular rate and rhythm, S1, S2 normal, no murmur, click, rub or gallop Extremities: extremities normal, atraumatic, no cyanosis or edema Pulses: Absent pedal pulses Skin: Skin color, texture, turgor normal. No rashes or lesions Neurologic: Grossly normal  EKG not performed today  ASSESSMENT AND PLAN:   PVD (peripheral vascular disease) (HCC) [name] was referred to me for evaluation of PAD.  She did have moderate SFA disease by duplex May 2018.  Her most recent Doppler study performed 11/04/2022 revealed a right ABI of 0.66, left ABI 0.69 with occluded SFAs bilaterally.  She does have lifestyle-limiting claudication.  She wishes to proceed with outpatient peripheral  angiography and potential endovascular therapy.     Aneya Daddona J. Nickoles Gregori MD FACP,FACC,FAHA, FSCAI 11/13/2022 4:21 PM 

## 2022-11-14 ENCOUNTER — Telehealth: Payer: Self-pay | Admitting: Cardiovascular Disease

## 2022-11-14 ENCOUNTER — Telehealth: Payer: Self-pay | Admitting: Student

## 2022-11-14 LAB — BASIC METABOLIC PANEL
BUN/Creatinine Ratio: 22 (ref 12–28)
BUN: 22 mg/dL (ref 8–27)
CO2: 21 mmol/L (ref 20–29)
Calcium: 10.9 mg/dL — ABNORMAL HIGH (ref 8.7–10.3)
Chloride: 105 mmol/L (ref 96–106)
Creatinine, Ser: 0.99 mg/dL (ref 0.57–1.00)
Glucose: 85 mg/dL (ref 70–99)
Potassium: 5.9 mmol/L (ref 3.5–5.2)
Sodium: 140 mmol/L (ref 134–144)
eGFR: 61 mL/min/{1.73_m2} (ref >59)

## 2022-11-14 LAB — CBC
Hematocrit: 45 % (ref 34.0–46.6)
Hemoglobin: 15.7 g/dL (ref 11.1–15.9)
MCH: 31.3 pg (ref 26.6–33.0)
MCHC: 34.9 g/dL (ref 31.5–35.7)
MCV: 90 fL (ref 79–97)
Platelets: 285 10*3/uL (ref 150–450)
RBC: 5.02 x10E6/uL (ref 3.77–5.28)
RDW: 13.4 % (ref 11.7–15.4)
WBC: 7.8 10*3/uL (ref 3.4–10.8)

## 2022-11-14 NOTE — Telephone Encounter (Signed)
Patient is requesting call back to see if upcoming procedure can be changed to Monday 06/03. Requesting call back to discuss.

## 2022-11-14 NOTE — Telephone Encounter (Signed)
Received a critical lab alert of potassium of 5.9 overnight from lab draw of 1637 on 5/29.  Patient is doing well this morning with no complaints.  She denies any chest pain, palpitations, lightheadedness, or passing out.  Discussed that she may be instructed to repeat lab work within the 48 hours and instructed her to stay away from high potassium food including bananas, leafy greens etc.  Education provided on ED precautions for which patient demonstrated understanding.

## 2022-11-15 NOTE — Telephone Encounter (Signed)
Patient called to see if appointment can be changed. She states being that we have not heard anything as of yet she will keep her appointment for 6/6

## 2022-11-15 NOTE — Telephone Encounter (Signed)
Patient calling to get update on this request. Requesting call back. She states if it is too much of an inconvenience to move, she does not mind keeping it the way it is currently.

## 2022-11-17 ENCOUNTER — Telehealth: Payer: Self-pay | Admitting: Student

## 2022-11-17 NOTE — Telephone Encounter (Signed)
   The patient called the after-hours line reporting that she has experienced more dyspnea on exertion with very strenuous activities, such as climbing ladders. No dyspnea with routine activities. Reports her legs hurt with this as well. She does have known PAD and is scheduled for lower extremity angiography on 11/21/2022. She denies any recent chest pain, palpitations, orthopnea, PND or pitting edema. No discoloration along her lower extremities. She just wanted Dr. Allyson Sabal to be aware that she is still having pain along her legs. We reviewed indications which would prompt Emergency Department evaluation in the interim. Will forward today's note to Dr. Allyson Sabal as an Lorain Childes.  Signed, Ellsworth Lennox, PA-C 11/17/2022, 12:08 PM

## 2022-11-18 NOTE — Addendum Note (Signed)
Addended by: Bernita Buffy on: 11/18/2022 07:40 AM   Modules accepted: Orders

## 2022-11-20 ENCOUNTER — Telehealth: Payer: Self-pay | Admitting: *Deleted

## 2022-11-20 NOTE — Telephone Encounter (Addendum)
Abdominal aortogram scheduled at [facility] for: Thursday November 21, 2022 7:30 AM Arrival time [facility] Main Entrance A at: 5:30 AM  Nothing to eat after midnight prior to procedure, clear liquids until 5 AM day of procedure  Medication instructions: -Usual morning medications can be taken with sips of water including aspirin 81 mg.  Confirmed patient has responsible adult to drive home post procedure and be with patient first 24 hours after arriving home.  Plan to go home the same day, you will only stay overnight if medically necessary.  Reviewed procedure instructions with patient.

## 2022-11-21 ENCOUNTER — Other Ambulatory Visit: Payer: Self-pay

## 2022-11-21 ENCOUNTER — Encounter (HOSPITAL_COMMUNITY)
Admission: RE | Disposition: A | Discharge status: Home / Self Care | Payer: Self-pay | Source: Home / Self Care | Attending: Cardiovascular Disease

## 2022-11-21 ENCOUNTER — Ambulatory Visit (HOSPITAL_COMMUNITY)
Admission: RE | Admit: 2022-11-21 | Discharge: 2022-11-21 | Disposition: A | Discharge status: Home / Self Care | Payer: 59 | Attending: Cardiovascular Disease | Admitting: Cardiovascular Disease

## 2022-11-21 DIAGNOSIS — I252 Old myocardial infarction: Secondary | ICD-10-CM | POA: Diagnosis not present

## 2022-11-21 DIAGNOSIS — Z955 Presence of coronary angioplasty implant and graft: Secondary | ICD-10-CM | POA: Diagnosis not present

## 2022-11-21 DIAGNOSIS — F17210 Nicotine dependence, cigarettes, uncomplicated: Secondary | ICD-10-CM | POA: Insufficient documentation

## 2022-11-21 DIAGNOSIS — I10 Essential (primary) hypertension: Secondary | ICD-10-CM | POA: Diagnosis not present

## 2022-11-21 DIAGNOSIS — I771 Stricture of artery: Secondary | ICD-10-CM | POA: Diagnosis not present

## 2022-11-21 DIAGNOSIS — E785 Hyperlipidemia, unspecified: Secondary | ICD-10-CM | POA: Insufficient documentation

## 2022-11-21 DIAGNOSIS — I739 Peripheral vascular disease, unspecified: Secondary | ICD-10-CM | POA: Diagnosis present

## 2022-11-21 HISTORY — PX: ABDOMINAL AORTOGRAM W/LOWER EXTREMITY: CATH118223

## 2022-11-21 LAB — BASIC METABOLIC PANEL
Anion gap: 10 (ref 5–15)
BUN: 29 mg/dL — ABNORMAL HIGH (ref 8–23)
CO2: 23 mmol/L (ref 22–32)
Calcium: 9.8 mg/dL (ref 8.9–10.3)
Chloride: 107 mmol/L (ref 98–111)
Creatinine, Ser: 1.29 mg/dL — ABNORMAL HIGH (ref 0.44–1.00)
GFR, Estimated: 45 mL/min — ABNORMAL LOW (ref >60)
Glucose, Bld: 100 mg/dL — ABNORMAL HIGH (ref 70–99)
Potassium: 4.6 mmol/L (ref 3.5–5.1)
Sodium: 140 mmol/L (ref 135–145)

## 2022-11-21 SURGERY — ABDOMINAL AORTOGRAM W/LOWER EXTREMITY
Anesthesia: LOCAL

## 2022-11-21 MED ORDER — SODIUM CHLORIDE 0.9 % IV SOLN: 250.0000 mL | INTRAVENOUS | Status: DC | PRN

## 2022-11-21 MED ORDER — ONDANSETRON HCL 4 MG/2ML IJ SOLN: 4.0000 mg | Freq: Four times a day (QID) | INTRAMUSCULAR | Status: DC | PRN

## 2022-11-21 MED ORDER — ACETAMINOPHEN 325 MG PO TABS: 650.0000 mg | ORAL_TABLET | ORAL | Status: DC | PRN

## 2022-11-21 MED ORDER — SODIUM CHLORIDE 0.9 % WEIGHT BASED INFUSION: 1.0000 mL/kg/h | INTRAVENOUS | Status: DC

## 2022-11-21 MED ORDER — FENTANYL CITRATE (PF) 100 MCG/2ML IJ SOLN
INTRAMUSCULAR | Status: DC | PRN
  Administered 2022-11-21: 25 ug via INTRAVENOUS

## 2022-11-21 MED ORDER — MIDAZOLAM HCL 2 MG/2ML IJ SOLN
INTRAMUSCULAR | Status: DC | PRN
  Administered 2022-11-21: 1 mg via INTRAVENOUS

## 2022-11-21 MED ORDER — HYDRALAZINE HCL 20 MG/ML IJ SOLN: 5.0000 mg | INTRAMUSCULAR | Status: DC | PRN

## 2022-11-21 MED ORDER — ASPIRIN 81 MG PO CHEW: 81.0000 mg | CHEWABLE_TABLET | ORAL | Status: DC

## 2022-11-21 MED ORDER — ALBUTEROL SULFATE HFA 108 (90 BASE) MCG/ACT IN AERS: 1.0000 | INHALATION_SPRAY | Freq: Four times a day (QID) | RESPIRATORY_TRACT | Status: DC | PRN

## 2022-11-21 MED ORDER — LIDOCAINE HCL (PF) 1 % IJ SOLN
INTRAMUSCULAR | Status: AC
  Filled 2022-11-21: qty 30

## 2022-11-21 MED ORDER — MORPHINE SULFATE (PF) 2 MG/ML IV SOLN: 2.0000 mg | INTRAVENOUS | Status: DC | PRN

## 2022-11-21 MED ORDER — ASPIRIN 81 MG PO TBEC
81.0000 mg | DELAYED_RELEASE_TABLET | Freq: Every day | ORAL | Status: DC
Start: 2022-11-21 — End: 2022-11-21

## 2022-11-21 MED ORDER — CARVEDILOL 3.125 MG PO TABS: 3.1250 mg | ORAL_TABLET | Freq: Two times a day (BID) | ORAL | Status: DC

## 2022-11-21 MED ORDER — IODIXANOL 320 MG/ML IV SOLN
INTRAVENOUS | Status: DC | PRN
  Administered 2022-11-21: 73 mL via INTRA_ARTERIAL

## 2022-11-21 MED ORDER — SODIUM CHLORIDE 0.9% FLUSH: 3.0000 mL | Freq: Two times a day (BID) | INTRAVENOUS | Status: DC

## 2022-11-21 MED ORDER — HEPARIN (PORCINE) IN NACL 1000-0.9 UT/500ML-% IV SOLN
INTRAVENOUS | Status: DC | PRN
  Administered 2022-11-21 (×2): 500 mL

## 2022-11-21 MED ORDER — SODIUM CHLORIDE 0.9 % WEIGHT BASED INFUSION
3.0000 mL/kg/h | INTRAVENOUS | Status: DC
  Administered 2022-11-21: 3 mL/kg/h via INTRAVENOUS

## 2022-11-21 MED ORDER — LABETALOL HCL 5 MG/ML IV SOLN: 10.0000 mg | INTRAVENOUS | Status: DC | PRN

## 2022-11-21 MED ORDER — AMLODIPINE BESYLATE 5 MG PO TABS: 10.0000 mg | ORAL_TABLET | Freq: Every day | ORAL | Status: DC

## 2022-11-21 MED ORDER — SODIUM CHLORIDE 0.9% FLUSH: 3.0000 mL | INTRAVENOUS | Status: DC | PRN

## 2022-11-21 MED ORDER — EZETIMIBE 10 MG PO TABS: 10.0000 mg | ORAL_TABLET | Freq: Every day | ORAL | Status: DC

## 2022-11-21 MED ORDER — ROSUVASTATIN CALCIUM 20 MG PO TABS: 40.0000 mg | ORAL_TABLET | Freq: Every day | ORAL | Status: DC

## 2022-11-21 MED ORDER — MIDAZOLAM HCL 2 MG/2ML IJ SOLN
INTRAMUSCULAR | Status: AC
  Filled 2022-11-21: qty 2

## 2022-11-21 MED ORDER — ASPIRIN 81 MG PO TABS: 81.0000 mg | ORAL_TABLET | Freq: Every day | ORAL | Status: DC

## 2022-11-21 MED ORDER — NITROGLYCERIN 0.4 MG SL SUBL: 0.4000 mg | SUBLINGUAL_TABLET | SUBLINGUAL | Status: DC | PRN

## 2022-11-21 MED ORDER — LIDOCAINE HCL (PF) 1 % IJ SOLN
INTRAMUSCULAR | Status: DC | PRN
  Administered 2022-11-21: 10 mL

## 2022-11-21 MED ORDER — SODIUM CHLORIDE 0.9 % IV SOLN: INTRAVENOUS | Status: AC

## 2022-11-21 MED ORDER — FENTANYL CITRATE (PF) 100 MCG/2ML IJ SOLN
INTRAMUSCULAR | Status: AC
  Filled 2022-11-21: qty 2

## 2022-11-21 SURGICAL SUPPLY — 10 items
CATH ANGIO 5F PIGTAIL 65CM (CATHETERS) IMPLANT
CATH CROSS OVER TEMPO 5F (CATHETERS) IMPLANT
KIT PV (KITS) ×1 IMPLANT
SHEATH PINNACLE 5F 10CM (SHEATH) IMPLANT
SHEATH PROBE COVER 6X72 (BAG) IMPLANT
STOPCOCK MORSE 400PSI 3WAY (MISCELLANEOUS) IMPLANT
SYR MEDRAD MARK 7 150ML (SYRINGE) ×1 IMPLANT
TRANSDUCER W/STOPCOCK (MISCELLANEOUS) ×1 IMPLANT
TRAY PV CATH (CUSTOM PROCEDURE TRAY) ×1 IMPLANT
WIRE HITORQ VERSACORE ST 145CM (WIRE) IMPLANT

## 2022-11-21 NOTE — Progress Notes (Signed)
Pt ambulated to and from bathroom to void with no signs of oozing from groin site  

## 2022-11-21 NOTE — Progress Notes (Signed)
Site area- right  Site Prior to Removal- 0   Pressure Applied For-  25 MInutes   Bedrest Beginning at - 1032   Manual- Yes   Patient Status During Pull- Stable    Post Pull Groin Site- 0   Post Pull Instructions Given- Yes   Post Pull Pulses Present- Yes  left pedal pulse dopplered, right pedal pulse faint but palapble   Dressing Applied- Tegaderm and Gauze Dressing    Comments:  pt verbalizes understanding of site care instructions.

## 2022-11-21 NOTE — Interval H&P Note (Signed)
History and Physical Interval Note:  11/21/2022 7:39 AM  [name]  has presented today for surgery, with the diagnosis of PAD.  The various methods of treatment have been discussed with the patient and family. After consideration of risks, benefits and other options for treatment, the patient has consented to  Procedure(s): ABDOMINAL AORTOGRAM W/LOWER EXTREMITY (N/A) as a surgical intervention.  The patient's history has been reviewed, patient examined, no change in status, stable for surgery.  I have reviewed the patient's chart and labs.  Questions were answered to the patient's satisfaction.     Nanetta Batty

## 2022-11-22 ENCOUNTER — Encounter (HOSPITAL_COMMUNITY): Payer: Self-pay | Admitting: Cardiovascular Disease

## 2022-11-22 ENCOUNTER — Telehealth: Payer: Self-pay | Admitting: Cardiovascular Disease

## 2022-11-22 NOTE — Telephone Encounter (Signed)
Patient states when she saw Dr. Allyson Sabal yesterday he told her he would see her next week but she would like to clarify what he was referring to. She has several appointments coming up, but none next week. She also assumes MD may have been referring to her seeing a vascular surgeon. Pl;ease clarify.

## 2022-11-22 NOTE — Telephone Encounter (Signed)
Patient is asking about the appointments and what she needs.  She states "He couldn't do the surgery". And ask if she needs the test that are scheduled LEA/ABI?  Also does she need both the follow ups

## 2022-11-22 NOTE — Telephone Encounter (Signed)
Jamie Gess, MD  Cv Div Nl Triage26 minutes ago (4:36 PM)    She does not need follow-up ABIs and she does need an office visit to review her revascularization options.   Left message for pt letting her know that LEA/ABI dopplers have been cancelled. Pt will need to keep her follow up office visit with Dr. Allyson Sabal. Advised pt to call back with questions or concerns.

## 2022-11-27 ENCOUNTER — Telehealth: Payer: Self-pay | Admitting: Cardiovascular Disease

## 2022-11-27 NOTE — Telephone Encounter (Signed)
Spoke with pt, aware per dr berry, will make referral after seen 12/18/22.

## 2022-11-27 NOTE — Telephone Encounter (Signed)
Patient called stating she needs a referral to vascular and vein specialist on [address].  She states she called them for an appt and they advised her we would have to send them a referral before they can schedule her an appt.

## 2022-11-30 NOTE — Progress Notes (Deleted)
Cardiology Clinic Note   Date: 11/30/2022 ID: [name], DOB 17-Oct-1950, MRN [id number]  Primary Cardiologist:  Rollene Rotunda, MD  Patient Profile    [name] is a 72 y.o. female who presents to the clinic today for ***    Past medical history significant for: CAD. CABG x 1 1996: LIMA to LAD. LHC 05/12/2017 heart disease STEMI): Severe mid LAD stenosis.  Severe ramus intermedius stenosis.  Patent LIMA, LCx, and RCA.  PCI with DES to mid LAD just beyond the LIMA insertion site.  PCI with DES to ramus intermedius. Ischemic cardiomyopathy. Echo 10/08/2022: EF 60 to 65%.  Mild LVH.  Grade I DD.  Normal RV function.  Mildly elevated PA pressure.  Trivial MR.  Trivial AI.  Aortic valve sclerosis without stenosis. Carotid artery disease/subclavian artery stenosis. RCA aortogram/left subclavian artery angiogram 04/13/2007: Unsuccessful PTCA. Carotid ultrasound 02/13/2021: Bilateral ICA 1 to 39%.  Right vertebral artery demonstrates antegrade flow.  Left vertebral artery demonstrates retrograde flow.  Normal flow hemodynamics bilateral subclavian arteries. PAD. Arterial ultrasound lower extremity/ABI 11/04/2022: Total occlusion bilateral superficial femoral arteries.  Moderate bilateral lower extremity arterial disease. Abdominal aortogram/bilateral iliac angiogram 11/21/2022: Small infrarenal abdominal arctic aneurysm just above the iliac bifurcation left SFA CTO beginning at the origin and reconstituted adductor canal by profunda femoris collaterals.  There was three-vessel runoff.  50% right common and external iliac artery stenoses that did not appear hemodynamically significant.  The right SFA was occluded at its origin reconstituting in the adductor canal by profunda femoris collaterals with three-vessel runoff.  No percutaneous options for revascularization.  She will need consideration for bilateral femoropopliteal bypass grafting. Hypertension. Hyperlipidemia. Lipid panel 11/08/2021:  LDL 54, HDL 58, TG 75, total 127. COPD. Tobacco abuse. Single kidney.     History of Present Illness    [name] was first evaluated by Dr. Antoine Poche on 03/01/2015 for CAD. She is followed for the above outlined history.   Patient was seen in the office by me on 10/04/2022 for complaints of DOE and lightheadedness. She was s/p hernia surgery on 08/30/2022. Dyspnea was occurring with small amount of exertion and was unresponsive to her inhaler but did improve with rest. She also complained of bilateral claudication with pain, burning and weakness of bilateral calves extending up back of legs with walking and resolving with rest.  Echo showed normal LV/RV function, mild LVH, Grade I DD, mildly elevated PA pressure.  Arterial ultrasound/ABI showed moderate lower extremity arterial disease bilaterally.  Patient was evaluated by Dr. Allyson Sabal on 11/13/2022 for PAD.  She reported lifestyle limiting claudication.  She underwent peripheral angiography on 11/21/2022 (details above) and will return to the office to see Dr. Allyson Sabal on 12/18/2022 to discuss possible femoropopliteal bypass grafting.  Today, patient ***    DOE/ischemic cardiomyopathy.  Echo April 2024 showed EF 60 to 65%, mild LVH, Grade I DD, mildly elevated PA pressure. Patient*** Euvolemic and well compensated on exam.  Continue carvedilol, olmesartan. CAD.  S/p CABG x 1 1996.  PCI with DES to mid LAD and DES to ramus intermedius November 2018.  Patient denies chest pain, pressure or tightness.*** Continue aspirin, carvedilol, rosuvastatin, Zetia, as needed SL NTG. Hypertension.  BP today***. Patient *** Continue amlodipine, carvedilol, olmesartan. PAD/claudication.  Arterial ultrasound/ABI May 2024 showed moderate bilateral lower extremity arterial disease.  Abdominal aortogram/bilateral iliac angiogram June 2024 showed left SFA CTO and right SFA occlusion.  Recommendation for femoropopliteal bypass grafting.  Patient reports pain, burning and  weakness of  bilateral calves that sometimes radiates up back of legs with walking and resolves with rest.*** Continue aspirin, rosuvastatin, Zetia.  Follow-up with Dr. Allyson Sabal on 12/18/2022. Hyperlipidemia.  LDL May 2023 54, at goal.  Continue rosuvastatin and Zetia.*** Tobacco abuse. Patient continues to some 1/2 pack a day. This is decreased from 1 pack per day. She is congratulated on her effort and encouraged to continue decreasing smoke to complete cessation.***  ROS: All other systems reviewed and are otherwise negative except as noted in History of Present Illness.  Studies Reviewed    @hcmuseekg @ ECG personally reviewed by me today: ***  No significant changes from ***  Risk Assessment/Calculations    {Does this patient have ATRIAL FIBRILLATION?:[phone]} No BP recorded.  {Refresh Note OR Click here to enter BP  :1}***        Physical Exam    VS:  LMP  (LMP Unknown)  , BMI There is no height or weight on file to calculate BMI.  GEN: Well nourished, well developed, in no acute distress. Neck: No JVD or carotid bruits. Cardiac: *** RRR. No murmurs. No rubs or gallops.   Respiratory:  Respirations regular and unlabored. Clear to auscultation without rales, wheezing or rhonchi. GI: Soft, nontender, nondistended. Extremities: Radials/DP/PT 2+ and equal bilaterally. No clubbing or cyanosis. No edema ***  Skin: Warm and dry, no rash. Neuro: Strength intact.  Assessment & Plan   ***  Disposition: ***     {Are you ordering a CV Procedure (e.g. stress test, cath, DCCV, TEE, etc)?   Press F2        :[id number]}   Signed, Etta Grandchild. Minoru Chap, DNP, NP-C

## 2022-12-03 ENCOUNTER — Encounter (HOSPITAL_COMMUNITY): Payer: Medicare Other

## 2022-12-09 ENCOUNTER — Ambulatory Visit: Payer: Medicare Other | Attending: Student | Admitting: Student

## 2022-12-13 ENCOUNTER — Telehealth: Payer: Self-pay | Admitting: Pulmonary Disease

## 2022-12-13 DIAGNOSIS — J449 Chronic obstructive pulmonary disease, unspecified: Secondary | ICD-10-CM

## 2022-12-13 NOTE — Telephone Encounter (Signed)
Pt calling in to get a refill on Trelegy Ellipta   Pharmacy: [facility] [address]

## 2022-12-18 ENCOUNTER — Ambulatory Visit: Payer: Medicare Other | Attending: Cardiovascular Disease | Admitting: Cardiovascular Disease

## 2022-12-18 ENCOUNTER — Encounter: Payer: Self-pay | Admitting: Cardiovascular Disease

## 2022-12-18 VITALS — BP 118/64 | HR 96 | Ht 66.0 in | Wt 99.4 lb

## 2022-12-18 DIAGNOSIS — I739 Peripheral vascular disease, unspecified: Secondary | ICD-10-CM | POA: Diagnosis not present

## 2022-12-18 MED ORDER — TRELEGY ELLIPTA 100-62.5-25 MCG/ACT IN AEPB
1.0000 | INHALATION_SPRAY | Freq: Every day | RESPIRATORY_TRACT | 5 refills | Status: DC
Start: 2022-12-18 — End: 2023-05-28

## 2022-12-18 MED ORDER — CILOSTAZOL 50 MG PO TABS: 50.0000 mg | ORAL_TABLET | Freq: Two times a day (BID) | ORAL | 1 refills | Status: DC

## 2022-12-18 NOTE — Assessment & Plan Note (Signed)
History of peripheral arterial disease with claudication and ABIs in the 0.6 range bilaterally performed 11/04/2022.  I performed angiography on her 11/21/2022 revealing occluded SFAs bilaterally beginning at the origin reconstituting in the adductor canal by profunda femoris collaterals.  She had three-vessel runoff bilaterally.  I do not think she had percutaneous options for revascularization given the flush occlusion.  We talked about starting Pletal 50 mg p.o. twice daily for the next 3 months and if she has symptomatic improvement we will treat her conservatively otherwise we will refer her to vascular surgery for consideration of femoropopliteal bypass grafting.

## 2022-12-18 NOTE — Telephone Encounter (Signed)
Patient returned call. She confirmed the pharmacy. I advised her I would send in the refill for her.   Nothing further needed at time of call.

## 2022-12-18 NOTE — Patient Instructions (Signed)
Medication Instructions:  Your physician has recommended you make the following change in your medication:   -Start cilostazol (pletal) 50mg twice daily.  *If you need a refill on your cardiac medications before your next appointment, please call your pharmacy*   Follow-Up: At [facility], you and your health needs are our priority.  As part of our continuing mission to provide you with exceptional heart care, we have created designated Provider Care Teams.  These Care Teams include your primary Cardiologist (physician) and Advanced Practice Providers (APPs -  Physician Assistants and Nurse Practitioners) who all work together to provide you with the care you need, when you need it.  We recommend signing up for the patient portal called "MyChart".  Sign up information is provided on this After Visit Summary.  MyChart is used to connect with patients for Virtual Visits (Telemedicine).  Patients are able to view lab/test results, encounter notes, upcoming appointments, etc.  Non-urgent messages can be sent to your provider as well.   To learn more about what you can do with MyChart, go to [email or web address].    Your next appointment:   3 month(s)  Provider:   Jonathan Berry, MD  

## 2022-12-18 NOTE — Telephone Encounter (Signed)
Called patient but she did not answer. Left message for her to call back.  

## 2022-12-18 NOTE — Progress Notes (Signed)
12/18/2022 [name]   1950/11/09  [id number]  Primary Physician Nadene Rubins Jamie Cowden, MD Primary Cardiologist: Runell Gess MD Nicholes Calamity, [place]  HPI:  [name] is a 72 y.o.   thin-appearing single Caucasian female mother of 2, grandmother of 2 grandchildren who works at [employer].  She was referred to me by [name] , NP for evaluation of PAD.  I apparently took care of her many years ago when I was part of [facility].  I last saw her in the office 11/13/2022.  Her risk factors include ongoing tobacco abuse of 1/2 pack/day for the last 50+ years, treated hypertension and hyperlipidemia.  She had bypass grafting x 1 in 1996 by Dr. Remonia Richter with a LIMA to her LAD.  She had a STEMI 05/12/2017 and had PCI and drug-eluting stenting to the mid LAD just beyond LIMA insertion as well as a DES stent to the ramus intermedius branch.  Her EF is in the 40 to 50% range.  She also has left subclavian artery stenosis.  She is complain of lifestyle-limiting claudication at less than 1 block which is fairly symmetric.  Lower extremity arterial Doppler studies performed 11/04/2022 revealed ABIs in the 0.6 range with occluded SFAs bilaterally.  I performed peripheral angiography on her 11/23/2022 revealing occluded SFAs bilaterally beginning at the origin reconstituting in the adductor canal by profunda femoris collaterals.  She had three-vessel runoff bilaterally.  I do not think she had percutaneous options for revascularization given the flush occlusion and the length of the occlusion as well.  She does continue to smoke.  We talked about starting pharmacologic therapy with Pletal prior to entertaining surgical revascularization.  She otherwise denies chest pain.  She does get some shortness of breath on exertion probably related to COPD.   Current Meds  Medication Sig   albuterol (VENTOLIN HFA) 108 (90 Base) MCG/ACT inhaler Inhale 1-2 puffs into the lungs every 6 (six) hours as  needed for wheezing or shortness of breath.   ALPRAZolam (XANAX) 0.25 MG tablet TAKE 1 TABLET BY MOUTH EVERY DAY (Patient taking differently: Take 0.25 mg by mouth daily as needed for anxiety.)   amLODipine (NORVASC) 10 MG tablet Take 1 tablet (10 mg total) by mouth daily.   aspirin 81 MG tablet Take 81 mg by mouth daily.   calcipotriene (DOVONOX) 0.005 % cream Apply 1 Application topically daily as needed (top of foot).   carvedilol (COREG) 3.125 MG tablet Take 1 tablet (3.125 mg total) by mouth 2 (two) times daily with a meal.   cilostazol (PLETAL) 50 MG tablet Take 1 tablet (50 mg total) by mouth 2 (two) times daily.   desonide (DESOWEN) 0.05 % cream Apply 1 Application topically daily as needed (head).   ezetimibe (ZETIA) 10 MG tablet Take 1 tablet (10 mg total) by mouth daily. For cholesterol   Fluticasone-Umeclidin-Vilant (TRELEGY ELLIPTA) 100-62.5-25 MCG/ACT AEPB Inhale 1 puff into the lungs daily.   montelukast (SINGULAIR) 10 MG tablet TAKE 1 TABLET BY MOUTH EVERYDAY AT BEDTIME   Multiple Vitamins-Minerals (PRESERVISION AREDS 2) CAPS Take 1 capsule by mouth daily.   nitroGLYCERIN (NITROSTAT) 0.4 MG SL tablet PLACE 1 TABLET ON THE TONGUE &LET DISSOLVE EVERY 5 MINS AS NEEDED FOR CHEST PAIN   olmesartan (BENICAR) 20 MG tablet Take 1 tablet (20 mg total) by mouth daily.   omeprazole (PRILOSEC) 10 MG capsule TAKE 1 CAPSULE (10 MG TOTAL) BY MOUTH DAILY AS NEEDED.   oxyCODONE (OXY IR/ROXICODONE) 5  MG immediate release tablet Take 1 tablet (5 mg total) by mouth every 6 (six) hours as needed for severe pain.   rosuvastatin (CRESTOR) 40 MG tablet Take 1 tablet (40 mg total) by mouth daily. NEED APPOINTMENT   vitamin B-12 (CYANOCOBALAMIN) 100 MCG tablet Take 100 mcg by mouth daily.   Vitamin E 400 units TABS Take 400 Units by mouth daily.     Allergies  Allergen Reactions   Bisphosphonates Other (See Comments)    Osteonecrosis Jaw     Social History   Socioeconomic History   Marital  status: Married    Spouse name: Not on file   Number of children: 2   Years of education: Not on file   Highest education level: Not on file  Occupational History   Not on file  Tobacco Use   Smoking status: Every Day    Packs/day: 0.50    Years: 40.00    Additional pack years: 0.00    Total pack years: 20.00    Types: Cigarettes   Smokeless tobacco: Never  Vaping Use   Vaping Use: Never used  Substance and Sexual Activity   Alcohol use: Yes    Comment: Occasional Beer   Drug use: No   Sexual activity: Yes    Birth control/protection: Post-menopausal  Other Topics Concern   Not on file  Social History Narrative   Lives alone.     Social Determinants of Health   Financial Resource Strain: Not on file  Food Insecurity: Not on file  Transportation Needs: Not on file  Physical Activity: Not on file  Stress: Not on file  Social Connections: Not on file  Intimate Partner Violence: Not on file     Review of Systems: General: negative for chills, fever, night sweats or weight changes.  Cardiovascular: negative for chest pain, dyspnea on exertion, edema, orthopnea, palpitations, paroxysmal nocturnal dyspnea or shortness of breath Dermatological: negative for rash Respiratory: negative for cough or wheezing Urologic: negative for hematuria Abdominal: negative for nausea, vomiting, diarrhea, bright red blood per rectum, melena, or hematemesis Neurologic: negative for visual changes, syncope, or dizziness All other systems reviewed and are otherwise negative except as noted above.    Blood pressure 118/64, pulse 96, height 5\' 6"  (1.676 m), weight 99 lb 6.4 oz (45.1 kg), SpO2 94 %.  General appearance: alert and no distress Neck: no adenopathy, no carotid bruit, no JVD, supple, symmetrical, trachea midline, and thyroid not enlarged, symmetric, no tenderness/mass/nodules Lungs: clear to auscultation bilaterally Heart: regular rate and rhythm, S1, S2 normal, no murmur, click,  rub or gallop Extremities: extremities normal, atraumatic, no cyanosis or edema Pulses: Absent pedal pulses Skin: Skin color, texture, turgor normal. No rashes or lesions Neurologic: Grossly normal  EKG not performed today      ASSESSMENT AND PLAN:   PVD (peripheral vascular disease) (HCC) History of peripheral arterial disease with claudication and ABIs in the 0.6 range bilaterally performed 11/04/2022.  I performed angiography on her 11/21/2022 revealing occluded SFAs bilaterally beginning at the origin reconstituting in the adductor canal by profunda femoris collaterals.  She had three-vessel runoff bilaterally.  I do not think she had percutaneous options for revascularization given the flush occlusion.  We talked about starting Pletal 50 mg p.o. twice daily for the next 3 months and if she has symptomatic improvement we will treat her conservatively otherwise we will refer her to vascular surgery for consideration of femoropopliteal bypass grafting.     Runell Gess MD  Nicholes Calamity, FSCAI 12/18/2022 2:34 PM

## 2023-04-21 ENCOUNTER — Ambulatory Visit: Payer: Medicare Other | Attending: Cardiovascular Disease | Admitting: Cardiovascular Disease

## 2023-04-21 ENCOUNTER — Encounter: Payer: Self-pay | Admitting: Cardiovascular Disease

## 2023-04-21 VITALS — BP 142/68 | HR 88 | Ht 67.0 in | Wt 102.0 lb

## 2023-04-21 DIAGNOSIS — Z9861 Coronary angioplasty status: Secondary | ICD-10-CM

## 2023-04-21 DIAGNOSIS — I10 Essential (primary) hypertension: Secondary | ICD-10-CM | POA: Diagnosis not present

## 2023-04-21 DIAGNOSIS — J439 Emphysema, unspecified: Secondary | ICD-10-CM | POA: Diagnosis not present

## 2023-04-21 DIAGNOSIS — I2510 Atherosclerotic heart disease of native coronary artery without angina pectoris: Secondary | ICD-10-CM | POA: Diagnosis not present

## 2023-04-21 DIAGNOSIS — I6523 Occlusion and stenosis of bilateral carotid arteries: Secondary | ICD-10-CM | POA: Diagnosis not present

## 2023-04-21 DIAGNOSIS — I739 Peripheral vascular disease, unspecified: Secondary | ICD-10-CM | POA: Insufficient documentation

## 2023-04-21 NOTE — Patient Instructions (Signed)
    Follow-Up: At [facility], you and your health needs are our priority.  As part of our continuing mission to provide you with exceptional heart care, we have created designated Provider Care Teams.  These Care Teams include your primary Cardiologist (physician) and Advanced Practice Providers (APPs -  Physician Assistants and Nurse Practitioners) who all work together to provide you with the care you need, when you need it.  We recommend signing up for the patient portal called "MyChart".  Sign up information is provided on this After Visit Summary.  MyChart is used to connect with patients for Virtual Visits (Telemedicine).  Patients are able to view lab/test results, encounter notes, upcoming appointments, etc.  Non-urgent messages can be sent to your provider as well.   To learn more about what you can do with MyChart, go to [email or web address].    Your next appointment:   12 month(s)  Provider:   Jonathan Berry MD   

## 2023-04-21 NOTE — Assessment & Plan Note (Signed)
[name] was referred to me by Oretha Milch  NP for evaluation of claudication.  She had Doppler studies which revealed ABIs in the 0.6 range bilaterally with occluded SFAs.  I performed peripheral angiography on her 11/23/2022 revealing flush occluded SFAs bilaterally with reconstitution in the adductor canal and three-vessel runoff.  I did not think she had options for percutaneous revascularization.  I began her on Pletal which resulted in significant improvement in her claudication symptoms with point now where she has minimal claudication.

## 2023-04-21 NOTE — Progress Notes (Signed)
04/21/2023 [name]   1950/12/24  [id number]  Primary Physician Nadene Rubins Nyoka Cowden, MD Primary Cardiologist: Runell Gess MD Nicholes Calamity, [place]  HPI:  [name] is a 72 y.o.  hin-appearing single Caucasian female mother of 2, grandmother of 2 grandchildren who works at [employer].  She was referred to me by Carlos Levering , NP for evaluation of PAD.  Her primary cardiologist is Dr. Antoine Poche.  I apparently took care of her many years ago when I was part of [facility].  I last saw her in the office 12/18/2022.  Her risk factors include ongoing tobacco abuse of 1/2 pack/day for the last 50+ years, treated hypertension and hyperlipidemia.  She had bypass grafting x 1 in 1996 by Dr. Andrey Campanile with a LIMA to her LAD.  She had a STEMI 05/12/2017 and had PCI and drug-eluting stenting to the mid LAD just beyond LIMA insertion as well as a DES stent to the ramus intermedius branch.  Her EF is in the 40 to 50% range.  She also has left subclavian artery stenosis.  She apparently had left carotid to subclavian bypass as well.  She is complain of lifestyle-limiting claudication at less than 1 block which is fairly symmetric.  Lower extremity arterial Doppler studies performed 11/04/2022 revealed ABIs in the 0.6 range with occluded SFAs bilaterally.   I performed peripheral angiography on her 11/23/2022 revealing occluded SFAs bilaterally beginning at the origin reconstituting in the adductor canal by profunda femoris collaterals.  She had three-vessel runoff bilaterally.  I do not think she had percutaneous options for revascularization given the flush occlusion and the length of the occlusion as well.  She does continue to smoke.  We talked about starting pharmacologic therapy with Pletal prior to entertaining surgical revascularization.    Since I saw her 4 months ago her claudication is significantly improved on Pletal.  She denies chest pain or shortness of breath.  She does continue to  smoke level.  At this point, since her claudication is improved and no longer lifestyle limiting I do not think she needs revascularization.  Current Meds  Medication Sig   albuterol (VENTOLIN HFA) 108 (90 Base) MCG/ACT inhaler Inhale 1-2 puffs into the lungs every 6 (six) hours as needed for wheezing or shortness of breath.   ALPRAZolam (XANAX) 0.25 MG tablet TAKE 1 TABLET BY MOUTH EVERY DAY (Patient taking differently: Take 0.25 mg by mouth daily as needed for anxiety.)   amLODipine (NORVASC) 10 MG tablet Take 1 tablet (10 mg total) by mouth daily.   aspirin 81 MG tablet Take 81 mg by mouth daily.   calcipotriene (DOVONOX) 0.005 % cream Apply 1 Application topically daily as needed (top of foot).   carvedilol (COREG) 3.125 MG tablet Take 1 tablet (3.125 mg total) by mouth 2 (two) times daily with a meal.   cilostazol (PLETAL) 50 MG tablet Take 1 tablet (50 mg total) by mouth 2 (two) times daily.   desonide (DESOWEN) 0.05 % cream Apply 1 Application topically daily as needed (head).   ezetimibe (ZETIA) 10 MG tablet Take 1 tablet (10 mg total) by mouth daily. For cholesterol   Fluticasone-Umeclidin-Vilant (TRELEGY ELLIPTA) 100-62.5-25 MCG/ACT AEPB Inhale 1 puff into the lungs daily.   montelukast (SINGULAIR) 10 MG tablet TAKE 1 TABLET BY MOUTH EVERYDAY AT BEDTIME   Multiple Vitamins-Minerals (PRESERVISION AREDS 2) CAPS Take 1 capsule by mouth daily.   nitroGLYCERIN (NITROSTAT) 0.4 MG SL tablet PLACE 1 TABLET ON THE  TONGUE &LET DISSOLVE EVERY 5 MINS AS NEEDED FOR CHEST PAIN   olmesartan (BENICAR) 20 MG tablet Take 1 tablet (20 mg total) by mouth daily.   omeprazole (PRILOSEC) 10 MG capsule TAKE 1 CAPSULE (10 MG TOTAL) BY MOUTH DAILY AS NEEDED.   oxyCODONE (OXY IR/ROXICODONE) 5 MG immediate release tablet Take 1 tablet (5 mg total) by mouth every 6 (six) hours as needed for severe pain.   rosuvastatin (CRESTOR) 40 MG tablet Take 1 tablet (40 mg total) by mouth daily. NEED APPOINTMENT   vitamin  B-12 (CYANOCOBALAMIN) 100 MCG tablet Take 100 mcg by mouth daily.   Vitamin E 400 units TABS Take 400 Units by mouth daily.     Allergies  Allergen Reactions   Bisphosphonates Other (See Comments)    Osteonecrosis Jaw     Social History   Socioeconomic History   Marital status: Married    Spouse name: Not on file   Number of children: 2   Years of education: Not on file   Highest education level: Not on file  Occupational History   Not on file  Tobacco Use   Smoking status: Every Day    Current packs/day: 0.50    Average packs/day: 0.5 packs/day for 40.0 years (20.0 ttl pk-yrs)    Types: Cigarettes   Smokeless tobacco: Never  Vaping Use   Vaping status: Never Used  Substance and Sexual Activity   Alcohol use: Yes    Comment: Occasional Beer   Drug use: No   Sexual activity: Yes    Birth control/protection: Post-menopausal  Other Topics Concern   Not on file  Social History Narrative   Lives alone.     Social Determinants of Health   Financial Resource Strain: Not on file  Food Insecurity: Not on file  Transportation Needs: Not on file  Physical Activity: Not on file  Stress: Not on file  Social Connections: Not on file  Intimate Partner Violence: Not on file     Review of Systems: General: negative for chills, fever, night sweats or weight changes.  Cardiovascular: negative for chest pain, dyspnea on exertion, edema, orthopnea, palpitations, paroxysmal nocturnal dyspnea or shortness of breath Dermatological: negative for rash Respiratory: negative for cough or wheezing Urologic: negative for hematuria Abdominal: negative for nausea, vomiting, diarrhea, bright red blood per rectum, melena, or hematemesis Neurologic: negative for visual changes, syncope, or dizziness All other systems reviewed and are otherwise negative except as noted above.    Blood pressure (!) 142/68, pulse 88, height 5\' 7"  (1.702 m), weight 102 lb (46.3 kg).  General appearance: alert  and no distress Neck: no adenopathy, no JVD, supple, symmetrical, trachea midline, thyroid not enlarged, symmetric, no tenderness/mass/nodules, and soft bilateral carotid bruits Lungs: clear to auscultation bilaterally Heart: regular rate and rhythm, S1, S2 normal, no murmur, click, rub or gallop Extremities: extremities normal, atraumatic, no cyanosis or edema Pulses: Absent pedal pulses Skin: Skin color, texture, turgor normal. No rashes or lesions Neurologic: Grossly normal  EKG EKG Interpretation Date/Time:  Monday April 21 2023 15:15:19 EST Ventricular Rate:  88 PR Interval:  178 QRS Duration:  84 QT Interval:  346 QTC Calculation: 418 R Axis:   63  Text Interpretation: Normal sinus rhythm Biatrial enlargement T wave abnormality, consider inferior ischemia When compared with ECG of 02-Feb-2021 12:39, T wave inversion now evident in Anterior leads Confirmed by Nanetta Batty [phone]) on 04/21/2023 3:37:54 PM    ASSESSMENT AND PLAN:   Peripheral arterial disease (HCC) Ms.  [name] was referred to me by Oretha Milch  NP for evaluation of claudication.  She had Doppler studies which revealed ABIs in the 0.6 range bilaterally with occluded SFAs.  I performed peripheral angiography on her 11/23/2022 revealing flush occluded SFAs bilaterally with reconstitution in the adductor canal and three-vessel runoff.  I did not think she had options for percutaneous revascularization.  I began her on Pletal which resulted in significant improvement in her claudication symptoms with point now where she has minimal claudication.     Runell Gess MD FACP,FACC,FAHA, [facility] 04/21/2023 3:47 PM

## 2023-05-19 ENCOUNTER — Other Ambulatory Visit: Payer: Self-pay | Admitting: Pulmonary Disease

## 2023-05-19 DIAGNOSIS — J449 Chronic obstructive pulmonary disease, unspecified: Secondary | ICD-10-CM

## 2023-05-20 LAB — EXTERNAL GENERIC LAB PROCEDURE: COLOGUARD: NEGATIVE

## 2023-05-20 LAB — COLOGUARD: COLOGUARD: NEGATIVE

## 2023-05-28 ENCOUNTER — Encounter: Payer: Self-pay | Admitting: Pulmonary Disease

## 2023-05-28 ENCOUNTER — Ambulatory Visit (INDEPENDENT_AMBULATORY_CARE_PROVIDER_SITE_OTHER): Payer: Medicare Other | Admitting: Pulmonary Disease

## 2023-05-28 VITALS — BP 100/59 | HR 87 | Temp 98.2°F | Ht 67.0 in | Wt 103.0 lb

## 2023-05-28 DIAGNOSIS — H04203 Unspecified epiphora, bilateral lacrimal glands: Secondary | ICD-10-CM | POA: Diagnosis not present

## 2023-05-28 DIAGNOSIS — J302 Other seasonal allergic rhinitis: Secondary | ICD-10-CM

## 2023-05-28 DIAGNOSIS — J449 Chronic obstructive pulmonary disease, unspecified: Secondary | ICD-10-CM

## 2023-05-28 DIAGNOSIS — F17210 Nicotine dependence, cigarettes, uncomplicated: Secondary | ICD-10-CM

## 2023-05-28 MED ORDER — FEXOFENADINE HCL 180 MG PO TABS: 180.0000 mg | ORAL_TABLET | Freq: Every day | ORAL | 5 refills | Status: DC

## 2023-05-28 MED ORDER — SPIRIVA RESPIMAT 2.5 MCG/ACT IN AERS
2.0000 | INHALATION_SPRAY | Freq: Every day | RESPIRATORY_TRACT | 5 refills | Status: DC
Start: 2023-05-28 — End: 2023-11-12

## 2023-05-28 NOTE — Patient Instructions (Addendum)
We will change you from Trelegy back to Spiriva given concern of watery eyes  Discuss the watery eye issues further with your eye doctor  Follow up in 6 months, call sooner if needed

## 2023-05-28 NOTE — Progress Notes (Signed)
Synopsis: Referred in February 2023 for COPD by Dorinda Hill, MD  Subjective:   PATIENT ID: [name], [name]  HPI  Chief Complaint  Patient presents with   Follow-up    Pt c/o watery eyes over the past few months- wants to try a new allergy med. She is concerned that the trelegy has caused eye infection. Breathing is stable.    [name] is a 72 year old [name], [name], Jamie and peripheral vascular [name] who returns to pulmonary clinic for COPD.   The patient, with a history of macular degeneration, presents with a complaint of watery eyes for the past five to six months. They describe a sensation of dryness, despite the excessive tearing. They also report episodes of eye discharge, causing their eyes to stick together upon waking. The patient denies any redness in the eyes. They have been using an inhaler (Trelegy) for an unspecified lung condition, and they wonder if this could be contributing to their eye symptoms. They also report using an over-the-counter allergy medication (Benadryl) without relief. The patient has a history of receiving injections for macular degeneration four times a year, but they did not have the watery eye symptoms at their last eye doctor visit. They also report occasional swelling in the eyes, particularly the right eye.  OV 05/02/22 She was started on spiriva and montelukast at last visit. She has noticed great benefit. She is using albuterol about 1 time per day.   Initial OV 07/31/21 She has been experiencing increased cough and shortness of breath over recent months. The cough is occasionally productive in the mornings and is worse at night. She is waking up 3-4 times per week due to cough. She denies wheezing. She has sinus congestion and drainage along with seasonal allergies. Strong perfumes tend to bother her breathing.   She works for [employer] and has been  there for 30 years. She is active at work Therapist, music. She is smoking quarter to half a pack per day. She has been smoking for 20+ years and was previously smoking just under 1 pack per day. She was exposed to second hand smoke in childhood.   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   Smoker      Family History  Problem Relation Age of Onset   Depression Sister    COPD Father    Cancer Sister        hysterectomy sec to cancer   Cancer Sister        cervical cancer   COPD Brother      Social History   Socioeconomic History   Marital status: Married    Spouse name: Not on file   Number of children: 2   Years of education: Not on file   Highest education level: Not on file  Occupational History   Not  on file  Tobacco Use   Smoking status: Every Day    Current packs/day: 0.50    Average packs/day: 0.5 packs/day for 40.0 years (20.0 ttl pk-yrs)    Types: Cigarettes   Smokeless tobacco: Never  Vaping Use   Vaping status: Never Used  Substance and Sexual Activity   Alcohol use: Yes    Comment: Occasional Beer   Drug use: No   Sexual activity: Yes    Birth control/protection: Post-menopausal  Other Topics Concern   Not on file  Social History Narrative   Lives alone.     Social Determinants of Health   Financial Resource Strain: Not on file  Food Insecurity: Not on file  Transportation Needs: Not on file  Physical Activity: Not on file   Stress: Not on file  Social Connections: Not on file  Intimate Partner Violence: Not on file     Allergies  Allergen Reactions   Bisphosphonates Other (See Comments)    Osteonecrosis Jaw      Outpatient Medications Prior to Visit  Medication Sig Dispense Refill   albuterol (VENTOLIN HFA) 108 (90 Base) MCG/ACT inhaler Inhale 1-2 puffs into the lungs every 6 (six) hours as needed for wheezing or shortness of breath. 18 g 2   ALPRAZolam (XANAX) 0.25 MG tablet TAKE 1 TABLET BY MOUTH EVERY DAY (Patient taking differently: Take 0.25 mg by mouth Jamie as needed for anxiety.) 30 tablet 0   amLODipine (NORVASC) 10 MG tablet Take 1 tablet (10 mg total) by mouth Jamie. 90 tablet 3   aspirin 81 MG tablet Take 81 mg by mouth Jamie.     calcipotriene (DOVONOX) 0.005 % cream Apply 1 Application topically Jamie as needed (top of foot).     carvedilol (COREG) 3.125 MG tablet Take 1 tablet (3.125 mg total) by mouth 2 (two) times Jamie with a meal. 180 tablet 3   cilostazol (PLETAL) 50 MG tablet Take 1 tablet (50 mg total) by mouth 2 (two) times Jamie. 180 tablet 1   desonide (DESOWEN) 0.05 % cream Apply 1 Application topically Jamie as needed (head).     diphenhydrAMINE (BENADRYL) 25 mg capsule Take 25 mg by mouth every 6 (six) hours as needed.     ezetimibe (ZETIA) 10 MG tablet Take 1 tablet (10 mg total) by mouth Jamie. For cholesterol 90 tablet 3   montelukast (SINGULAIR) 10 MG tablet TAKE 1 TABLET BY MOUTH EVERYDAY AT BEDTIME 90 tablet 3   Multiple Vitamins-Minerals (PRESERVISION AREDS 2) CAPS Take 1 capsule by mouth Jamie.     nitroGLYCERIN (NITROSTAT) 0.4 MG SL tablet PLACE 1 TABLET ON THE TONGUE &LET DISSOLVE EVERY 5 MINS AS NEEDED FOR CHEST PAIN 25 tablet 1   olmesartan (BENICAR) 20 MG tablet Take 1 tablet (20 mg total) by mouth Jamie. 90 tablet 3   omeprazole (PRILOSEC) 10 MG capsule TAKE 1 CAPSULE (10 MG TOTAL) BY MOUTH Jamie AS NEEDED. 90 capsule 1   oxyCODONE (OXY IR/ROXICODONE) 5 MG  immediate release tablet Take 1 tablet (5 mg total) by mouth every 6 (six) hours as needed for severe pain. 12 tablet 0   rosuvastatin (CRESTOR) 40 MG tablet Take 1 tablet (40 mg total) by mouth Jamie. NEED APPOINTMENT 90 tablet 3   vitamin B-12 (CYANOCOBALAMIN) 100 MCG tablet Take 100 mcg by mouth Jamie.     Vitamin E 400 units TABS Take 400 Units by mouth Jamie.     Fluticasone-Umeclidin-Vilant (TRELEGY ELLIPTA) 100-62.5-25 MCG/ACT AEPB Inhale 1 puff into the  lungs Jamie. 60 each 5   No facility-administered medications prior to visit.   Review of Systems  Constitutional:  Negative for chills, fever, malaise/fatigue and weight loss.  HENT:  Negative for congestion, sinus pain and sore throat.   Eyes:  Positive for discharge (watery).  Respiratory:  Negative for cough, hemoptysis, sputum production, shortness of breath and wheezing.   Cardiovascular:  Negative for chest pain, palpitations, orthopnea, claudication and leg swelling.  Gastrointestinal:  Negative for abdominal pain, heartburn, nausea and vomiting.  Genitourinary: Negative.   Musculoskeletal:  Negative for joint pain and myalgias.  Skin:  Negative for rash.  Neurological:  Negative for weakness.    Objective:   Vitals:   05/28/23 1457  BP: (!) 100/59  Pulse: 87  Temp: 98.2 F (36.8 C)  TempSrc: Oral  SpO2: 96%  Weight: 103 lb (46.7 kg)  Height: 5\' 7"  (1.702 m)    Physical Exam Constitutional:      General: She is not in acute distress.    Appearance: She is not ill-appearing.  HENT:     Head: Normocephalic and atraumatic.  Eyes:     Conjunctiva/sclera: Conjunctivae normal.  Cardiovascular:     Rate and Rhythm: Normal rate and regular rhythm.     Pulses: Normal pulses.     Heart sounds: Normal heart sounds. No murmur heard. Pulmonary:     Effort: Pulmonary effort is normal.     Breath sounds: Normal breath sounds. No wheezing, rhonchi or rales.  Musculoskeletal:     Right lower leg: No edema.     Left  lower leg: No edema.  Skin:    General: Skin is warm and dry.  Neurological:     General: No focal deficit present.     Mental Status: She is alert.    CBC    Component Value Date/Time   WBC 7.8 11/13/2022 1637   WBC 7.3 08/27/2022 0935   RBC 5.02 11/13/2022 1637   RBC 4.56 08/27/2022 0935   HGB 15.7 11/13/2022 1637   HCT 45.0 11/13/2022 1637   PLT 285 11/13/2022 1637   MCV 90 11/13/2022 1637   MCH 31.3 11/13/2022 1637   MCH 30.9 08/27/2022 0935   MCHC 34.9 11/13/2022 1637   MCHC 32.2 08/27/2022 0935   RDW 13.4 11/13/2022 1637   LYMPHSABS 1,809 04/10/2020 1447   MONOABS 0.6 07/23/2017 0737   EOSABS 84 04/10/2020 1447   BASOSABS 68 04/10/2020 1447      Latest Ref Rng & Units 11/21/2022    6:52 AM 11/13/2022    4:37 PM 08/27/2022    9:35 AM  BMP  Glucose 70 - 99 mg/dL 829  85  562   BUN 8 - 23 mg/dL 29  22  19    Creatinine 0.44 - 1.00 mg/dL 1.30  8.65  7.84   BUN/Creat Ratio 12 - 28  22    Sodium 135 - 145 mmol/L 140  140  140   Potassium 3.5 - 5.1 mmol/L 4.6  5.9  4.5   Chloride 98 - 111 mmol/L 107  105  106   CO2 22 - 32 mmol/L 23  21  27    Calcium 8.9 - 10.3 mg/dL 9.8  69.6  9.6    Chest imaging: CXR 02/02/21 The heart size and mediastinal contours are within normal limits. Aortic atherosclerotic calcification noted. Prior median sternotomy noted.   Pulmonary hyperinflation again seen, consistent with COPD. Both lungs are clear. Surgical clips again noted in the left  upper quadrant. The visualized skeletal structures are unremarkable.  PFT:    Latest Ref Rng & Units 05/02/2022   10:09 AM  PFT Results  FVC-Pre L 1.79   FVC-Predicted Pre % 55   FVC-Post L 2.09   FVC-Predicted Post % 64   Pre FEV1/FVC % % 54   Post FEV1/FCV % % 44   FEV1-Pre L 0.97   FEV1-Predicted Pre % 39   FEV1-Post L 0.92   DLCO uncorrected ml/min/mmHg 12.12   DLCO UNC% % 58   DLCO corrected ml/min/mmHg 12.12   DLCO COR %Predicted % 58   DLVA Predicted % 83   TLC L 5.68   TLC %  Predicted % 106   RV % Predicted % 161   PFT 2034: severe obstruction, air trapping, moderate diffusion defect  Labs:  Path:  Echo:  Heart Catheterization:  Assessment & Plan:   Chronic obstructive pulmonary [name], unspecified COPD type (HCC) - Plan: Tiotropium Bromide Monohydrate (SPIRIVA RESPIMAT) 2.5 MCG/ACT AERS  Cigarette smoker  Watery eyes  Seasonal allergies - Plan: fexofenadine (ALLEGRA) 180 MG tablet  Discussion: [name], [name], Jamie and peripheral vascular [name] who returns to pulmonary clinic for COPD.   COPD Cigarette Smoking - stop trelegy and start spiriva Jamie in case she is having watery eye side effects to inhaled medications - PRN albuterol - Follow up with lung cancer screening  Seasonal Allergies - stop benedryl - start fexofenadine 180mg  Jamie   Watery Eyes - follow up with eye doctor  Follow up in 6 months  Melody Comas, MD [facility] Office: [phone]   Current Outpatient Medications:    albuterol (VENTOLIN HFA) 108 (90 Base) MCG/ACT inhaler, Inhale 1-2 puffs into the lungs every 6 (six) hours as needed for wheezing or shortness of breath., Disp: 18 g, Rfl: 2   ALPRAZolam (XANAX) 0.25 MG tablet, TAKE 1 TABLET BY MOUTH EVERY DAY (Patient taking differently: Take 0.25 mg by mouth Jamie as needed for anxiety.), Disp: 30 tablet, Rfl: 0   amLODipine (NORVASC) 10 MG tablet, Take 1 tablet (10 mg total) by mouth Jamie., Disp: 90 tablet, Rfl: 3   aspirin 81 MG tablet, Take 81 mg by mouth Jamie., Disp: , Rfl:    calcipotriene (DOVONOX) 0.005 % cream, Apply 1 Application topically Jamie as needed (top of foot)., Disp: , Rfl:    carvedilol (COREG) 3.125 MG tablet, Take 1 tablet (3.125 mg total) by mouth 2 (two) times Jamie with a meal., Disp: 180 tablet, Rfl: 3   cilostazol (PLETAL) 50 MG tablet, Take 1 tablet (50 mg total) by mouth 2 (two) times  Jamie., Disp: 180 tablet, Rfl: 1   desonide (DESOWEN) 0.05 % cream, Apply 1 Application topically Jamie as needed (head)., Disp: , Rfl:    diphenhydrAMINE (BENADRYL) 25 mg capsule, Take 25 mg by mouth every 6 (six) hours as needed., Disp: , Rfl:    ezetimibe (ZETIA) 10 MG tablet, Take 1 tablet (10 mg total) by mouth Jamie. For cholesterol, Disp: 90 tablet, Rfl: 3   fexofenadine (ALLEGRA) 180 MG tablet, Take 1 tablet (180 mg total) by mouth Jamie., Disp: 30 tablet, Rfl: 5   montelukast (SINGULAIR) 10 MG tablet, TAKE 1 TABLET BY MOUTH EVERYDAY AT BEDTIME, Disp: 90 tablet, Rfl: 3   Multiple Vitamins-Minerals (PRESERVISION AREDS 2) CAPS, Take 1 capsule by mouth Jamie., Disp: , Rfl:    nitroGLYCERIN (NITROSTAT) 0.4 MG SL tablet,  PLACE 1 TABLET ON THE TONGUE &LET DISSOLVE EVERY 5 MINS AS NEEDED FOR CHEST PAIN, Disp: 25 tablet, Rfl: 1   olmesartan (BENICAR) 20 MG tablet, Take 1 tablet (20 mg total) by mouth Jamie., Disp: 90 tablet, Rfl: 3   omeprazole (PRILOSEC) 10 MG capsule, TAKE 1 CAPSULE (10 MG TOTAL) BY MOUTH Jamie AS NEEDED., Disp: 90 capsule, Rfl: 1   oxyCODONE (OXY IR/ROXICODONE) 5 MG immediate release tablet, Take 1 tablet (5 mg total) by mouth every 6 (six) hours as needed for severe pain., Disp: 12 tablet, Rfl: 0   rosuvastatin (CRESTOR) 40 MG tablet, Take 1 tablet (40 mg total) by mouth Jamie. NEED APPOINTMENT, Disp: 90 tablet, Rfl: 3   Tiotropium Bromide Monohydrate (SPIRIVA RESPIMAT) 2.5 MCG/ACT AERS, Inhale 2 puffs into the lungs Jamie., Disp: 1 g, Rfl: 5   vitamin B-12 (CYANOCOBALAMIN) 100 MCG tablet, Take 100 mcg by mouth Jamie., Disp: , Rfl:    Vitamin E 400 units TABS, Take 400 Units by mouth Jamie., Disp: , Rfl:

## 2023-06-02 ENCOUNTER — Other Ambulatory Visit: Payer: Self-pay | Admitting: Acute Care

## 2023-06-02 DIAGNOSIS — Z87891 Personal history of nicotine dependence: Secondary | ICD-10-CM

## 2023-06-02 DIAGNOSIS — Z122 Encounter for screening for malignant neoplasm of respiratory organs: Secondary | ICD-10-CM

## 2023-06-02 DIAGNOSIS — F17210 Nicotine dependence, cigarettes, uncomplicated: Secondary | ICD-10-CM

## 2023-06-03 ENCOUNTER — Telehealth: Payer: Self-pay | Admitting: Cardiovascular Disease

## 2023-06-03 DIAGNOSIS — I739 Peripheral vascular disease, unspecified: Secondary | ICD-10-CM

## 2023-06-03 NOTE — Telephone Encounter (Signed)
Patient calling to say that she wants Dr. Allyson Sabal to go ahead and setup the appt for the surgeon. Please advise

## 2023-06-10 NOTE — Telephone Encounter (Signed)
Left detailed message (ok per DPR) to let pt know referral was placed to [facility]. Call back number left for questions or concerns.

## 2023-06-10 NOTE — Telephone Encounter (Signed)
Runell Gess, MD  You; Marilynn Rail, RN    Carma Lair, can you refer her to [facility]   Referral placed. Will notify pt.

## 2023-07-01 ENCOUNTER — Other Ambulatory Visit: Payer: Self-pay | Admitting: Cardiovascular Disease

## 2023-07-09 ENCOUNTER — Ambulatory Visit
Admission: RE | Admit: 2023-07-09 | Discharge: 2023-07-09 | Disposition: A | Discharge status: Home / Self Care | Payer: Medicare Other | Source: Ambulatory Visit | Attending: Acute Care | Admitting: Acute Care

## 2023-07-09 DIAGNOSIS — Z87891 Personal history of nicotine dependence: Secondary | ICD-10-CM

## 2023-07-09 DIAGNOSIS — Z122 Encounter for screening for malignant neoplasm of respiratory organs: Secondary | ICD-10-CM

## 2023-07-09 DIAGNOSIS — F17210 Nicotine dependence, cigarettes, uncomplicated: Secondary | ICD-10-CM

## 2023-07-10 ENCOUNTER — Other Ambulatory Visit: Payer: Self-pay

## 2023-07-10 DIAGNOSIS — I739 Peripheral vascular disease, unspecified: Secondary | ICD-10-CM

## 2023-07-17 ENCOUNTER — Other Ambulatory Visit: Payer: Self-pay

## 2023-07-17 DIAGNOSIS — F17210 Nicotine dependence, cigarettes, uncomplicated: Secondary | ICD-10-CM

## 2023-07-17 DIAGNOSIS — Z87891 Personal history of nicotine dependence: Secondary | ICD-10-CM

## 2023-07-17 DIAGNOSIS — Z122 Encounter for screening for malignant neoplasm of respiratory organs: Secondary | ICD-10-CM

## 2023-07-23 ENCOUNTER — Other Ambulatory Visit: Payer: Self-pay

## 2023-07-23 DIAGNOSIS — I714 Abdominal aortic aneurysm, without rupture, unspecified: Secondary | ICD-10-CM

## 2023-07-23 NOTE — Progress Notes (Signed)
 Office Note     CC: Claudication, AAA, left carotid subclavian bypass, CABG-LIMA graft Requesting Provider:  Stephane Leita DEL, MD  HPI: [name] is a 73 y.o. (11/16/1950) female presenting at the request of Dr. Court with arterial disease in multiple vascular beds.  On exam, [name] was doing well.  A native of [place] , she moved to [place] to young age.  She continues to work full-time at [employer], and loves what she does.  She is a mother of 2, and a grandmother of 2.  She has previously seen Dr. Court for lower extremity claudication and underwent angiography demonstrating inflow disease, as well as bilateral SFA occlusions. Other surgical history includes left carotid subclavian bypass, CABG.  She has a known AAA appreciated on recent angiography.  At her last visit with Dr. Court, [name] noted significant improvement in bilateral lower extremity symptoms with the use of Pletal .  She continues to be symptom-free on today's visit.  [name] denies significant claudication, no ischemic rest pain, no tissue loss.  She continues to smoke on a daily basis.  No family history of AAA, no vertebrobasilar symptoms, angina, pain or cramping with left upper extremity use.   The pt is  on a statin for cholesterol management.  The pt is  on a daily aspirin .   Other AC:  pletal  The pt is  on medication for hypertension.   The pt is not diabetic.  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, jaw (HCC)    Peripheral vascular disease (HCC)     carotid to subclavian artery bypass graft   Smoker     Past Surgical History:  Procedure Laterality Date   ABDOMINAL AORTOGRAM W/LOWER EXTREMITY N/A 11/21/2022   Procedure: ABDOMINAL AORTOGRAM W/LOWER EXTREMITY;  Surgeon: Court Dorn PARAS, MD;  Location: [facility] INVASIVE CV LAB;  Service: Cardiovascular;  Laterality: N/A;   CAROTID ARTERY - SUBCLAVIAN ARTERY BYPASS GRAFT Left 04/18/2007   COLONOSCOPY  2008   CORONARY ARTERY BYPASS GRAFT     CORONARY/GRAFT ACUTE MI REVASCULARIZATION N/A 05/12/2017   Procedure: Coronary/Graft Acute MI Revascularization;  Surgeon: Wonda Sharper, MD;  Location: [facility] INVASIVE CV LAB;  Service: Cardiovascular;  Laterality: N/A;   DEBRIDEMENT MANDIBLE Left 03/23/2019   Procedure: DEBRIDEMENT LEFT MANDIBLE,SEQUESTRECTOMY;  Surgeon: Joanette Soulier, DMD;  Location: [facility] OR;  Service: Oral Surgery;  Laterality: Left;   EYE SURGERY Bilateral 2020   cataract removal   KIDNEY SURGERY Left    73 yo   LEFT HEART CATH AND CORONARY ANGIOGRAPHY N/A 05/12/2017   Procedure: LEFT HEART CATH AND CORONARY ANGIOGRAPHY;  Surgeon: Wonda Sharper, MD;  Location: [facility] INVASIVE CV LAB;  Service: Cardiovascular;  Laterality: N/A;   Left Nephrectomy Left 10/17/1983   Dr. Sural---StagHornCalculus, Left Kidney Nonfunctioning wiht Acute and Chronic Pyelonephritis   WISDOM TOOTH EXTRACTION     XI ROBOTIC ASSISTED INGUINAL HERNIA REPAIR WITH MESH Left 08/30/2022   Procedure: ROBOTIC LEFT INGUINAL HERNIA REPAIR WITH MESH;  Surgeon: Kinsinger, Herlene Righter, MD;  Location: [facility] ORS;  Service: General;  Laterality: Left;    Social History   Socioeconomic History   Marital status: Married    Spouse name: Not on file   Number of children: 2   Years of education: Not on file   Highest education level: Not on file  Occupational History   Not on file  Tobacco Use   Smoking status: Every Day    Current packs/day: 0.50    Average packs/day: 0.5 packs/day for 40.0 years (20.0 ttl pk-yrs)    Types:  Cigarettes   Smokeless tobacco: Never  Vaping Use   Vaping status: Never Used  Substance and Sexual Activity   Alcohol use: Yes    Comment: Occasional Beer   Drug use: No   Sexual activity: Yes    Birth control/protection: Post-menopausal  Other Topics Concern   Not on file  Social History Narrative   Lives alone.     Social Drivers of Corporate Investment Banker Strain: Not on file  Food Insecurity: Not on file  Transportation Needs: Not on file  Physical Activity: Not on file  Stress: Not on file  Social Connections: Not on file  Intimate Partner Violence: Not on file   Family History  Problem Relation Age of Onset   Depression Sister    COPD Father    Cancer Sister        hysterectomy sec to cancer   Cancer Sister        cervical cancer   COPD Brother     Current Outpatient Medications  Medication Sig Dispense Refill   albuterol  (VENTOLIN  HFA) 108 (90 Base) MCG/ACT inhaler Inhale 1-2 puffs into the lungs every 6 (six) hours as needed for wheezing or shortness of breath. 18 g 2   ALPRAZolam  (XANAX ) 0.25 MG tablet TAKE 1 TABLET BY MOUTH EVERY DAY (Patient taking differently: Take 0.25 mg by mouth daily as needed for anxiety.) 30 tablet 0   amLODipine  (NORVASC ) 10 MG tablet Take 1 tablet (10 mg total) by mouth daily. 90 tablet 3   aspirin  81 MG tablet Take 81 mg by mouth daily.     calcipotriene (DOVONOX) 0.005 % cream Apply 1 Application topically daily as needed (top of foot).     carvedilol  (COREG ) 3.125 MG tablet Take 1 tablet (3.125 mg total) by mouth 2 (two) times daily with a meal. 180 tablet 3   cilostazol  (PLETAL ) 50 MG tablet TAKE 1 TABLET(50 MG) BY MOUTH TWICE DAILY 180 tablet 3   desonide (DESOWEN) 0.05 % cream Apply 1 Application topically daily as needed (head).     diphenhydrAMINE (BENADRYL) 25 mg capsule Take 25 mg by mouth every 6 (six) hours as needed.     ezetimibe  (ZETIA ) 10 MG tablet Take 1 tablet (10 mg total) by mouth daily. For cholesterol 90  tablet 3   fexofenadine  (ALLEGRA ) 180 MG tablet Take 1 tablet (180 mg total) by mouth daily. 30 tablet 5   montelukast  (SINGULAIR ) 10 MG tablet TAKE 1 TABLET BY MOUTH EVERYDAY AT BEDTIME 90 tablet 3   Multiple Vitamins-Minerals (PRESERVISION AREDS 2) CAPS Take 1 capsule by mouth daily.     nitroGLYCERIN  (NITROSTAT ) 0.4 MG SL tablet PLACE 1 TABLET ON THE TONGUE &LET DISSOLVE EVERY 5 MINS AS NEEDED FOR CHEST PAIN 25 tablet 1   olmesartan  (BENICAR ) 20 MG tablet Take 1 tablet (20 mg total) by mouth daily. 90 tablet 3   omeprazole  (PRILOSEC) 10 MG capsule TAKE 1 CAPSULE (10 MG  TOTAL) BY MOUTH DAILY AS NEEDED. 90 capsule 1   oxyCODONE  (OXY IR/ROXICODONE ) 5 MG immediate release tablet Take 1 tablet (5 mg total) by mouth every 6 (six) hours as needed for severe pain. 12 tablet 0   rosuvastatin  (CRESTOR ) 40 MG tablet Take 1 tablet (40 mg total) by mouth daily. NEED APPOINTMENT 90 tablet 3   Tiotropium Bromide Monohydrate  (SPIRIVA  RESPIMAT) 2.5 MCG/ACT AERS Inhale 2 puffs into the lungs daily. 1 g 5   vitamin B-12 (CYANOCOBALAMIN) 100 MCG tablet Take 100 mcg by mouth daily.     Vitamin E 400 units TABS Take 400 Units by mouth daily.     No current facility-administered medications for this visit.    Allergies  Allergen Reactions   Bisphosphonates Other (See Comments)    Osteonecrosis Jaw      REVIEW OF SYSTEMS:  [X]  denotes positive finding, [ ]  denotes negative finding Cardiac  Comments:  Chest pain or chest pressure:    Shortness of breath upon exertion:    Short of breath when lying flat:    Irregular heart rhythm:        Vascular    Pain in calf, thigh, or hip brought on by ambulation:    Pain in feet at night that wakes you up from your sleep:     Blood clot in your veins:    Leg swelling:         Pulmonary    Oxygen at home:    Productive cough:     Wheezing:         Neurologic    Sudden weakness in arms or legs:     Sudden numbness in arms or legs:     Sudden onset of  difficulty speaking or slurred speech:    Temporary loss of vision in one eye:     Problems with dizziness:         Gastrointestinal    Blood in stool:     Vomited blood:         Genitourinary    Burning when urinating:     Blood in urine:        Psychiatric    Major depression:         Hematologic    Bleeding problems:    Problems with blood clotting too easily:        Skin    Rashes or ulcers:        Constitutional    Fever or chills:      PHYSICAL EXAMINATION:  There were no vitals filed for this visit.  General:  WDWN in NAD; vital signs documented above Gait: Not observed HENT: WNL, normocephalic Pulmonary: normal non-labored breathing , without wheezing Cardiac: regular HR Abdomen: soft, NT, no masses Skin: without rashes Vascular Exam/Pulses:  Right Left  Radial 2+ (normal) 2+ (normal)  Ulnar    Femoral 2+ (normal) 2+ (normal)  Popliteal    DP absent absent  PT     Extremities: without ischemic changes, without Gangrene , without cellulitis; without open wounds;  Musculoskeletal: no muscle wasting or atrophy  Neurologic: A&O X 3;  No focal weakness or paresthesias are detected Psychiatric:  The pt has Normal affect.   Non-Invasive Vascular Imaging:     Summary:  Abdominal Aorta: There is evidence of abnormal dilatation of the distal  Abdominal aorta.  The largest dilitation in the distal aorta measures. 2.25 x 2.12 cm.    *See table(s) above for measurements and observations.  ABI Findings:  +---------+------------------+-----+----------+--------+  Right   Rt Pressure (mmHg)IndexWaveform  Comment   +---------+------------------+-----+----------+--------+  Brachial 197                                        +---------+------------------+-----+----------+--------+  PTA     138               0.70 monophasic          +---------+------------------+-----+----------+--------+  DP      146               0.74 monophasic           +---------+------------------+-----+----------+--------+  Great Toe71                0.36 Abnormal            +---------+------------------+-----+----------+--------+   +---------+------------------+-----+----------+-------+  Left    Lt Pressure (mmHg)IndexWaveform  Comment  +---------+------------------+-----+----------+-------+  Brachial 171                                       +---------+------------------+-----+----------+-------+  PTA     156               0.79 monophasic         +---------+------------------+-----+----------+-------+  DP      139               0.71 monophasic         +---------+------------------+-----+----------+-------+  Great Toe73                0.37 Abnormal           +---------+------------------+-----+----------+-------+    ASSESSMENT/PLAN: Terriann Difonzo is a 73 y.o. female presenting with vascular disease and multiple arterial beds.  She has bilateral lower extremity peripheral arterial disease.  She had short distance claudication which improved with the use of Pletal . We had a long discussion about this, most notably that blockages not to be fixed as long as she is symptom-free.  ABIs were reviewed demonstrating mild to moderate disease.  At this point, I asked that she continue Pletal , and work toward smoking cessation.  My plan is to see her in 1 years time to assess lower extremity perfusion.  I asked her to call my office should new onset rest pain or tissue loss develop in the feet.  Regarding the concern for AAA.  The aorta was small on duplex ultrasound.  There appears to be some abnormal dilation, likely PACU from atherosclerotic disease and continued smoking.  We will follow this on a yearly basis.  Regarding the left carotid subclavian.  She had a palpable left radial pulse on today's exam.  She is symptom-free.  This has not been studied since 2022.  We discussed obtaining carotid duplex ultrasound and  bypass ultrasound to ensure there are no areas of stenosis.  I will call her with these results.   Fonda FORBES Rim, MD [facility] [phone] Total time of patient care including pre-visit research, consultation, and documentation greater than 45 minutes

## 2023-07-24 ENCOUNTER — Ambulatory Visit (HOSPITAL_COMMUNITY)
Admission: RE | Admit: 2023-07-24 | Discharge: 2023-07-24 | Disposition: A | Discharge status: Home / Self Care | Payer: Medicare Other | Source: Ambulatory Visit | Attending: Vascular Surgery | Admitting: Vascular Surgery

## 2023-07-24 ENCOUNTER — Encounter: Payer: Self-pay | Admitting: Vascular Surgery

## 2023-07-24 ENCOUNTER — Ambulatory Visit (INDEPENDENT_AMBULATORY_CARE_PROVIDER_SITE_OTHER)
Admission: RE | Admit: 2023-07-24 | Discharge: 2023-07-24 | Disposition: A | Discharge status: Home / Self Care | Payer: Medicare Other | Source: Ambulatory Visit | Attending: Vascular Surgery | Admitting: Vascular Surgery

## 2023-07-24 ENCOUNTER — Ambulatory Visit (INDEPENDENT_AMBULATORY_CARE_PROVIDER_SITE_OTHER): Payer: Medicare Other | Admitting: Vascular Surgery

## 2023-07-24 ENCOUNTER — Ambulatory Visit (HOSPITAL_COMMUNITY): Payer: Medicare Other

## 2023-07-24 VITALS — BP 155/81 | HR 66 | Temp 97.8°F | Resp 20 | Ht 67.0 in | Wt 105.0 lb

## 2023-07-24 DIAGNOSIS — Z9889 Other specified postprocedural states: Secondary | ICD-10-CM | POA: Diagnosis not present

## 2023-07-24 DIAGNOSIS — I714 Abdominal aortic aneurysm, without rupture, unspecified: Secondary | ICD-10-CM | POA: Diagnosis not present

## 2023-07-24 DIAGNOSIS — I739 Peripheral vascular disease, unspecified: Secondary | ICD-10-CM | POA: Insufficient documentation

## 2023-07-24 DIAGNOSIS — I7789 Other specified disorders of arteries and arterioles: Secondary | ICD-10-CM

## 2023-07-24 LAB — VAS US ABI WITH/WO TBI
Left ABI: 0.79
Right ABI: 0.74

## 2023-07-28 ENCOUNTER — Other Ambulatory Visit: Payer: Self-pay | Admitting: Internal Medicine

## 2023-07-28 DIAGNOSIS — M810 Age-related osteoporosis without current pathological fracture: Secondary | ICD-10-CM

## 2023-07-31 ENCOUNTER — Other Ambulatory Visit: Payer: Self-pay | Admitting: Internal Medicine

## 2023-07-31 DIAGNOSIS — Z1231 Encounter for screening mammogram for malignant neoplasm of breast: Secondary | ICD-10-CM

## 2023-08-04 ENCOUNTER — Other Ambulatory Visit: Payer: Self-pay

## 2023-08-04 DIAGNOSIS — I6523 Occlusion and stenosis of bilateral carotid arteries: Secondary | ICD-10-CM

## 2023-08-06 ENCOUNTER — Ambulatory Visit: Payer: Medicare Other

## 2023-08-20 ENCOUNTER — Ambulatory Visit
Admission: RE | Admit: 2023-08-20 | Discharge: 2023-08-20 | Disposition: A | Discharge status: Home / Self Care | Payer: Medicare Other | Source: Ambulatory Visit | Attending: Internal Medicine

## 2023-08-20 DIAGNOSIS — Z1231 Encounter for screening mammogram for malignant neoplasm of breast: Secondary | ICD-10-CM

## 2023-09-04 ENCOUNTER — Ambulatory Visit (INDEPENDENT_AMBULATORY_CARE_PROVIDER_SITE_OTHER): Payer: Medicare Other | Admitting: Vascular Surgery

## 2023-09-04 ENCOUNTER — Other Ambulatory Visit (HOSPITAL_COMMUNITY): Payer: Medicare Other

## 2023-09-04 ENCOUNTER — Ambulatory Visit (HOSPITAL_COMMUNITY)
Admission: RE | Admit: 2023-09-04 | Discharge: 2023-09-04 | Disposition: A | Discharge status: Home / Self Care | Payer: Medicare Other | Source: Ambulatory Visit | Attending: Vascular Surgery | Admitting: Vascular Surgery

## 2023-09-04 DIAGNOSIS — I6523 Occlusion and stenosis of bilateral carotid arteries: Secondary | ICD-10-CM | POA: Diagnosis not present

## 2023-09-04 DIAGNOSIS — T82898D Other specified complication of vascular prosthetic devices, implants and grafts, subsequent encounter: Secondary | ICD-10-CM

## 2023-09-04 NOTE — Progress Notes (Signed)
 Office Note      HPI: [name] is a 73 y.o. (07-28-1950) female presenting in follow-up with arterial disease in multiple vascular beds.  Workup at her last appointment did not include carotid insonation.  [name] has known claudication bilaterally, aortic ectasia, mild carotid stenosis, and previous history of left carotid subclavian bypass.  On exam, [name] was doing well.  A native of [place], she moved to [place] to young age.  She continues to work full-time at [employer], and loves what she does.  She is a mother of 2, and a grandmother of 2.  She has previously seen Dr. Allyson Sabal for lower extremity claudication and underwent angiography demonstrating inflow disease, as well as bilateral SFA occlusions. Other surgical history includes left carotid subclavian bypass, CABG.  She has a known AAA appreciated on recent angiography.  Recent claudication symptoms improved with the use of Pletal.  At her last visit with Dr. Allyson Sabal, [name] noted significant improvement in bilateral lower extremity symptoms with the use of Pletal.  She continues to be symptom-free on today's visit.  [name] denies significant claudication, no ischemic rest pain, no tissue loss.  She continues to smoke on a daily basis.  No family history of AAA, no vertebrobasilar symptoms, angina, pain or cramping with left upper extremity use.   The pt is  on a statin for cholesterol management.  The pt is  on a daily aspirin.   Other AC:  pletal The pt is  on medication for hypertension.   The pt is not diabetic.  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, jaw (HCC)    Peripheral vascular disease (HCC)    carotid to subclavian artery bypass graft   Smoker     Past Surgical History:  Procedure Laterality Date   ABDOMINAL AORTOGRAM W/LOWER EXTREMITY N/A 11/21/2022   Procedure: ABDOMINAL AORTOGRAM W/LOWER EXTREMITY;  Surgeon: Runell Gess, MD;  Location: [facility] INVASIVE CV LAB;  Service: Cardiovascular;  Laterality: N/A;   CAROTID ARTERY - SUBCLAVIAN ARTERY BYPASS GRAFT Left 04/18/2007   COLONOSCOPY  2008   CORONARY ARTERY BYPASS GRAFT     CORONARY/GRAFT ACUTE MI REVASCULARIZATION N/A 05/12/2017   Procedure: Coronary/Graft Acute MI Revascularization;  Surgeon: Tonny Bollman, MD;  Location: [facility] INVASIVE CV LAB;  Service: Cardiovascular;  Laterality: N/A;   DEBRIDEMENT MANDIBLE Left 03/23/2019   Procedure: DEBRIDEMENT LEFT MANDIBLE,SEQUESTRECTOMY;  Surgeon: Vivia Ewing, DMD;  Location: [facility] OR;  Service: Oral Surgery;  Laterality: Left;   EYE SURGERY Bilateral 2020   cataract removal   KIDNEY SURGERY Left    73 yo   LEFT HEART CATH AND CORONARY ANGIOGRAPHY N/A 05/12/2017   Procedure: LEFT HEART CATH AND CORONARY ANGIOGRAPHY;  Surgeon: Tonny Bollman, MD;  Location: [facility] INVASIVE CV LAB;  Service: Cardiovascular;  Laterality: N/A;   Left Nephrectomy Left 10/17/1983   Dr. Sural---StagHornCalculus, Left Kidney Nonfunctioning wiht Acute and Chronic Pyelonephritis   WISDOM TOOTH EXTRACTION     XI ROBOTIC ASSISTED INGUINAL HERNIA REPAIR WITH  MESH Left 08/30/2022   Procedure: ROBOTIC LEFT INGUINAL HERNIA REPAIR WITH MESH;  Surgeon: Kinsinger, De Blanch, MD;  Location: [facility] OR[facility];  Service: General;  Laterality: Left;    Social History   Socioeconomic History   Marital status: Married    Spouse name: Not on file   Number of children: 2   Years of education: Not on file   Highest education level: Not on file  Occupational History   Not on file   Tobacco Use   Smoking status: Every Day    Current packs/day: 0.50    Average packs/day: 0.5 packs/day for 40.0 years (20.0 ttl pk-yrs)    Types: Cigarettes   Smokeless tobacco: Never  Vaping Use   Vaping status: Never Used  Substance and Sexual Activity   Alcohol use: Yes    Comment: Occasional Beer   Drug use: No   Sexual activity: Yes    Birth control/protection: Post-menopausal  Other Topics Concern   Not on file  Social History Narrative   Lives alone.     Social Drivers of Corporate investment banker Strain: Not on file  Food Insecurity: Not on file  Transportation Needs: Not on file  Physical Activity: Not on file  Stress: Not on file  Social Connections: Not on file  Intimate Partner Violence: Not on file   Family History  Problem Relation Age of Onset   Depression Sister    COPD Father    Cancer Sister        hysterectomy sec to cancer   Cancer Sister        cervical cancer   COPD Brother     Current Outpatient Medications  Medication Sig Dispense Refill   albuterol (VENTOLIN HFA) 108 (90 Base) MCG/ACT inhaler Inhale 1-2 puffs into the lungs every 6 (six) hours as needed for wheezing or shortness of breath. 18 g 2   ALPRAZolam (XANAX) 0.25 MG tablet TAKE 1 TABLET BY MOUTH EVERY DAY (Patient taking differently: Take 0.25 mg by mouth daily as needed for anxiety.) 30 tablet 0   amLODipine (NORVASC) 10 MG tablet Take 1 tablet (10 mg total) by mouth daily. 90 tablet 3   aspirin 81 MG tablet Take 81 mg by mouth daily.     calcipotriene (DOVONOX) 0.005 % cream Apply 1 Application topically daily as needed (top of foot).     carvedilol (COREG) 3.125 MG tablet Take 1 tablet (3.125 mg total) by mouth 2 (two) times daily with a meal. 180 tablet 3   cilostazol (PLETAL) 50 MG tablet TAKE 1 TABLET(50 MG) BY MOUTH TWICE DAILY 180 tablet 3   desonide (DESOWEN) 0.05 % cream Apply 1 Application topically daily as needed (head).     diphenhydrAMINE (BENADRYL) 25 mg capsule  Take 25 mg by mouth every 6 (six) hours as needed.     ezetimibe (ZETIA) 10 MG tablet Take 1 tablet (10 mg total) by mouth daily. For cholesterol 90 tablet 3   fexofenadine (ALLEGRA) 180 MG tablet Take 1 tablet (180 mg total) by mouth daily. 30 tablet 5   montelukast (SINGULAIR) 10 MG tablet TAKE 1 TABLET BY MOUTH EVERYDAY AT BEDTIME 90 tablet 3   Multiple Vitamins-Minerals (PRESERVISION AREDS 2) CAPS Take 1 capsule by mouth daily.     nitroGLYCERIN (NITROSTAT) 0.4 MG SL tablet PLACE 1 TABLET ON THE TONGUE &LET DISSOLVE EVERY 5 MINS AS NEEDED FOR CHEST PAIN 25 tablet 1   olmesartan (BENICAR) 20 MG tablet Take 1 tablet (20  mg total) by mouth daily. 90 tablet 3   omeprazole (PRILOSEC) 10 MG capsule TAKE 1 CAPSULE (10 MG TOTAL) BY MOUTH DAILY AS NEEDED. 90 capsule 1   oxyCODONE (OXY IR/ROXICODONE) 5 MG immediate release tablet Take 1 tablet (5 mg total) by mouth every 6 (six) hours as needed for severe pain. 12 tablet 0   rosuvastatin (CRESTOR) 40 MG tablet Take 1 tablet (40 mg total) by mouth daily. NEED APPOINTMENT 90 tablet 3   Tiotropium Bromide Monohydrate (SPIRIVA RESPIMAT) 2.5 MCG/ACT AERS Inhale 2 puffs into the lungs daily. 1 g 5   vitamin B-12 (CYANOCOBALAMIN) 100 MCG tablet Take 100 mcg by mouth daily.     Vitamin E 400 units TABS Take 400 Units by mouth daily.     No current facility-administered medications for this visit.    Allergies  Allergen Reactions   Bisphosphonates Other (See Comments)    Osteonecrosis Jaw      REVIEW OF SYSTEMS:  [X]  denotes positive finding, [ ]  denotes negative finding Cardiac  Comments:  Chest pain or chest pressure:    Shortness of breath upon exertion:    Short of breath when lying flat:    Irregular heart rhythm:        Vascular    Pain in calf, thigh, or hip brought on by ambulation:    Pain in feet at night that wakes you up from your sleep:     Blood clot in your veins:    Leg swelling:         Pulmonary    Oxygen at home:     Productive cough:     Wheezing:         Neurologic    Sudden weakness in arms or legs:     Sudden numbness in arms or legs:     Sudden onset of difficulty speaking or slurred speech:    Temporary loss of vision in one eye:     Problems with dizziness:         Gastrointestinal    Blood in stool:     Vomited blood:         Genitourinary    Burning when urinating:     Blood in urine:        Psychiatric    Major depression:         Hematologic    Bleeding problems:    Problems with blood clotting too easily:        Skin    Rashes or ulcers:        Constitutional    Fever or chills:      PHYSICAL EXAMINATION:  There were no vitals filed for this visit.  General:  WDWN in NAD; vital signs documented above Gait: Not observed HENT: WNL, normocephalic Pulmonary: normal non-labored breathing , without wheezing Cardiac: regular HR Abdomen: soft, NT, no masses Skin: without rashes Vascular Exam/Pulses:  Right Left  Radial 2+ (normal) 2+ (normal)  Ulnar    Femoral 2+ (normal) 2+ (normal)  Popliteal    DP absent absent  PT     Extremities: without ischemic changes, without Gangrene , without cellulitis; without open wounds;  Musculoskeletal: no muscle wasting or atrophy  Neurologic: A&O X 3;  No focal weakness or paresthesias are detected Psychiatric:  The pt has Normal affect.   Non-Invasive Vascular Imaging:     Summary:  Abdominal Aorta: There is evidence of abnormal dilatation of the distal  Abdominal aorta.  The largest  dilitation in the distal aorta measures. 2.25 x 2.12 cm.    *See table(s) above for measurements and observations.    ABI Findings:  +---------+------------------+-----+----------+--------+  Right   Rt Pressure (mmHg)IndexWaveform  Comment   +---------+------------------+-----+----------+--------+  Brachial 197                                        +---------+------------------+-----+----------+--------+  PTA     138                0.70 monophasic          +---------+------------------+-----+----------+--------+  DP      146               0.74 monophasic          +---------+------------------+-----+----------+--------+  Great Toe71                0.36 Abnormal            +---------+------------------+-----+----------+--------+   +---------+------------------+-----+----------+-------+  Left    Lt Pressure (mmHg)IndexWaveform  Comment  +---------+------------------+-----+----------+-------+  Brachial 171                                       +---------+------------------+-----+----------+-------+  PTA     156               0.79 monophasic         +---------+------------------+-----+----------+-------+  DP      139               0.71 monophasic         +---------+------------------+-----+----------+-------+  Great Toe73                0.37 Abnormal           +---------+------------------+-----+----------+-------+    ASSESSMENT/PLAN: [name] is a 73 y.o. female presenting with vascular disease and multiple arterial beds.  Today's visit was simply to give results of the recent carotid study. Regarding the left carotid subclavian bypass-this is known to be occluded.  No angina.  Mild bilateral carotid artery disease-we will follow this on a yearly basis.    She has bilateral lower extremity peripheral arterial disease.  She had short distance claudication which improved with the use of Pletal.  Symptoms are stable. We had a long discussion about this, most notably that blockages not to be fixed as long as she is symptom-free.  At her last visit, ABIs were reviewed demonstrating mild to moderate disease. I asked that she continue Pletal, and work toward smoking cessation.  My plan is to see her in 1 years time to assess lower extremity perfusion.  I asked her to call my office should new onset rest pain or tissue loss develop in the feet.  Regarding the  concern for AAA.  The aorta was small on duplex ultrasound.  There appears to be some abnormal dilation, likely PACU from atherosclerotic disease and continued smoking.  We will follow this on a yearly basis.     Victorino Sparrow, MD [facility] [phone]

## 2023-09-05 ENCOUNTER — Other Ambulatory Visit: Payer: Self-pay | Admitting: *Deleted

## 2023-09-05 NOTE — Progress Notes (Signed)
 Chart opened in error

## 2023-10-05 ENCOUNTER — Other Ambulatory Visit: Payer: Self-pay | Admitting: Student

## 2023-10-09 ENCOUNTER — Telehealth: Payer: Self-pay | Admitting: *Deleted

## 2023-10-09 NOTE — Telephone Encounter (Signed)
   Name: [name]  DOB: Nov 30, 1950  MRN: [id number]  Primary Cardiologist: Eilleen Grates, MD   Preoperative team, please contact this patient and set up a phone call appointment for further preoperative risk assessment. Please obtain consent and complete medication review. Thank you for your help.  I confirm that guidance regarding antiplatelet and oral anticoagulation therapy has been completed and, if necessary, noted below.  Regarding ASA therapy, we recommend continuation of ASA throughout the perioperative period. However, if the surgeon feels that cessation of ASA is required in the perioperative period, it may be stopped 5-7 days prior to surgery with a plan to resume it as soon as felt to be feasible from a surgical standpoint in the post-operative period.    I also confirmed the patient resides in the state of [place] . As per [facility] Medical Board telemedicine laws, the patient must reside in the state in which the provider is licensed.   Carie Charity, NP 10/09/2023, 3:12 PM [facility]

## 2023-10-09 NOTE — Telephone Encounter (Signed)
 Lvm to call back for clearance appointment

## 2023-10-09 NOTE — Telephone Encounter (Signed)
   Pre-operative Risk Assessment    Patient Name: [name]  DOB: 08/09/1950 MRN: [id number]   Date of last office visit: 04/21/23 Date of next office visit: N/A   Request for Surgical Clearance    Procedure:   LEFT REVERSE TOTAL SHOULDER ARTHROPLASTY  Date of Surgery:  Clearance TBD                                Surgeon:  DR. Marionette Sick Surgeon's Group or Practice Name:  [facility] Phone number:  [phone] Fax number:  [phone]   Type of Clearance Requested:   - Medical  - Pharmacy:  Hold Aspirin  NOT INDICATED   Type of Anesthesia:  General    Additional requests/questions:    Berenda Breaker   10/09/2023, 3:03 PM

## 2023-10-09 NOTE — Telephone Encounter (Signed)
  Patient Consent for Virtual Visit       [name] has provided verbal consent on 10/09/2023 for a virtual visit (video or telephone).   CONSENT FOR VIRTUAL VISIT FOR:  [name]  By participating in this virtual visit I agree to the following:  I hereby voluntarily request, consent and authorize [facility] and its employed or contracted physicians, physician assistants, nurse practitioners or other licensed health care professionals (the Practitioner), to provide me with telemedicine health care services (the "Services") as deemed necessary by the treating Practitioner. I acknowledge and consent to receive the Services by the Practitioner via telemedicine. I understand that the telemedicine visit will involve communicating with the Practitioner through live audiovisual communication technology and the disclosure of certain medical information by electronic transmission. I acknowledge that I have been given the opportunity to request an in-person assessment or other available alternative prior to the telemedicine visit and am voluntarily participating in the telemedicine visit.  I understand that I have the right to withhold or withdraw my consent to the use of telemedicine in the course of my care at any time, without affecting my right to future care or treatment, and that the Practitioner or I may terminate the telemedicine visit at any time. I understand that I have the right to inspect all information obtained and/or recorded in the course of the telemedicine visit and may receive copies of available information for a reasonable fee.  I understand that some of the potential risks of receiving the Services via telemedicine include:  Delay or interruption in medical evaluation due to technological equipment failure or disruption; Information transmitted may not be sufficient (e.g. poor resolution of images) to allow for appropriate medical decision making by the Practitioner;  and/or  In rare instances, security protocols could fail, causing a breach of personal health information.  Furthermore, I acknowledge that it is my responsibility to provide information about my medical history, conditions and care that is complete and accurate to the best of my ability. I acknowledge that Practitioner's advice, recommendations, and/or decision may be based on factors not within their control, such as incomplete or inaccurate data provided by me or distortions of diagnostic images or specimens that may result from electronic transmissions. I understand that the practice of medicine is not an exact science and that Practitioner makes no warranties or guarantees regarding treatment outcomes. I acknowledge that a copy of this consent can be made available to me via my patient portal [facility] MyChart), or I can request a printed copy by calling the office of [facility].    I understand that my insurance will be billed for this visit.   I have read or had this consent read to me. I understand the contents of this consent, which adequately explains the benefits and risks of the Services being provided via telemedicine.  I have been provided ample opportunity to ask questions regarding this consent and the Services and have had my questions answered to my satisfaction. I give my informed consent for the services to be provided through the use of telemedicine in my medical care

## 2023-10-14 ENCOUNTER — Ambulatory Visit: Attending: Cardiovascular Disease

## 2023-10-14 DIAGNOSIS — Z01810 Encounter for preprocedural cardiovascular examination: Secondary | ICD-10-CM

## 2023-10-14 NOTE — Progress Notes (Signed)
 Virtual Visit via Telephone Note   Because of Janus Raad co-morbid illnesses, she is at least at moderate risk for complications without adequate follow up.  This format is felt to be most appropriate for this patient at this time.  Due to technical limitations with video connection (technology), today's appointment will be conducted as an audio only telehealth visit, and [name] verbally agreed to proceed in this manner.   All issues noted in this document were discussed and addressed.  No physical exam could be performed with this format.  Evaluation Performed:  Preoperative cardiovascular risk assessment _____________   Date:  10/14/2023   Patient ID:  [name], DOB 04-04-1951, MRN [id number] Patient Location:  Home Provider location:   Office  Primary Care Provider:  Azalia Leo, MD Primary Cardiologist:  Eilleen Grates, MD  Chief Complaint / Patient Profile   73 y.o. y/o female with a h/o subclavian artery stenosis, hypertension, coronary artery disease, ischemic cardiomyopathy, hyperlipidemia who is pending left reverse total shoulder arthroplasty and presents today for telephonic preoperative cardiovascular risk assessment.  History of Present Illness    [name] is a 73 y.o. female who presents via audio/video conferencing for a telehealth visit today.  Pt was last seen in cardiology clinic on 04/21/2023 by Dr. Katheryne Pane.  At that time [name] was doing well .  The patient is now pending procedure as outlined above. Since her last visit, she remained stable from a cardiac standpoint.  Today she denies chest pain, shortness of breath, lower extremity edema, fatigue, palpitations, melena, hematuria, hemoptysis, diaphoresis, weakness, presyncope, syncope, orthopnea, and PND.   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, jaw (HCC)    Peripheral vascular disease (HCC)    carotid to subclavian artery bypass graft   Smoker    Past Surgical History:  Procedure Laterality Date   ABDOMINAL AORTOGRAM W/LOWER EXTREMITY N/A 11/21/2022   Procedure: ABDOMINAL AORTOGRAM W/LOWER EXTREMITY;  Surgeon: Avanell Leigh, MD;  Location: [facility] INVASIVE CV LAB;  Service: Cardiovascular;  Laterality: N/A;   CAROTID ARTERY - SUBCLAVIAN ARTERY BYPASS GRAFT Left 04/18/2007   COLONOSCOPY  2008   CORONARY ARTERY BYPASS GRAFT     CORONARY/GRAFT ACUTE MI REVASCULARIZATION N/A 05/12/2017   Procedure: Coronary/Graft Acute MI Revascularization;  Surgeon: Arnoldo Lapping, MD;  Location: [facility] INVASIVE CV LAB;  Service: Cardiovascular;  Laterality: N/A;   DEBRIDEMENT MANDIBLE Left 03/23/2019   Procedure: DEBRIDEMENT LEFT MANDIBLE,SEQUESTRECTOMY;  Surgeon: Joseph Nickel, DMD;  Location: [facility] OR;  Service: Oral Surgery;  Laterality: Left;   EYE SURGERY Bilateral 2020   cataract removal   KIDNEY SURGERY Left    73 yo   LEFT HEART CATH AND CORONARY ANGIOGRAPHY N/A 05/12/2017   Procedure: LEFT HEART CATH AND CORONARY ANGIOGRAPHY;  Surgeon: Arnoldo Lapping, MD;  Location[facility] INVASIVE CV LAB;  Service: Cardiovascular;  Laterality: N/A;   Left Nephrectomy Left 10/17/1983   Dr. Sural---StagHornCalculus, Left Kidney Nonfunctioning wiht Acute and Chronic Pyelonephritis  WISDOM TOOTH EXTRACTION     XI ROBOTIC ASSISTED INGUINAL HERNIA REPAIR WITH MESH Left 08/30/2022   Procedure: ROBOTIC LEFT INGUINAL HERNIA REPAIR WITH MESH;  Surgeon: Kinsinger, Alphonso Aschoff, MD;  Location: [facility] ORS;  Service: General;  Laterality: Left;     Allergies  Allergies  Allergen Reactions   Bisphosphonates Other (See Comments)    Osteonecrosis Jaw     Home Medications    Prior to Admission medications   Medication Sig Start Date End Date Taking? Authorizing Provider  albuterol  (VENTOLIN  HFA) 108 (90 Base) MCG/ACT inhaler Inhale 1-2 puffs into the lungs every 6 (six) hours as needed for wheezing or shortness of breath. 10/28/19   [place], Marvell Slider, MD  ALPRAZolam  (XANAX ) 0.25 MG tablet TAKE 1 TABLET BY MOUTH EVERY DAY Patient taking differently: Take 0.25 mg by mouth daily as needed for anxiety. 09/18/20   Austine Lefort, MD  amLODipine  (NORVASC ) 10 MG tablet Take 1 tablet (10 mg total) by mouth daily. 07/31/21   Eilleen Grates, MD  aspirin  81 MG tablet Take 81 mg by mouth daily.    [provider]  calcipotriene (DOVONOX) 0.005 % cream Apply 1 Application topically daily as needed (top of foot).    [provider]  carvedilol  (COREG ) 3.125 MG tablet Take 1 tablet (3.125 mg total) by mouth 2 (two) times daily with a meal. 07/31/21   Eilleen Grates, MD  cilostazol  (PLETAL ) 50 MG tablet TAKE 1 TABLET(50 MG) BY MOUTH TWICE DAILY 07/02/23   Berry, Jonathan J, MD  desonide (DESOWEN) 0.05 % cream Apply 1 Application topically daily as needed (head).    [provider]  diphenhydrAMINE (BENADRYL) 25 mg capsule Take 25 mg by mouth every 6 (six) hours as needed.    [provider]  ezetimibe  (ZETIA ) 10 MG tablet Take 1 tablet (10 mg total) by mouth daily. For cholesterol 07/31/21   Eilleen Grates, MD  fexofenadine  (ALLEGRA ) 180 MG tablet Take 1 tablet (180 mg total) by mouth daily. 05/28/23   Wilfredo Hanly, MD  montelukast  (SINGULAIR ) 10 MG tablet TAKE 1 TABLET BY MOUTH EVERYDAY AT BEDTIME 06/03/22   Wilfredo Hanly, MD  Multiple Vitamins-Minerals (PRESERVISION AREDS 2) CAPS Take 1 capsule by mouth daily.    [provider]  nitroGLYCERIN  (NITROSTAT ) 0.4 MG SL tablet PLACE 1 TABLET ON  THE TONGUE &LET DISSOLVE EVERY 5 MINS AS NEEDED FOR CHEST PAIN 07/31/20   Mathis Som, MD  olmesartan  (BENICAR ) 20 MG tablet TAKE 1 TABLET BY MOUTH EVERY DAY 10/07/23   Avanell Leigh, MD  omeprazole  (PRILOSEC) 10 MG capsule TAKE 1 CAPSULE (10 MG TOTAL) BY MOUTH DAILY AS NEEDED. 07/26/20   Wilhemena Harbour, NP  oxyCODONE  (OXY IR/ROXICODONE ) 5 MG immediate release tablet Take 1 tablet (5 mg total) by mouth every 6 (six) hours as needed for severe pain. 08/30/22   Kinsinger, Alphonso Aschoff, MD  rosuvastatin  (CRESTOR ) 40 MG tablet Take 1 tablet (40 mg total) by mouth daily. NEED APPOINTMENT 07/31/21   Eilleen Grates, MD  Tiotropium Bromide Monohydrate  (SPIRIVA  RESPIMAT) 2.5 MCG/ACT AERS Inhale 2 puffs into the lungs daily. 05/28/23   Wilfredo Hanly, MD  vitamin B-12 (CYANOCOBALAMIN) 100 MCG tablet Take 100 mcg by mouth daily.    [provider]  Vitamin E 400 units TABS Take 400 Units by mouth daily.    [provider]    Physical Exam    Vital Signs:  [name] does not have vital signs  available for review today.  Given telephonic nature of communication, physical exam is limited. AAOx3. NAD. Normal affect.  Speech and respirations are unlabored.  Accessory Clinical Findings    None  Assessment & Plan    1.  Preoperative Cardiovascular Risk Assessment:Procedure:   LEFT REVERSE TOTAL SHOULDER ARTHROPLASTY   Date of Surgery:  Clearance TBD                                  Surgeon:  DR. Marionette Sick Surgeon's Group or Practice Name:  [facility] Phone number:  [phone] Fax number:  [phone]     Primary Cardiologist: Eilleen Grates, MD  Chart reviewed as part of pre-operative protocol coverage. Given past medical history and time since last visit, based on ACC/AHA guidelines, [name] would be at acceptable risk for the planned procedure without further cardiovascular testing.   Her RCRI is moderate risk, 6.6% risk of major cardiac event.   She is able to complete greater than 4 METS of physical activity.  Patient was advised that if she develops new symptoms prior to surgery to contact our office to arrange a follow-up appointment.  He verbalized understanding.  Regarding ASA therapy, we recommend continuation of ASA throughout the perioperative period. However, if the surgeon feels that cessation of ASA is required in the perioperative period, it may be stopped 5-7 days prior to surgery with a plan to resume it as soon as felt to be feasible from a surgical standpoint in the post-operative period.   I will route this recommendation to the requesting party via Epic fax function and remove from pre-op pool.      Time:   Today, I have spent 5 minutes with the patient with telehealth technology discussing medical history, symptoms, and management plan.  I spent 10 minutes reviewing past medical history, cardiac medications, and cardiac tests   Carie Charity, NP  10/14/2023, 6:54 AM

## 2023-10-15 ENCOUNTER — Telehealth: Payer: Self-pay

## 2023-10-15 NOTE — Telephone Encounter (Signed)
 Copied from CRM [id number]. Topic: Appointments - Scheduling Inquiry for Clinic >> Oct 15, 2023  8:57 AM Crist Dominion wrote: Reason for CRM: Patient is requesting surgical clearance from Dr. Diania Fortes prior to her shoulder surgery, but the surgery can't be scheduled until given clearance. Patient would like to know if Dr. Diania Fortes can sign off on this surgery without her coming to see him for an appointment (last seen 12/24) and if not can he open a sooner appointment slot so that she may be seen sooner than July.

## 2023-10-15 NOTE — Telephone Encounter (Signed)
 ARISCAT Score for Postoperative Pulmonary Complications Low risk 1.6% risk of in-hospital post-op pulmonary complications (composite including respiratory failure, respiratory infection, pleural effusion, atelectasis, pneumothorax, bronchospasm, aspiration pneumonitis)  Duaine German, MD [facility] Office: [phone]

## 2023-10-16 NOTE — Telephone Encounter (Signed)
 Copy of this encounter was faxed to [facility] [phone].

## 2023-11-04 ENCOUNTER — Telehealth (HOSPITAL_BASED_OUTPATIENT_CLINIC_OR_DEPARTMENT_OTHER): Payer: Self-pay

## 2023-11-04 NOTE — Telephone Encounter (Signed)
 Copied from CRM [id number]. Topic: Clinical - Prescription Issue >> Nov 04, 2023  9:22 AM Justina Oman C wrote: Reason for CRM: Patient states does not like the Spiriva  respimat inhaler Dr. Diania Fortes prescribed it's complicated to use, so she's unable to use it. Patient would like to go back to the old inhaler and allergy medications: Montelukast  10 mg 1 tablet at bedtime and Trelegy 100-6.25-25 mcg 1-2 puffs daily prn. Please advise and call back.   [id number] Jonette Nestle, [place] - [address] AT [facility]  [place] [address] [phone]:[phone]

## 2023-11-05 ENCOUNTER — Telehealth: Payer: Self-pay

## 2023-11-05 NOTE — Telephone Encounter (Signed)
 Copied from CRM [id number]. Topic: Appointments - Scheduling Inquiry for Clinic >> Nov 05, 2023 12:01 PM Ilene Malling wrote: Reason for CRM: Patient [phone] was advised to make an appointment with Dr. Diania Fortes for 6 months follow up and medication changes since last office visit was 05/28/23.  Patient states she having shoulder surgery 01/16/24, Dr. Diania Fortes already approved but did not know patient has been having shortness of breath and having to use her inhaler more often. Patient denies shortness of breath now.   Unable to schedule, [employer], no search results found between 11/05/23 and 03/31/24. CAL advised to send a crm. Please advise and call back.   Pt on schedule NFN

## 2023-11-05 NOTE — Telephone Encounter (Signed)
 Pt has been schedule NFN

## 2023-11-12 ENCOUNTER — Encounter: Payer: Self-pay | Admitting: Pulmonary Disease

## 2023-11-12 ENCOUNTER — Ambulatory Visit (INDEPENDENT_AMBULATORY_CARE_PROVIDER_SITE_OTHER): Admitting: Pulmonary Disease

## 2023-11-12 VITALS — BP 142/72 | HR 77 | Ht 67.0 in | Wt 106.0 lb

## 2023-11-12 DIAGNOSIS — I739 Peripheral vascular disease, unspecified: Secondary | ICD-10-CM

## 2023-11-12 DIAGNOSIS — F17210 Nicotine dependence, cigarettes, uncomplicated: Secondary | ICD-10-CM

## 2023-11-12 DIAGNOSIS — J449 Chronic obstructive pulmonary disease, unspecified: Secondary | ICD-10-CM | POA: Diagnosis not present

## 2023-11-12 MED ORDER — TRELEGY ELLIPTA 200-62.5-25 MCG/ACT IN AEPB
1.0000 | INHALATION_SPRAY | Freq: Every day | RESPIRATORY_TRACT | 11 refills | Status: AC
Start: 2023-11-12 — End: ?

## 2023-11-12 NOTE — Progress Notes (Signed)
 Synopsis: Referred in February 2023 for COPD by Marlo Simpler, MD  Subjective:   PATIENT ID: [name] GENDER: female DOB: April 28, 1951, MRN: [id number]  HPI  Chief Complaint  Patient presents with   Follow-up   [name] is a 73 year old woman, daily smoker with coronary artery disease, hypertension and peripheral vascular disease who returns to pulmonary clinic for COPD.   [name] experiences shortness of breath, which she associates with a valve issue identified during a heart attack six to eight years ago. She uses Spiriva  for COPD management but prefers Trelegy due to better control of her symptoms and fewer allergy issues. 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 She is cautious about her diet due to having one kidney and avoids supplemental oxygen during activities.  OV 05/28/23 The patient, with a history of macular degeneration, presents with a complaint of watery eyes for the past five to six months. They describe a sensation of dryness, despite the excessive tearing. They also report episodes of eye discharge, causing their eyes to stick together upon waking. The patient denies any redness in the eyes. They have been using an inhaler (Trelegy) for an unspecified lung condition, and they wonder if this could be contributing to their eye symptoms. They also report using an over-the-counter allergy medication (Benadryl) without relief. The patient has a history of receiving injections for macular degeneration four times a year, but they did not have the watery eye symptoms at their last eye doctor visit. They also report occasional swelling in the eyes, particularly the right eye.  OV 05/02/22 She was started on spiriva  and montelukast  at last visit. She has noticed great benefit. She is using albuterol  about 1 time per day.   Initial OV 07/31/21 She has been experiencing increased cough and shortness of breath over recent  months. The cough is occasionally productive in the mornings and is worse at night. She is waking up 3-4 times per week due to cough. She denies wheezing. She has sinus congestion and drainage along with seasonal allergies. Strong perfumes tend to bother her breathing.   She works for [employer] and has been there for 30 years. She is active at work Therapist, music. She is smoking quarter to half a pack per day. She has been smoking for 20+ years and was previously smoking just under 1 pack per day. She was exposed to second hand smoke in childhood.   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, jaw (HCC)    Peripheral vascular disease (HCC)    carotid to subclavian artery bypass graft   Smoker      Family History  Problem Relation Age of Onset   Depression Sister    COPD Father    Cancer Sister        hysterectomy sec to cancer   Cancer Sister        cervical cancer   COPD Brother      Social History  Socioeconomic History   Marital status: Married    Spouse name: Not on file   Number of children: 2   Years of education: Not on file   Highest education level: Not on file  Occupational History   Not on file  Tobacco Use   Smoking status: Every Day    Current packs/day: 0.50    Average packs/day: 0.5 packs/day for 40.0 years (20.0 ttl pk-yrs)    Types: Cigarettes   Smokeless tobacco: Never  Vaping Use   Vaping status: Never Used  Substance and Sexual Activity   Alcohol use: Yes    Comment: Occasional Beer   Drug use: No   Sexual activity: Yes     Birth control/protection: Post-menopausal  Other Topics Concern   Not on file  Social History Narrative   Lives alone.     Social Drivers of Corporate investment banker Strain: Not on file  Food Insecurity: Not on file  Transportation Needs: Not on file  Physical Activity: Not on file  Stress: Not on file  Social Connections: Not on file  Intimate Partner Violence: Not on file     Allergies  Allergen Reactions   Bisphosphonates Other (See Comments)    Osteonecrosis Jaw      Outpatient Medications Prior to Visit  Medication Sig Dispense Refill   albuterol  (VENTOLIN  HFA) 108 (90 Base) MCG/ACT inhaler Inhale 1-2 puffs into the lungs every 6 (six) hours as needed for wheezing or shortness of breath. 18 g 2   ALPRAZolam  (XANAX ) 0.25 MG tablet TAKE 1 TABLET BY MOUTH EVERY DAY (Patient taking differently: Take 0.25 mg by mouth daily as needed for anxiety.) 30 tablet 0   amLODipine  (NORVASC ) 10 MG tablet Take 1 tablet (10 mg total) by mouth daily. 90 tablet 3   aspirin  81 MG tablet Take 81 mg by mouth daily.     calcipotriene (DOVONOX) 0.005 % cream Apply 1 Application topically daily as needed (top of foot).     carvedilol  (COREG ) 3.125 MG tablet Take 1 tablet (3.125 mg total) by mouth 2 (two) times daily with a meal. 180 tablet 3   cilostazol  (PLETAL ) 50 MG tablet TAKE 1 TABLET(50 MG) BY MOUTH TWICE DAILY 180 tablet 3   desonide (DESOWEN) 0.05 % cream Apply 1 Application topically daily as needed (head).     diphenhydrAMINE (BENADRYL) 25 mg capsule Take 25 mg by mouth every 6 (six) hours as needed.     ezetimibe  (ZETIA ) 10 MG tablet Take 1 tablet (10 mg total) by mouth daily. For cholesterol 90 tablet 3   fexofenadine  (ALLEGRA ) 180 MG tablet Take 1 tablet (180 mg total) by mouth daily. 30 tablet 5   montelukast  (SINGULAIR ) 10 MG tablet TAKE 1 TABLET BY MOUTH EVERYDAY AT BEDTIME 90 tablet 3   Multiple Vitamins-Minerals (PRESERVISION AREDS 2) CAPS Take 1 capsule by mouth daily.      nitroGLYCERIN  (NITROSTAT ) 0.4 MG SL tablet PLACE 1 TABLET ON THE TONGUE &LET DISSOLVE EVERY 5 MINS AS NEEDED FOR CHEST PAIN 25 tablet 1   olmesartan  (BENICAR ) 20 MG tablet TAKE 1 TABLET BY MOUTH EVERY DAY 90 tablet 2   omeprazole  (PRILOSEC) 10 MG capsule TAKE 1 CAPSULE (10 MG TOTAL) BY MOUTH DAILY AS NEEDED. 90 capsule 1   oxyCODONE  (OXY IR/ROXICODONE ) 5 MG immediate release tablet Take 1 tablet (5 mg total) by mouth every 6 (six) hours as needed for severe pain. 12 tablet 0   rosuvastatin  (CRESTOR ) 40 MG tablet Take 1  tablet (40 mg total) by mouth daily. NEED APPOINTMENT 90 tablet 3   vitamin B-12 (CYANOCOBALAMIN) 100 MCG tablet Take 100 mcg by mouth daily.     Vitamin E 400 units TABS Take 400 Units by mouth daily.     Tiotropium Bromide Monohydrate  (SPIRIVA  RESPIMAT) 2.5 MCG/ACT AERS Inhale 2 puffs into the lungs daily. 1 g 5   No facility-administered medications prior to visit.   Review of Systems  Constitutional:  Negative for chills, fever, malaise/fatigue and weight loss.  HENT:  Negative for congestion, sinus pain and sore throat.   Respiratory:  Negative for cough, hemoptysis, sputum production, shortness of breath and wheezing.   Cardiovascular:  Negative for chest pain, palpitations, orthopnea, claudication and leg swelling.  Gastrointestinal:  Negative for abdominal pain, heartburn, nausea and vomiting.  Genitourinary: Negative.   Musculoskeletal:  Negative for joint pain and myalgias.  Skin:  Negative for rash.  Neurological:  Negative for weakness.    Objective:   Vitals:   11/12/23 1506  BP: (!) 142/72  Pulse: 77  SpO2: 98%  Weight: 106 lb (48.1 kg)  Height: 5\' 7"  (1.702 m)    Physical Exam Constitutional:      General: She is not in acute distress.    Appearance: She is not ill-appearing.  HENT:     Head: Normocephalic and atraumatic.  Eyes:     Conjunctiva/sclera: Conjunctivae normal.  Cardiovascular:     Rate and Rhythm: Normal rate and regular rhythm.      Pulses: Normal pulses.     Heart sounds: Normal heart sounds. No murmur heard. Pulmonary:     Effort: Pulmonary effort is normal.     Breath sounds: Normal breath sounds. No wheezing, rhonchi or rales.  Musculoskeletal:     Right lower leg: No edema.     Left lower leg: No edema.  Skin:    General: Skin is warm and dry.  Neurological:     General: No focal deficit present.     Mental Status: She is alert.    CBC    Component Value Date/Time   WBC 7.8 11/13/2022 1637   WBC 7.3 08/27/2022 0935   RBC 5.02 11/13/2022 1637   RBC 4.56 08/27/2022 0935   HGB 15.7 11/13/2022 1637   HCT 45.0 11/13/2022 1637   PLT 285 11/13/2022 1637   MCV 90 11/13/2022 1637   MCH 31.3 11/13/2022 1637   MCH 30.9 08/27/2022 0935   MCHC 34.9 11/13/2022 1637   MCHC 32.2 08/27/2022 0935   RDW 13.4 11/13/2022 1637   LYMPHSABS 1,809 04/10/2020 1447   MONOABS 0.6 07/23/2017 0737   EOSABS 84 04/10/2020 1447   BASOSABS 68 04/10/2020 1447      Latest Ref Rng & Units 11/21/2022    6:52 AM 11/13/2022    4:37 PM 08/27/2022    9:35 AM  BMP  Glucose 70 - 99 mg/dL 161  85  096   BUN 8 - 23 mg/dL 29  22  19    Creatinine 0.44 - 1.00 mg/dL 0.45  4.09  8.11   BUN/Creat Ratio 12 - 28  22    Sodium 135 - 145 mmol/L 140  140  140   Potassium 3.5 - 5.1 mmol/L 4.6  5.9  4.5   Chloride 98 - 111 mmol/L 107  105  106   CO2 22 - 32 mmol/L 23  21  27    Calcium  8.9 - 10.3 mg/dL 9.8  91.4  9.6  Chest imaging: CXR 02/02/21 The heart size and mediastinal contours are within normal limits. Aortic atherosclerotic calcification noted. Prior median sternotomy noted.   Pulmonary hyperinflation again seen, consistent with COPD. Both lungs are clear. Surgical clips again noted in the left upper quadrant. The visualized skeletal structures are unremarkable.  PFT:    Latest Ref Rng & Units 05/02/2022   10:09 AM  PFT Results  FVC-Pre L 1.79   FVC-Predicted Pre % 55   FVC-Post L 2.09   FVC-Predicted Post % 64   Pre  FEV1/FVC % % 54   Post FEV1/FCV % % 44   FEV1-Pre L 0.97   FEV1-Predicted Pre % 39   FEV1-Post L 0.92   DLCO uncorrected ml/min/mmHg 12.12   DLCO UNC% % 58   DLCO corrected ml/min/mmHg 12.12   DLCO COR %Predicted % 58   DLVA Predicted % 83   TLC L 5.68   TLC % Predicted % 106   RV % Predicted % 161   PFT 2034: severe obstruction, air trapping, moderate diffusion defect  Labs:  Path:  Echo:  Heart Catheterization:  Assessment & Plan:   Chronic obstructive pulmonary disease, unspecified COPD type (HCC) - Plan: Fluticasone-Umeclidin-Vilant (TRELEGY ELLIPTA ) 200-62.5-25 MCG/ACT AEPB, Pulse oximetry, overnight  Cigarette smoker  PVD (peripheral vascular disease) (HCC)  Discussion: [name] is a 73 year old woman, daily smoker with coronary artery disease, hypertension and peripheral vascular disease who returns to pulmonary clinic for COPD.   COPD Cigarette Smoking - start trelegy ellipta  1 puff daily - PRN albuterol  - Follow up with lung cancer screening - no oxygen desaturations on simple walk today - schedule overnight oxygen test on room air  Follow up in 3 months  Duaine German, MD [facility] Office: [phone]   Current Outpatient Medications:    albuterol  (VENTOLIN  HFA) 108 (90 Base) MCG/ACT inhaler, Inhale 1-2 puffs into the lungs every 6 (six) hours as needed for wheezing or shortness of breath., Disp: 18 g, Rfl: 2   ALPRAZolam  (XANAX ) 0.25 MG tablet, TAKE 1 TABLET BY MOUTH EVERY DAY (Patient taking differently: Take 0.25 mg by mouth daily as needed for anxiety.), Disp: 30 tablet, Rfl: 0   amLODipine  (NORVASC ) 10 MG tablet, Take 1 tablet (10 mg total) by mouth daily., Disp: 90 tablet, Rfl: 3   aspirin  81 MG tablet, Take 81 mg by mouth daily., Disp: , Rfl:    calcipotriene (DOVONOX) 0.005 % cream, Apply 1 Application topically daily as needed (top of foot)., Disp: , Rfl:    carvedilol  (COREG ) 3.125 MG tablet, Take 1 tablet  (3.125 mg total) by mouth 2 (two) times daily with a meal., Disp: 180 tablet, Rfl: 3   cilostazol  (PLETAL ) 50 MG tablet, TAKE 1 TABLET(50 MG) BY MOUTH TWICE DAILY, Disp: 180 tablet, Rfl: 3   desonide (DESOWEN) 0.05 % cream, Apply 1 Application topically daily as needed (head)., Disp: , Rfl:    diphenhydrAMINE (BENADRYL) 25 mg capsule, Take 25 mg by mouth every 6 (six) hours as needed., Disp: , Rfl:    ezetimibe  (ZETIA ) 10 MG tablet, Take 1 tablet (10 mg total) by mouth daily. For cholesterol, Disp: 90 tablet, Rfl: 3   fexofenadine  (ALLEGRA ) 180 MG tablet, Take 1 tablet (180 mg total) by mouth daily., Disp: 30 tablet, Rfl: 5   Fluticasone-Umeclidin-Vilant (TRELEGY ELLIPTA ) 200-62.5-25 MCG/ACT AEPB, Inhale 1 puff into the lungs daily., Disp: 28 each, Rfl: 11   montelukast  (SINGULAIR ) 10 MG tablet, TAKE 1 TABLET BY  MOUTH EVERYDAY AT BEDTIME, Disp: 90 tablet, Rfl: 3   Multiple Vitamins-Minerals (PRESERVISION AREDS 2) CAPS, Take 1 capsule by mouth daily., Disp: , Rfl:    nitroGLYCERIN  (NITROSTAT ) 0.4 MG SL tablet, PLACE 1 TABLET ON THE TONGUE &LET DISSOLVE EVERY 5 MINS AS NEEDED FOR CHEST PAIN, Disp: 25 tablet, Rfl: 1   olmesartan  (BENICAR ) 20 MG tablet, TAKE 1 TABLET BY MOUTH EVERY DAY, Disp: 90 tablet, Rfl: 2   omeprazole  (PRILOSEC) 10 MG capsule, TAKE 1 CAPSULE (10 MG TOTAL) BY MOUTH DAILY AS NEEDED., Disp: 90 capsule, Rfl: 1   oxyCODONE  (OXY IR/ROXICODONE ) 5 MG immediate release tablet, Take 1 tablet (5 mg total) by mouth every 6 (six) hours as needed for severe pain., Disp: 12 tablet, Rfl: 0   rosuvastatin  (CRESTOR ) 40 MG tablet, Take 1 tablet (40 mg total) by mouth daily. NEED APPOINTMENT, Disp: 90 tablet, Rfl: 3   vitamin B-12 (CYANOCOBALAMIN) 100 MCG tablet, Take 100 mcg by mouth daily., Disp: , Rfl:    Vitamin E 400 units TABS, Take 400 Units by mouth daily., Disp: , Rfl:

## 2023-11-12 NOTE — Patient Instructions (Addendum)
 We will put you back on Trelegy 1 puff daily, stop spiriva  - rinse your mouth out after each use  We will check your oxygen levels while walking today  We will schedule you for overnight oxygen test   Follow up in 3 months, call sooner if needed

## 2023-11-13 LAB — HM DEXA SCAN: HM Dexa Scan: UNDETERMINED

## 2023-11-14 NOTE — Telephone Encounter (Signed)
 Seen in office 5/28

## 2023-11-19 ENCOUNTER — Encounter: Payer: Self-pay | Admitting: Pulmonary Disease

## 2023-12-01 ENCOUNTER — Telehealth: Payer: Self-pay

## 2023-12-01 DIAGNOSIS — J449 Chronic obstructive pulmonary disease, unspecified: Secondary | ICD-10-CM

## 2023-12-01 MED ORDER — TRELEGY ELLIPTA 200-62.5-25 MCG/ACT IN AEPB: 1.0000 | INHALATION_SPRAY | Freq: Every day | RESPIRATORY_TRACT | 11 refills | Status: AC

## 2023-12-01 NOTE — Telephone Encounter (Signed)
 Copied from CRM [id number]. Topic: Clinical - Prescription Issue >> Nov 28, 2023  3:17 PM Tyronne Galloway wrote: Reason for CRM: Pt would like the Fluticasone-Umeclidin-Vilant (TRELEGY ELLIPTA ) 200-62.5-25 MCG/ACT AEPB to [facility] and cancelled for [facility] pharmacy. Please send the [facility] to [id number] Jonette Nestle, [place] - [address] [place] [address] Phone: [phone] Fax: [phone] Hours: Not open 24 hours. Pt's best phone number is [phone] ok to leave a vm.  Med sent to pts preferred pharmacy. Nfn

## 2023-12-12 ENCOUNTER — Telehealth: Payer: Self-pay | Admitting: Pulmonary Disease

## 2023-12-12 DIAGNOSIS — J449 Chronic obstructive pulmonary disease, unspecified: Secondary | ICD-10-CM

## 2023-12-12 NOTE — Telephone Encounter (Signed)
 ONO Results:  4hr 38 seconds with SpO2 88% or less. She does qualify for night time oxygen. Recommend she use 3L of supplemental oxygen. Please place orders for O2 if she is ok with starting therapy.  JD

## 2023-12-12 NOTE — Telephone Encounter (Signed)
 Pt verbalized understanding placed O2 order

## 2023-12-24 ENCOUNTER — Encounter: Payer: Self-pay | Admitting: Pulmonary Disease

## 2023-12-30 NOTE — H&P (Signed)
 Patient's anticipated LOS is less than 2 midnights, meeting these requirements: - Younger than 45 - Lives within 1 hour of care - Has a competent adult at home to recover with post-op recover - NO history of  - Chronic pain requiring opiods  - Diabetes  - Coronary Artery Disease  - Heart failure  - Heart attack  - Stroke  - DVT/VTE  - Cardiac arrhythmia  - Respiratory Failure/COPD  - Renal failure  - Anemia  - Advanced Liver disease     [name] is an 73 y.o. female.    Chief Complaint: left shoulder pain  HPI: Pt is a 73 y.o. female complaining of left shoulder pain pain for multiple years. Pain had continually increased since the beginning. X-rays in the clinic show end-stage arthritic changes of the left shoulder. Pt has tried various conservative treatments which have failed to alleviate their symptoms, including injections and therapy. Various options are discussed with the patient. Risks, benefits and expectations were discussed with the patient. Patient understand the risks, benefits and expectations and wishes to proceed with surgery.   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, jaw (HCC)    Peripheral vascular disease (HCC)    carotid to subclavian artery bypass graft   Smoker     PSH: Past Surgical History:  Procedure Laterality Date   ABDOMINAL AORTOGRAM W/LOWER EXTREMITY N/A 11/21/2022   Procedure: ABDOMINAL AORTOGRAM W/LOWER EXTREMITY;  Surgeon: Court Dorn PARAS, MD;  Location:  [facility] INVASIVE CV LAB;  Service: Cardiovascular;  Laterality: N/A;   CAROTID ARTERY - SUBCLAVIAN ARTERY BYPASS GRAFT Left 04/18/2007   COLONOSCOPY  2008   CORONARY ARTERY BYPASS GRAFT     CORONARY/GRAFT ACUTE MI REVASCULARIZATION N/A 05/12/2017   Procedure: Coronary/Graft Acute MI Revascularization;  Surgeon: Wonda Sharper, MD;  [facility]: [facility] INVASIVE CV LAB;  Service: Cardiovascular;  Laterality: N/A;   DEBRIDEMENT MANDIBLE Left 03/23/2019   Procedure: DEBRIDEMENT LEFT MANDIBLE,SEQUESTRECTOMY;  Surgeon: Joanette Soulier, DMD;  Location: [facility] OR;  Service: Oral Surgery;  Laterality: Left;   EYE SURGERY Bilateral 2020   cataract removal   KIDNEY SURGERY Left    73 yo   LEFT HEART CATH AND CORONARY ANGIOGRAPHY N/A 05/12/2017   Procedure: LEFT HEART CATH AND CORONARY ANGIOGRAPHY;  Surgeon: Wonda Sharper, MD;  Location: [facility] INVASIVE CV LAB;  Service: Cardiovascular;  Laterality: N/A;   Left Nephrectomy Left 10/17/1983   Dr. Sural---StagHornCalculus, Left Kidney Nonfunctioning wiht Acute and Chronic Pyelonephritis   WISDOM TOOTH EXTRACTION     XI ROBOTIC ASSISTED INGUINAL HERNIA REPAIR WITH MESH Left 08/30/2022   Procedure: ROBOTIC LEFT INGUINAL HERNIA REPAIR WITH MESH;  Surgeon: Kinsinger, Herlene Righter, MD;  Location: [facility] ORS;  Service: General;  Laterality: Left;    Social History:  reports that she has been smoking cigarettes. She has a 20 pack-year smoking history. She has never used smokeless tobacco. She reports current alcohol use. She reports that she does not use drugs.  BMI: There is no height or weight on file to calculate BMI.  Lab Results  Component Value Date   ALBUMIN 3.3 (L) 07/23/2017   Diabetes: Patient does not have a diagnosis of diabetes.     Smoking Status: Social History   Tobacco Use  Smoking Status Every Day   Current packs/day: 0.50   Average packs/day: 0.5 packs/day for 40.0 years (20.0 ttl pk-yrs)   Types: Cigarettes  Smokeless Tobacco Never   Ready to quit: Not  Answered Counseling given: Not Answered  The patient has participated in a 4-week cessation program.          Allergies:  Allergies  Allergen Reactions   Bisphosphonates Other (See Comments)    Osteonecrosis Jaw     Medications: No current facility-administered medications for this encounter.   Current Outpatient Medications  Medication Sig Dispense Refill   albuterol  (VENTOLIN  HFA) 108 (90 Base) MCG/ACT inhaler Inhale 1-2 puffs into the lungs every 6 (six) hours as needed for wheezing or shortness of breath. 18 g 2   ALPRAZolam  (XANAX ) 0.25 MG tablet TAKE 1 TABLET BY MOUTH EVERY DAY (Patient taking differently: Take 0.25 mg by mouth daily as needed for anxiety.) 30 tablet 0   amLODipine  (NORVASC ) 10 MG tablet Take 1 tablet (10 mg total) by mouth daily. 90 tablet 3   aspirin  81 MG tablet Take 81 mg by mouth daily.     calcipotriene (DOVONOX) 0.005 % cream Apply 1 Application topically daily as needed (top of foot).     carvedilol  (COREG ) 3.125 MG tablet Take 1 tablet (3.125 mg total) by mouth 2 (two) times daily with a meal. 180 tablet 3   cilostazol  (PLETAL ) 50 MG tablet TAKE 1 TABLET(50 MG) BY MOUTH TWICE DAILY 180 tablet 3   desonide (DESOWEN) 0.05 % cream Apply 1 Application topically daily as needed (head).     diphenhydrAMINE (BENADRYL) 25 mg capsule Take 25 mg by mouth every 6 (six) hours as needed.     ezetimibe  (ZETIA ) 10 MG tablet Take 1 tablet (10 mg total) by mouth daily. For cholesterol 90 tablet 3   fexofenadine  (ALLEGRA ) 180 MG tablet Take 1 tablet (180 mg total) by mouth daily. 30 tablet 5   Fluticasone-Umeclidin-Vilant (TRELEGY ELLIPTA ) 200-62.5-25 MCG/ACT AEPB Inhale 1 puff into the lungs daily. 28 each 11   montelukast  (SINGULAIR ) 10 MG tablet TAKE 1 TABLET BY MOUTH EVERYDAY AT BEDTIME 90 tablet 3   Multiple Vitamins-Minerals (PRESERVISION AREDS 2) CAPS Take 1 capsule by mouth daily.     nitroGLYCERIN  (NITROSTAT ) 0.4 MG SL tablet PLACE 1 TABLET ON THE TONGUE  &LET DISSOLVE EVERY 5 MINS AS NEEDED FOR CHEST PAIN 25 tablet 1   olmesartan  (BENICAR ) 20 MG tablet TAKE 1 TABLET BY MOUTH EVERY DAY 90 tablet 2   omeprazole  (PRILOSEC) 10 MG capsule TAKE 1 CAPSULE (10 MG TOTAL) BY MOUTH DAILY AS NEEDED. 90 capsule 1   oxyCODONE  (OXY IR/ROXICODONE ) 5 MG immediate release tablet Take 1 tablet (5 mg total) by mouth every 6 (six) hours as needed for severe pain. 12 tablet 0   rosuvastatin  (CRESTOR ) 40 MG tablet Take 1 tablet (40 mg total) by mouth daily. NEED APPOINTMENT 90 tablet 3   vitamin B-12 (CYANOCOBALAMIN) 100 MCG tablet Take 100 mcg by mouth daily.     Vitamin E 400 units TABS Take 400 Units by mouth daily.      No results found for this or any previous visit (from the past 48 hours). No results  found.  ROS: Pain with rom of the left upper extremity  Physical Exam: Alert and oriented 73 y.o. female in no acute distress Cranial nerves 2-12 intact Cervical spine: full rom with no tenderness, nv intact distally Chest: active breath sounds bilaterally, no wheeze rhonchi or rales Heart: regular rate and rhythm, no murmur Abd: non tender non distended with active bowel sounds Hip is stable with rom  Left shoulder painful rom with weakness  Nv intact distally  Assessment/Plan Assessment: left shoulder cuff arthropathy  Plan:  Patient will undergo a left reverse total shoulder by Dr. Kay at [place] Risks benefits and expectations were discussed with the patient. Patient understand risks, benefits and expectations and wishes to proceed. Preoperative templating of the joint replacement has been completed, documented, and submitted to the Operating Room personnel in order to optimize intra-operative equipment management.   Arvella Fireman PA-C, MPAS [facility] is now [employer] [address], [address], [place], [place] [address] Phone: [phone] [email or web address] Facebook  [employer]

## 2024-01-07 NOTE — Patient Instructions (Signed)
 SURGICAL WAITING ROOM VISITATION  Patients having surgery or a procedure may have no more than 2 support people in the waiting area - these visitors may rotate.    Children under the age of 43 must have an adult with them who is not the patient.  Visitors with respiratory illnesses are discouraged from visiting and should remain at home.  If the patient needs to stay at the hospital during part of their recovery, the visitor guidelines for inpatient rooms apply. Pre-op nurse will coordinate an appropriate time for 1 support person to accompany patient in pre-op.  This support person may not rotate.    Please refer to the [facility] website for the visitor guidelines for Inpatients (after your surgery is over and you are in a regular room).       Your procedure is scheduled on: 01/16/24   Report to [facility] Main Entrance    Report to admitting at: 5:15 AM   Call this number if you have problems the morning of surgery [phone]   Do not eat food :After Midnight.   After Midnight you may have the following liquids until : 4:30 AM DAY OF SURGERY  Water Non-Citrus Juices (without pulp, NO RED-Apple, White grape, White cranberry) Black Coffee (NO MILK/CREAM OR CREAMERS, sugar ok)  Clear Tea (NO MILK/CREAM OR CREAMERS, sugar ok) regular and decaf                             Plain Jell-O (NO RED)                                           Fruit ices (not with fruit pulp, NO RED)                                     Popsicles (NO RED)                                                               Sports drinks like Gatorade (NO RED)   The day of surgery:  Drink ONE (1) Pre-Surgery Clear Ensure at : 4:30 AM the morning of surgery. Drink in one sitting. Do not sip.  This drink was given to you during your hospital  pre-op appointment visit. Nothing else to drink after completing the  Pre-Surgery Clear Ensure or G2.          If you have questions, please contact your  surgeon's office.  FOLLOW ANY ADDITIONAL PRE OP INSTRUCTIONS YOU RECEIVED FROM YOUR SURGEON'S OFFICE!!!   Oral Hygiene is also important to reduce your risk of infection.                                    Remember - BRUSH YOUR TEETH THE MORNING OF SURGERY WITH YOUR REGULAR TOOTHPASTE  DENTURES WILL BE REMOVED PRIOR TO SURGERY PLEASE DO NOT APPLY Poly grip OR ADHESIVES!!!   Do NOT smoke after Midnight   Stop all vitamins  and herbal supplements 7 days before surgery.   Take these medicines the morning of surgery with A SIP OF WATER: fexofenadine ,carvedilol ,amlodipine ,omeprazole .Alprazolam  as needed.Use inhalers as usual.                              You may not have any metal on your body including hair pins, jewelry, and body piercing             Do not wear make-up, lotions, powders, perfumes/cologne, or deodorant  Do not wear nail polish including gel and S&S, artificial/acrylic nails, or any other type of covering on natural nails including finger and toenails. If you have artificial nails, gel coating, etc. that needs to be removed by a nail salon please have this removed prior to surgery or surgery may need to be canceled/ delayed if the surgeon/ anesthesia feels like they are unable to be safely monitored.   Do not shave  48 hours prior to surgery.    Do not bring valuables to the hospital. [facility] IS NOT             RESPONSIBLE   FOR VALUABLES.   Contacts, glasses, dentures or bridgework may not be worn into surgery.   Bring small overnight bag day of surgery.   DO NOT BRING YOUR HOME MEDICATIONS TO THE HOSPITAL. PHARMACY WILL DISPENSE MEDICATIONS LISTED ON YOUR MEDICATION LIST TO YOU DURING YOUR ADMISSION IN THE HOSPITAL!    Patients discharged on the day of surgery will not be allowed to drive home.  Someone NEEDS to stay with you for the first 24 hours after anesthesia.   Special Instructions: Bring a copy of your healthcare power of attorney and living will  documents the day of surgery if you haven't scanned them before.              Please read over the following fact sheets you were given: IF YOU HAVE QUESTIONS ABOUT YOUR PRE-OP INSTRUCTIONS PLEASE CALL [phone].   If you received a COVID test during your pre-op visit  it is requested that you wear a mask when out in public, stay away from anyone that may not be feeling well and notify your surgeon if you develop symptoms. If you test positive for Covid or have been in contact with anyone that has tested positive in the last 10 days please notify you surgeon.    [facility]- Preparing for Total Shoulder Arthroplasty    Before surgery, you can play an important role. Because skin is not sterile, your skin needs to be as free of germs as possible. You can reduce the number of germs on your skin by using the following products. Benzoyl Peroxide Gel Reduces the number of germs present on the skin Applied twice a day to shoulder area starting two days before surgery    ==================================================================  Please follow these instructions carefully:  BENZOYL PEROXIDE 5% GEL  Please do not use if you have an allergy to benzoyl peroxide.   If your skin becomes reddened/irritated stop using the benzoyl peroxide.  Starting two days before surgery, apply as follows: Apply benzoyl peroxide in the morning and at night. Apply after taking a shower. If you are not taking a shower clean entire shoulder front, back, and side along with the armpit with a clean wet washcloth.  Place a quarter-sized dollop on your shoulder and rub in thoroughly, making sure to cover the front, back, and side  of your shoulder, along with the armpit.   2 days before ____ AM   ____ PM              1 day before ____ AM   ____ PM                         Do this twice a day for two days.  (Last application is the night before surgery, AFTER using the CHG soap as described below).  Do NOT apply  benzoyl peroxide gel on the day of surgery.  Pre-operative 5 CHG Bath Instructions   You can play a key role in reducing the risk of infection after surgery. Your skin needs to be as free of germs as possible. You can reduce the number of germs on your skin by washing with CHG (chlorhexidine  gluconate) soap before surgery. CHG is an antiseptic soap that kills germs and continues to kill germs even after washing.   DO NOT use if you have an allergy to chlorhexidine /CHG or antibacterial soaps. If your skin becomes reddened or irritated, stop using the CHG and notify one of our RNs at [phone].   Please shower with the CHG soap starting 4 days before surgery using the following schedule:     Please keep in mind the following:  DO NOT shave, including legs and underarms, starting the day of your first shower.   You may shave your face at any point before/day of surgery.  Place clean sheets on your bed the day you start using CHG soap. Use a clean washcloth (not used since being washed) for each shower. DO NOT sleep with pets once you start using the CHG.   CHG Shower Instructions:  If you choose to wash your hair and private area, wash first with your normal shampoo/soap.  After you use shampoo/soap, rinse your hair and body thoroughly to remove shampoo/soap residue.  Turn the water OFF and apply about 3 tablespoons (45 ml) of CHG soap to a CLEAN washcloth.  Apply CHG soap ONLY FROM YOUR NECK DOWN TO YOUR TOES (washing for 3-5 minutes)  DO NOT use CHG soap on face, private areas, open wounds, or sores.  Pay special attention to the area where your surgery is being performed.  If you are having back surgery, having someone wash your back for you may be helpful. Wait 2 minutes after CHG soap is applied, then you may rinse off the CHG soap.  Pat dry with a clean towel  Put on clean clothes/pajamas   If you choose to wear lotion, please use ONLY the CHG-compatible lotions on the back of  this paper.     Additional instructions for the day of surgery: DO NOT APPLY any lotions, deodorants, cologne, or perfumes.   Put on clean/comfortable clothes.  Brush your teeth.  Ask your nurse before applying any prescription medications to the skin.   CHG Compatible Lotions   Aveeno Moisturizing lotion  Cetaphil Moisturizing Cream  Cetaphil Moisturizing Lotion  Clairol Herbal Essence Moisturizing Lotion, Dry Skin  Clairol Herbal Essence Moisturizing Lotion, Extra Dry Skin  Clairol Herbal Essence Moisturizing Lotion, Normal Skin  Curel Age Defying Therapeutic Moisturizing Lotion with Alpha Hydroxy  Curel Extreme Care Body Lotion  Curel Soothing Hands Moisturizing Hand Lotion  Curel Therapeutic Moisturizing Cream, Fragrance-Free  Curel Therapeutic Moisturizing Lotion, Fragrance-Free  Curel Therapeutic Moisturizing Lotion, Original Formula  Eucerin Daily Replenishing Lotion  Eucerin Dry Skin Therapy Plus Alpha Hydroxy  Crme  Eucerin Dry Skin Therapy Plus Alpha Hydroxy Lotion  Eucerin Original Crme  Eucerin Original Lotion  Eucerin Plus Crme Eucerin Plus Lotion  Eucerin TriLipid Replenishing Lotion  Keri Anti-Bacterial Hand Lotion  Keri Deep Conditioning Original Lotion Dry Skin Formula Softly Scented  Keri Deep Conditioning Original Lotion, Fragrance Free Sensitive Skin Formula  Keri Lotion Fast Absorbing Fragrance Free Sensitive Skin Formula  Keri Lotion Fast Absorbing Softly Scented Dry Skin Formula  Keri Original Lotion  Keri Skin Renewal Lotion Keri Silky Smooth Lotion  Keri Silky Smooth Sensitive Skin Lotion  Nivea Body Creamy Conditioning Oil  Nivea Body Extra Enriched Lotion  Nivea Body Original Lotion  Nivea Body Sheer Moisturizing Lotion Nivea Crme  Nivea Skin Firming Lotion  NutraDerm 30 Skin Lotion  NutraDerm Skin Lotion  NutraDerm Therapeutic Skin Cream  NutraDerm Therapeutic Skin Lotion  ProShield Protective Hand Cream  Provon moisturizing lotion    Incentive Spirometer  An incentive spirometer is a tool that can help keep your lungs clear and active. This tool measures how well you are filling your lungs with each breath. Taking long deep breaths may help reverse or decrease the chance of developing breathing (pulmonary) problems (especially infection) following: A long period of time when you are unable to move or be active. BEFORE THE PROCEDURE  If the spirometer includes an indicator to show your best effort, your nurse or respiratory therapist will set it to a desired goal. If possible, sit up straight or lean slightly forward. Try not to slouch. Hold the incentive spirometer in an upright position. INSTRUCTIONS FOR USE  Sit on the edge of your bed if possible, or sit up as far as you can in bed or on a chair. Hold the incentive spirometer in an upright position. Breathe out normally. Place the mouthpiece in your mouth and seal your lips tightly around it. Breathe in slowly and as deeply as possible, raising the piston or the ball toward the top of the column. Hold your breath for 3-5 seconds or for as long as possible. Allow the piston or ball to fall to the bottom of the column. Remove the mouthpiece from your mouth and breathe out normally. Rest for a few seconds and repeat Steps 1 through 7 at least 10 times every 1-2 hours when you are awake. Take your time and take a few normal breaths between deep breaths. The spirometer may include an indicator to show your best effort. Use the indicator as a goal to work toward during each repetition. After each set of 10 deep breaths, practice coughing to be sure your lungs are clear. If you have an incision (the cut made at the time of surgery), support your incision when coughing by placing a pillow or rolled up towels firmly against it. Once you are able to get out of bed, walk around indoors and cough well. You may stop using the incentive spirometer when instructed by your caregiver.   RISKS AND COMPLICATIONS Take your time so you do not get dizzy or light-headed. If you are in pain, you may need to take or ask for pain medication before doing incentive spirometry. It is harder to take a deep breath if you are having pain. AFTER USE Rest and breathe slowly and easily. It can be helpful to keep track of a log of your progress. Your caregiver can provide you with a simple table to help with this. If you are using the spirometer at home, follow these instructions: [facility]  CARE IF:  You are having difficultly using the spirometer. You have trouble using the spirometer as often as instructed. Your pain medication is not giving enough relief while using the spirometer. You develop fever of 100.5 F (38.1 C) or higher. SEEK IMMEDIATE MEDICAL CARE IF:  You cough up bloody sputum that had not been present before. You develop fever of 102 F (38.9 C) or greater. You develop worsening pain at or near the incision site. MAKE SURE YOU:  Understand these instructions. Will watch your condition. Will get help right away if you are not doing well or get worse. Document Released: 10/14/2006 Document Revised: 08/26/2011 Document Reviewed: 12/15/2006 [facility] Patient Information 2014 [place], [place].   ________________________________________________________________________

## 2024-01-08 ENCOUNTER — Encounter (HOSPITAL_COMMUNITY): Payer: Self-pay

## 2024-01-08 ENCOUNTER — Encounter (HOSPITAL_COMMUNITY)
Admission: RE | Admit: 2024-01-08 | Discharge: 2024-01-08 | Disposition: A | Discharge status: Home / Self Care | Source: Ambulatory Visit | Attending: Orthopedic Surgery | Admitting: Orthopedic Surgery

## 2024-01-08 ENCOUNTER — Other Ambulatory Visit: Payer: Self-pay

## 2024-01-08 VITALS — BP 156/81 | HR 86 | Temp 97.9°F | Ht 67.0 in | Wt 98.0 lb

## 2024-01-08 DIAGNOSIS — Z951 Presence of aortocoronary bypass graft: Secondary | ICD-10-CM | POA: Insufficient documentation

## 2024-01-08 DIAGNOSIS — S42252A Displaced fracture of greater tuberosity of left humerus, initial encounter for closed fracture: Secondary | ICD-10-CM | POA: Insufficient documentation

## 2024-01-08 DIAGNOSIS — I255 Ischemic cardiomyopathy: Secondary | ICD-10-CM | POA: Insufficient documentation

## 2024-01-08 DIAGNOSIS — X58XXXA Exposure to other specified factors, initial encounter: Secondary | ICD-10-CM | POA: Insufficient documentation

## 2024-01-08 DIAGNOSIS — Z01818 Encounter for other preprocedural examination: Secondary | ICD-10-CM

## 2024-01-08 DIAGNOSIS — J449 Chronic obstructive pulmonary disease, unspecified: Secondary | ICD-10-CM | POA: Insufficient documentation

## 2024-01-08 DIAGNOSIS — I2510 Atherosclerotic heart disease of native coronary artery without angina pectoris: Secondary | ICD-10-CM | POA: Insufficient documentation

## 2024-01-08 DIAGNOSIS — I10 Essential (primary) hypertension: Secondary | ICD-10-CM | POA: Insufficient documentation

## 2024-01-08 DIAGNOSIS — Z01812 Encounter for preprocedural laboratory examination: Secondary | ICD-10-CM | POA: Diagnosis present

## 2024-01-08 DIAGNOSIS — F17290 Nicotine dependence, other tobacco product, uncomplicated: Secondary | ICD-10-CM | POA: Insufficient documentation

## 2024-01-08 DIAGNOSIS — I739 Peripheral vascular disease, unspecified: Secondary | ICD-10-CM | POA: Diagnosis not present

## 2024-01-08 HISTORY — DX: Cardiac arrhythmia, unspecified: I49.9

## 2024-01-08 HISTORY — DX: Pneumonia, unspecified organism: J18.9

## 2024-01-08 LAB — CBC
HCT: 47.6 % — ABNORMAL HIGH (ref 36.0–46.0)
Hemoglobin: 15 g/dL (ref 12.0–15.0)
MCH: 30.2 pg (ref 26.0–34.0)
MCHC: 31.5 g/dL (ref 30.0–36.0)
MCV: 96 fL (ref 80.0–100.0)
Platelets: 253 K/uL (ref 150–400)
RBC: 4.96 MIL/uL (ref 3.87–5.11)
RDW: 16.4 % — ABNORMAL HIGH (ref 11.5–15.5)
WBC: 7.9 K/uL (ref 4.0–10.5)
nRBC: 0 % (ref 0.0–0.2)

## 2024-01-08 LAB — BASIC METABOLIC PANEL WITH GFR
Anion gap: 10 (ref 5–15)
BUN: 21 mg/dL (ref 8–23)
CO2: 25 mmol/L (ref 22–32)
Calcium: 10.6 mg/dL — ABNORMAL HIGH (ref 8.9–10.3)
Chloride: 108 mmol/L (ref 98–111)
Creatinine, Ser: 1.12 mg/dL — ABNORMAL HIGH (ref 0.44–1.00)
GFR, Estimated: 52 mL/min — ABNORMAL LOW (ref >60)
Glucose, Bld: 94 mg/dL (ref 70–99)
Potassium: 4.6 mmol/L (ref 3.5–5.1)
Sodium: 143 mmol/L (ref 135–145)

## 2024-01-08 LAB — SURGICAL PCR SCREEN
MRSA, PCR: NEGATIVE
Staphylococcus aureus: NEGATIVE

## 2024-01-08 NOTE — Progress Notes (Signed)
 For Anesthesia: PCP - Stephane Leita DEL, MD  Cardiologist - Lavona Agent, MD  Clearance:Cleaver, Josefa HERO, NP : 09/24/23 Pulmonologist:Dewald, Dorn NOVAK, MD LOV: 12/13/23 Bowel Prep reminder:  Chest x-ray - CT Chest: 07/09/23 EKG - 04/21/23 Stress Test -  ECHO - 10/08/22 Cardiac Cath - 05/12/17 Pacemaker/ICD device last checked: Pacemaker orders received: Device Rep notified:  Spinal Cord Stimulator:N/A  Sleep Study - N/A CPAP -   Fasting Blood Sugar - N/A Checks Blood Sugar _____ times a day Date and result of last Hgb A1c-  Last dose of GLP1 agonist- N/A GLP1 instructions:   Last dose of SGLT-2 inhibitors- N/A SGLT-2 instructions:   Blood Thinner Instructions: Pt. Takes cilostazol  and aspirin ,not instructions yet for these medicines. Aspirin  Instructions: Last Dose:  Activity level: Can go up a flight of stairs and activities of daily living without stopping and without chest pain and/or shortness of breath   Able to exercise without chest pain and/or shortness of breath  Anesthesia review: Hx: CAD,COPD,HTN,MI,Smoker  Patient denies shortness of breath, fever, cough and chest pain at PAT appointment   Patient verbalized understanding of instructions that were reviewed over the telephone.

## 2024-01-09 NOTE — Anesthesia Preprocedure Evaluation (Addendum)
 Anesthesia Evaluation  Patient identified by MRN, date of birth, ID band Patient awake    Reviewed: Allergy & Precautions, NPO status , Patient's Chart, lab work & pertinent test results, reviewed documented beta blocker date and time   Airway Mallampati: II  TM Distance: >3 FB Neck ROM: Full    Dental  (+) Dental Advisory Given, Edentulous Upper, Edentulous Lower   Pulmonary COPD,  oxygen dependent, Current Smoker and Patient abstained from smoking.   Pulmonary exam normal breath sounds clear to auscultation       Cardiovascular hypertension, Pt. on medications and Pt. on home beta blockers + CAD, + Past MI, + Cardiac Stents, + CABG and + Peripheral Vascular Disease (carotid to subclavian artery bypass graft)  Normal cardiovascular exam Rhythm:Regular Rate:Normal  Echo 10/08/22: 1. Left ventricular ejection fraction, by estimation, is 60 to 65%. The  left ventricle has normal function. The left ventricle has no regional  wall motion abnormalities. There is mild left ventricular hypertrophy.  Left ventricular diastolic parameters  are consistent with Grade I diastolic dysfunction (impaired relaxation).  The average left ventricular global longitudinal strain is -16.9 %. The  global longitudinal strain is normal.   2. Right ventricular systolic function is normal. The right ventricular  size is normal. There is mildly elevated pulmonary artery systolic  pressure.   3. The mitral valve is abnormal. Trivial mitral valve regurgitation. No  evidence of mitral stenosis.   4. The aortic valve is tricuspid. There is mild calcification of the  aortic valve. Aortic valve regurgitation is trivial. Aortic valve  sclerosis is present, with no evidence of aortic valve stenosis.   5. The inferior vena cava is normal in size with greater than 50%  respiratory variability, suggesting right atrial pressure of 3 mmHg.     Neuro/Psych negative  neurological ROS     GI/Hepatic Neg liver ROS,GERD  Medicated,,  Endo/Other  negative endocrine ROS    Renal/GU negative Renal ROSL nephrectomy      Musculoskeletal Closed displaced fracture of greater tuberosity of left humerus   Abdominal   Peds  Hematology negative hematology ROS (+)   Anesthesia Other Findings   Reproductive/Obstetrics                              Anesthesia Physical Anesthesia Plan  ASA: 4  Anesthesia Plan: General   Post-op Pain Management: Regional block* and Tylenol  PO (pre-op)*   Induction: Intravenous  PONV Risk Score and Plan: 2 and Dexamethasone  and Ondansetron   Airway Management Planned: Oral ETT  Additional Equipment:   Intra-op Plan:   Post-operative Plan: Extubation in OR  Informed Consent: I have reviewed the patients History and Physical, chart, labs and discussed the procedure including the risks, benefits and alternatives for the proposed anesthesia with the patient or authorized representative who has indicated his/her understanding and acceptance.     Dental advisory given  Plan Discussed with: CRNA  Anesthesia Plan Comments: (See PAT note 01/08/2024)         Anesthesia Quick Evaluation

## 2024-01-09 NOTE — Progress Notes (Signed)
 Anesthesia Chart Review   Case: [id number] Date/Time: 01/16/24 0715   Procedure: ARTHROPLASTY, SHOULDER, TOTAL, REVERSE (Left: Shoulder)   Anesthesia type: Choice   Diagnosis: Closed displaced fracture of greater tuberosity of left humerus, initial encounter [S42.252A]   Pre-op diagnosis: Closed displaced fracture of greater tuberosity of left humerus, initial encounter   Location: [facility] ROOM 06 / [facility]   Surgeons: Kay Kemps, MD       DISCUSSION:73 y.o. smoker with h/o HTN, COPD, s/p left carotid subclavian bypass, CAD (CABG 1996, DES 2018), ischemic cardiomyopathy, PVD, closed displaced fracture of left humerus scheduled for above procedure 01/15/2021 with Dr. Kemps Kay.   Pt last seen by pulmonology 11/12/2023. No oxygen desaturation with walking, overnight oxygen test ordered.  Qualified for night time oxygen, started on 2L O2 at night.   Per cardiology preoperative evaluation 10/14/2023, Chart reviewed as part of pre-operative protocol coverage. Given past medical history and time since last visit, based on ACC/AHA guidelines, [name] would be at acceptable risk for the planned procedure without further cardiovascular testing.    Her RCRI is moderate risk, 6.6% risk of major cardiac event.  She is able to complete greater than 4 METS of physical activity.  Follows with vascular surgery, last seen 09/04/2023. Left carotid subclavian bypass known to be occluded.  Bilateral lower extremity peripheral arterial disease, sx stable.  VS: BP (!) 156/81   Pulse 86   Temp 36.6 C (Oral)   Ht 5' 7 (1.702 m)   Wt 44.5 kg   LMP  (LMP Unknown)   SpO2 96%   BMI 15.35 kg/m   PROVIDERS: Stephane Leita DEL, MD is PCP   Cardiologist - Lavona Agent, MD   Pulmonologist:Dewald, Dorn NOVAK, MD  LABS: Labs reviewed: Acceptable for surgery. (all labs ordered are listed, but only abnormal results are displayed)  Labs Reviewed  BASIC METABOLIC PANEL WITH GFR - Abnormal; Notable for the  following components:      Result Value   Creatinine, Ser 1.12 (*)    Calcium  10.6 (*)    GFR, Estimated 52 (*)    All other components within normal limits  CBC - Abnormal; Notable for the following components:   HCT 47.6 (*)    RDW 16.4 (*)    All other components within normal limits  SURGICAL PCR SCREEN     IMAGES:   EKG:   CV: Echo 10/08/2022 1. Left ventricular ejection fraction, by estimation, is 60 to 65%. The  left ventricle has normal function. The left ventricle has no regional  wall motion abnormalities. There is mild left ventricular hypertrophy.  Left ventricular diastolic parameters  are consistent with Grade I diastolic dysfunction (impaired relaxation).  The average left ventricular global longitudinal strain is -16.9 %. The  global longitudinal strain is normal.   2. Right ventricular systolic function is normal. The right ventricular  size is normal. There is mildly elevated pulmonary artery systolic  pressure.   3. The mitral valve is abnormal. Trivial mitral valve regurgitation. No  evidence of mitral stenosis.   4. The aortic valve is tricuspid. There is mild calcification of the  aortic valve. Aortic valve regurgitation is trivial. Aortic valve  sclerosis is present, with no evidence of aortic valve stenosis.   5. The inferior vena cava is normal in size with greater than 50%  respiratory variability, suggesting right atrial pressure of 3 mmHg.  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, jaw (HCC)     Peripheral vascular disease (HCC)    carotid to subclavian artery bypass graft   Pneumonia    Smoker     Past Surgical History:  Procedure Laterality Date   ABDOMINAL AORTOGRAM W/LOWER EXTREMITY N/A 11/21/2022   Procedure: ABDOMINAL AORTOGRAM W/LOWER EXTREMITY;  Surgeon: Court Dorn PARAS, MD;  Location: [facility] INVASIVE CV LAB;  Service: Cardiovascular;  Laterality: N/A;   CAROTID ARTERY - SUBCLAVIAN ARTERY BYPASS GRAFT Left 04/18/2007   COLONOSCOPY  2008   CORONARY ARTERY BYPASS GRAFT     CORONARY/GRAFT ACUTE MI REVASCULARIZATION N/A 05/12/2017   Procedure: Coronary/Graft Acute MI Revascularization;  Surgeon: Wonda Sharper, MD;  Location: [facility] INVASIVE CV LAB;  Service: Cardiovascular;  Laterality: N/A;   DEBRIDEMENT MANDIBLE Left 03/23/2019   Procedure: DEBRIDEMENT LEFT MANDIBLE,SEQUESTRECTOMY;  Surgeon: Joanette Soulier, DMD;  Location: [facility] OR;  Service: Oral Surgery;  Laterality: Left;   EYE SURGERY Bilateral 2020   cataract removal   KIDNEY SURGERY Left    73 yo   LEFT HEART CATH AND CORONARY ANGIOGRAPHY N/A 05/12/2017   Procedure: LEFT HEART CATH AND CORONARY ANGIOGRAPHY;  Surgeon: Wonda Sharper, MD;  Location: [facility] INVASIVE CV LAB;  Service: Cardiovascular;  Laterality: N/A;   Left Nephrectomy Left 10/17/1983   Dr. Sural---StagHornCalculus, Left Kidney Nonfunctioning wiht Acute and Chronic Pyelonephritis   WISDOM TOOTH EXTRACTION     XI ROBOTIC ASSISTED INGUINAL HERNIA REPAIR WITH MESH Left 08/30/2022   Procedure: ROBOTIC LEFT INGUINAL HERNIA REPAIR WITH MESH;  Surgeon: Kinsinger, Herlene Righter, MD;  Location: [facility];  Service: General;  Laterality: Left;    MEDICATIONS:  albuterol  (VENTOLIN  HFA) 108 (90 Base) MCG/ACT inhaler   ALPRAZolam  (XANAX ) 0.25 MG tablet   amLODipine  (NORVASC ) 10 MG tablet   aspirin  81 MG tablet   calcipotriene (DOVONOX) 0.005 % cream   carvedilol  (COREG ) 3.125 MG tablet   cholecalciferol  (VITAMIN D3) 25 MCG (1000 UNIT) tablet   cilostazol  (PLETAL ) 50 MG tablet    desonide (DESOWEN) 0.05 % cream   diphenhydrAMINE (BENADRYL) 25 mg capsule   ezetimibe  (ZETIA ) 10 MG tablet   fexofenadine  (ALLEGRA ) 180 MG tablet   Fluticasone-Umeclidin-Vilant (TRELEGY ELLIPTA ) 200-62.5-25 MCG/ACT AEPB   montelukast  (SINGULAIR ) 10 MG tablet   Multiple Vitamins-Minerals (PRESERVISION AREDS 2) CAPS   nitroGLYCERIN  (NITROSTAT ) 0.4 MG SL tablet   olmesartan  (BENICAR ) 20 MG tablet   omeprazole  (PRILOSEC) 10 MG capsule   rosuvastatin  (CRESTOR ) 40 MG tablet   vitamin B-12 (CYANOCOBALAMIN) 100 MCG tablet   vitamin E 180 MG (400 UNITS) capsule   No current facility-administered medications for this encounter.    Harlene Hoots Ward, PA-C [facility] Pre-Surgical Testing [phone]

## 2024-01-16 ENCOUNTER — Ambulatory Visit (HOSPITAL_COMMUNITY)
Admission: RE | Admit: 2024-01-16 | Discharge: 2024-01-16 | Disposition: A | Discharge status: Home / Self Care | Attending: Orthopedic Surgery | Admitting: Orthopedic Surgery

## 2024-01-16 ENCOUNTER — Ambulatory Visit (HOSPITAL_COMMUNITY): Payer: Self-pay | Admitting: Physician Assistant

## 2024-01-16 ENCOUNTER — Ambulatory Visit (HOSPITAL_BASED_OUTPATIENT_CLINIC_OR_DEPARTMENT_OTHER): Admitting: Anesthesiology

## 2024-01-16 ENCOUNTER — Encounter (HOSPITAL_COMMUNITY)
Admission: RE | Disposition: A | Discharge status: Home / Self Care | Payer: Self-pay | Source: Home / Self Care | Attending: Orthopedic Surgery

## 2024-01-16 ENCOUNTER — Encounter (HOSPITAL_COMMUNITY): Payer: Self-pay | Admitting: Orthopedic Surgery

## 2024-01-16 ENCOUNTER — Ambulatory Visit (HOSPITAL_COMMUNITY)

## 2024-01-16 ENCOUNTER — Other Ambulatory Visit: Payer: Self-pay

## 2024-01-16 DIAGNOSIS — Z955 Presence of coronary angioplasty implant and graft: Secondary | ICD-10-CM | POA: Diagnosis not present

## 2024-01-16 DIAGNOSIS — X58XXXA Exposure to other specified factors, initial encounter: Secondary | ICD-10-CM | POA: Diagnosis not present

## 2024-01-16 DIAGNOSIS — K219 Gastro-esophageal reflux disease without esophagitis: Secondary | ICD-10-CM | POA: Insufficient documentation

## 2024-01-16 DIAGNOSIS — F17210 Nicotine dependence, cigarettes, uncomplicated: Secondary | ICD-10-CM | POA: Diagnosis not present

## 2024-01-16 DIAGNOSIS — I2510 Atherosclerotic heart disease of native coronary artery without angina pectoris: Secondary | ICD-10-CM | POA: Insufficient documentation

## 2024-01-16 DIAGNOSIS — Z951 Presence of aortocoronary bypass graft: Secondary | ICD-10-CM | POA: Diagnosis not present

## 2024-01-16 DIAGNOSIS — S42252A Displaced fracture of greater tuberosity of left humerus, initial encounter for closed fracture: Secondary | ICD-10-CM | POA: Insufficient documentation

## 2024-01-16 DIAGNOSIS — J449 Chronic obstructive pulmonary disease, unspecified: Secondary | ICD-10-CM

## 2024-01-16 DIAGNOSIS — I739 Peripheral vascular disease, unspecified: Secondary | ICD-10-CM | POA: Insufficient documentation

## 2024-01-16 DIAGNOSIS — I252 Old myocardial infarction: Secondary | ICD-10-CM

## 2024-01-16 DIAGNOSIS — I10 Essential (primary) hypertension: Secondary | ICD-10-CM | POA: Insufficient documentation

## 2024-01-16 HISTORY — PX: REVERSE SHOULDER ARTHROPLASTY: SHX5054

## 2024-01-16 SURGERY — ARTHROPLASTY, SHOULDER, TOTAL, REVERSE
Anesthesia: General | Site: Shoulder | Laterality: Left

## 2024-01-16 MED ORDER — ONDANSETRON HCL 4 MG/2ML IJ SOLN
INTRAMUSCULAR | Status: DC | PRN
  Administered 2024-01-16: 4 mg via INTRAVENOUS

## 2024-01-16 MED ORDER — MIDAZOLAM HCL 2 MG/2ML IJ SOLN
INTRAMUSCULAR | Status: AC
  Filled 2024-01-16: qty 2

## 2024-01-16 MED ORDER — LIDOCAINE 2% (20 MG/ML) 5 ML SYRINGE
INTRAMUSCULAR | Status: DC | PRN
  Administered 2024-01-16: 40 mg via INTRAVENOUS

## 2024-01-16 MED ORDER — BUPIVACAINE HCL (PF) 0.5 % IJ SOLN
INTRAMUSCULAR | Status: DC | PRN
  Administered 2024-01-16: 10 mL via PERINEURAL

## 2024-01-16 MED ORDER — VANCOMYCIN HCL 1000 MG IV SOLR
INTRAVENOUS | Status: AC
  Filled 2024-01-16: qty 20

## 2024-01-16 MED ORDER — FENTANYL CITRATE PF 50 MCG/ML IJ SOSY: 25.0000 ug | PREFILLED_SYRINGE | INTRAMUSCULAR | Status: DC | PRN

## 2024-01-16 MED ORDER — PHENYLEPHRINE HCL-NACL 20-0.9 MG/250ML-% IV SOLN
INTRAVENOUS | Status: DC | PRN
  Administered 2024-01-16: 35 ug/min via INTRAVENOUS

## 2024-01-16 MED ORDER — ACETAMINOPHEN 10 MG/ML IV SOLN
INTRAVENOUS | Status: AC
  Filled 2024-01-16: qty 100

## 2024-01-16 MED ORDER — BUPIVACAINE-EPINEPHRINE (PF) 0.25% -1:200000 IJ SOLN
INTRAMUSCULAR | Status: AC
Start: 2024-01-16 — End: 2024-01-16
  Filled 2024-01-16: qty 30

## 2024-01-16 MED ORDER — ROCURONIUM BROMIDE 10 MG/ML (PF) SYRINGE
PREFILLED_SYRINGE | INTRAVENOUS | Status: DC | PRN
  Administered 2024-01-16: 30 mg via INTRAVENOUS

## 2024-01-16 MED ORDER — CEFAZOLIN SODIUM-DEXTROSE 2-4 GM/100ML-% IV SOLN
2.0000 g | INTRAVENOUS | Status: AC
Start: 2024-01-16 — End: 2024-01-16
  Administered 2024-01-16: 2 g via INTRAVENOUS
  Filled 2024-01-16: qty 100

## 2024-01-16 MED ORDER — SUGAMMADEX SODIUM 200 MG/2ML IV SOLN
INTRAVENOUS | Status: DC | PRN
  Administered 2024-01-16: 100 mg via INTRAVENOUS

## 2024-01-16 MED ORDER — ONDANSETRON HCL 4 MG/2ML IJ SOLN: 4.0000 mg | Freq: Once | INTRAMUSCULAR | Status: DC | PRN

## 2024-01-16 MED ORDER — ALBUTEROL SULFATE HFA 108 (90 BASE) MCG/ACT IN AERS
INHALATION_SPRAY | RESPIRATORY_TRACT | Status: DC | PRN
  Administered 2024-01-16: 4 via RESPIRATORY_TRACT

## 2024-01-16 MED ORDER — BUPIVACAINE LIPOSOME 1.3 % IJ SUSP
INTRAMUSCULAR | Status: DC | PRN
  Administered 2024-01-16: 10 mL via PERINEURAL

## 2024-01-16 MED ORDER — PROPOFOL 10 MG/ML IV BOLUS
INTRAVENOUS | Status: AC
  Filled 2024-01-16: qty 20

## 2024-01-16 MED ORDER — FENTANYL CITRATE (PF) 250 MCG/5ML IJ SOLN
INTRAMUSCULAR | Status: DC | PRN
  Administered 2024-01-16 (×4): 25 ug via INTRAVENOUS

## 2024-01-16 MED ORDER — LACTATED RINGERS IV SOLN: INTRAVENOUS | Status: DC | PRN

## 2024-01-16 MED ORDER — 0.9 % SODIUM CHLORIDE (POUR BTL) OPTIME
TOPICAL | Status: DC | PRN
  Administered 2024-01-16: 1000 mL

## 2024-01-16 MED ORDER — FENTANYL CITRATE (PF) 100 MCG/2ML IJ SOLN
INTRAMUSCULAR | Status: AC
Start: 2024-01-16 — End: 2024-01-16
  Filled 2024-01-16: qty 2

## 2024-01-16 MED ORDER — BUPIVACAINE-EPINEPHRINE (PF) 0.25% -1:200000 IJ SOLN
INTRAMUSCULAR | Status: DC | PRN
  Administered 2024-01-16: 10 mL

## 2024-01-16 MED ORDER — PROPOFOL 10 MG/ML IV BOLUS
INTRAVENOUS | Status: DC | PRN
  Administered 2024-01-16: 80 mg via INTRAVENOUS

## 2024-01-16 MED ORDER — LACTATED RINGERS IV SOLN: INTRAVENOUS | Status: DC

## 2024-01-16 MED ORDER — METHOCARBAMOL 500 MG PO TABS: 500.0000 mg | ORAL_TABLET | Freq: Three times a day (TID) | ORAL | 1 refills | Status: DC | PRN

## 2024-01-16 MED ORDER — OXYCODONE-ACETAMINOPHEN 5-325 MG PO TABS: 1.0000 | ORAL_TABLET | ORAL | 0 refills | Status: DC | PRN

## 2024-01-16 MED ORDER — PHENYLEPHRINE HCL-NACL 20-0.9 MG/250ML-% IV SOLN
INTRAVENOUS | Status: AC
  Filled 2024-01-16: qty 500

## 2024-01-16 MED ORDER — PROPOFOL 10 MG/ML IV BOLUS
INTRAVENOUS | Status: AC
Start: 2024-01-16 — End: 2024-01-16
  Filled 2024-01-16: qty 20

## 2024-01-16 MED ORDER — MIDAZOLAM HCL 2 MG/2ML IJ SOLN
INTRAMUSCULAR | Status: DC | PRN
  Administered 2024-01-16 (×2): 1 mg via INTRAVENOUS

## 2024-01-16 MED ORDER — TRANEXAMIC ACID-NACL 1000-0.7 MG/100ML-% IV SOLN
1000.0000 mg | INTRAVENOUS | Status: AC
  Administered 2024-01-16: 1000 mg via INTRAVENOUS
  Filled 2024-01-16: qty 100

## 2024-01-16 MED ORDER — ACETAMINOPHEN 500 MG PO TABS
1000.0000 mg | ORAL_TABLET | Freq: Once | ORAL | Status: AC
  Administered 2024-01-16: 1000 mg via ORAL
  Filled 2024-01-16: qty 2

## 2024-01-16 MED ORDER — VANCOMYCIN HCL 1000 MG IV SOLR
INTRAVENOUS | Status: DC | PRN
  Administered 2024-01-16: 1000 mg via TOPICAL

## 2024-01-16 MED ORDER — PHENYLEPHRINE 80 MCG/ML (10ML) SYRINGE FOR IV PUSH (FOR BLOOD PRESSURE SUPPORT)
PREFILLED_SYRINGE | INTRAVENOUS | Status: DC | PRN
  Administered 2024-01-16 (×3): 80 ug via INTRAVENOUS

## 2024-01-16 MED ORDER — DEXAMETHASONE SODIUM PHOSPHATE 10 MG/ML IJ SOLN
INTRAMUSCULAR | Status: DC | PRN
  Administered 2024-01-16: 5 mg via INTRAVENOUS

## 2024-01-16 MED ORDER — ORAL CARE MOUTH RINSE: 15.0000 mL | Freq: Once | OROMUCOSAL | Status: AC

## 2024-01-16 MED ORDER — ONDANSETRON HCL 4 MG PO TABS: 4.0000 mg | ORAL_TABLET | Freq: Three times a day (TID) | ORAL | 1 refills | Status: DC | PRN

## 2024-01-16 MED ORDER — CHLORHEXIDINE GLUCONATE 0.12 % MT SOLN
15.0000 mL | Freq: Once | OROMUCOSAL | Status: AC
  Administered 2024-01-16: 15 mL via OROMUCOSAL

## 2024-01-16 SURGICAL SUPPLY — 62 items
BAG COUNTER SPONGE SURGICOUNT (BAG) IMPLANT
BAG ZIPLOCK 12X15 (MISCELLANEOUS) IMPLANT
BIT DRILL 1.6MX128 (BIT) IMPLANT
BIT DRILL 170X2.5X (BIT) IMPLANT
BLADE SAG 18X100X1.27 (BLADE) ×1 IMPLANT
COVER BACK TABLE 60X90IN (DRAPES) ×1 IMPLANT
COVER SURGICAL LIGHT HANDLE (MISCELLANEOUS) ×1 IMPLANT
DRAPE INCISE IOBAN 66X45 STRL (DRAPES) ×1 IMPLANT
DRAPE POUCH INSTRU U-SHP 10X18 (DRAPES) ×1 IMPLANT
DRAPE SHEET LG 3/4 BI-LAMINATE (DRAPES) ×1 IMPLANT
DRAPE SURG ORHT 6 SPLT 77X108 (DRAPES) ×2 IMPLANT
DRAPE TOP 10253 STERILE (DRAPES) ×1 IMPLANT
DRAPE U-SHAPE 47X51 STRL (DRAPES) ×1 IMPLANT
DRSG ADAPTIC 3X8 NADH LF (GAUZE/BANDAGES/DRESSINGS) ×1 IMPLANT
DRSG AQUACEL AG ADV 3.5X10 (GAUZE/BANDAGES/DRESSINGS) ×1 IMPLANT
DURAPREP 26ML APPLICATOR (WOUND CARE) ×1 IMPLANT
ELECT BLADE TIP CTD 4 INCH (ELECTRODE) ×1 IMPLANT
ELECT NDL TIP 2.8 STRL (NEEDLE) ×1 IMPLANT
ELECT NEEDLE TIP 2.8 STRL (NEEDLE) ×1 IMPLANT
ELECT PENCIL ROCKER SW 15FT (MISCELLANEOUS) ×1 IMPLANT
ELECT REM PT RETURN 15FT ADLT (MISCELLANEOUS) ×1 IMPLANT
EPIPHYSIS LT SZ 1 (Orthopedic Implant) IMPLANT
FACESHIELD WRAPAROUND OR TEAM (MASK) ×1 IMPLANT
GAUZE PAD ABD 8X10 STRL (GAUZE/BANDAGES/DRESSINGS) ×1 IMPLANT
GAUZE SPONGE 4X4 12PLY STRL (GAUZE/BANDAGES/DRESSINGS) ×1 IMPLANT
GLENOSPHERE DELTA XTEND LAT 38 (Miscellaneous) IMPLANT
GLOVE BIOGEL PI IND STRL 7.5 (GLOVE) ×1 IMPLANT
GLOVE BIOGEL PI IND STRL 8.5 (GLOVE) ×1 IMPLANT
GLOVE ORTHO TXT STRL SZ7.5 (GLOVE) ×1 IMPLANT
GLOVE SURG ORTHO 8.5 STRL (GLOVE) ×1 IMPLANT
GOWN STRL REUS W/ TWL XL LVL3 (GOWN DISPOSABLE) ×2 IMPLANT
KIT BASIN OR (CUSTOM PROCEDURE TRAY) ×1 IMPLANT
KIT TURNOVER KIT A (KITS) ×1 IMPLANT
MANIFOLD NEPTUNE II (INSTRUMENTS) ×1 IMPLANT
METAGLENE DELTA EXTEND (Trauma) IMPLANT
NDL MAYO CATGUT SZ4 TPR NDL (NEEDLE) IMPLANT
NEEDLE MAYO CATGUT SZ4 (NEEDLE) ×1 IMPLANT
NS IRRIG 1000ML POUR BTL (IV SOLUTION) ×1 IMPLANT
PACK SHOULDER (CUSTOM PROCEDURE TRAY) ×1 IMPLANT
PIN GUIDE 1.2 (PIN) IMPLANT
PIN GUIDE GLENOPHERE 1.5MX300M (PIN) IMPLANT
PIN METAGLENE 2.5 (PIN) IMPLANT
RESTRAINT HEAD UNIVERSAL NS (MISCELLANEOUS) ×1 IMPLANT
SCREW LOCK 42 (Screw) IMPLANT
SCREW LOCK DELTA XTEND 4.5X30 (Screw) IMPLANT
SLING ARM FOAM STRAP LRG (SOFTGOODS) IMPLANT
SLING ARM IMMOBILIZER MED (SOFTGOODS) IMPLANT
SPACER 38 PLUS 3 (Spacer) IMPLANT
SPIKE FLUID TRANSFER (MISCELLANEOUS) ×1 IMPLANT
STEM DELTA DIA 10 HA (Stem) IMPLANT
STRIP CLOSURE SKIN 1/2X4 (GAUZE/BANDAGES/DRESSINGS) ×1 IMPLANT
SUT MNCRL AB 4-0 PS2 18 (SUTURE) ×1 IMPLANT
SUT VIC AB 0 CT1 36 (SUTURE) ×1 IMPLANT
SUT VIC AB 0 CT2 27 (SUTURE) ×1 IMPLANT
SUT VIC AB 2-0 CT1 TAPERPNT 27 (SUTURE) ×1 IMPLANT
SUTURE FIBERWR #2 38 T-5 BLUE (SUTURE) ×2 IMPLANT
SUTURE FIBERWR#2 38 REV NDL BL (SUTURE) IMPLANT
TAPE CLOTH SURG 4X10 WHT LF (GAUZE/BANDAGES/DRESSINGS) IMPLANT
TOWEL GREEN STERILE FF (TOWEL DISPOSABLE) ×1 IMPLANT
TOWEL OR 17X26 10 PK STRL BLUE (TOWEL DISPOSABLE) ×1 IMPLANT
WATER STERILE IRR 1000ML POUR (IV SOLUTION) IMPLANT
YANKAUER SUCT BULB TIP NO VENT (SUCTIONS) ×1 IMPLANT

## 2024-01-16 NOTE — Evaluation (Signed)
 Occupational Therapy Evaluation Patient Details Name: [name] MRN: [id number] DOB: 1951-04-23 Today's Date: 01/16/2024   History of Present Illness   [name] is a 73 yo female s/p L Reverse TSA on 01/16/24. PMH: COPD, CAD, GERD, HTN, MI, PVD     Clinical Impressions PTA pt lives alone but will be discharging to her friend's house and has baseline O2 at home as MD requiring 2 ltrs at home for now. Daughter present for session and for discharge instructions and training .  Education completed regarding compensatory strategies for ADL tasks and functional mobility, management of sling, L ROM per specified parameters in the order set as indicated below, positioning of operative arm in sitting and supine and edema control, including use of regular ice packs for cold therapy. Caregiver present for education, written handouts provided and reviewed using Teach Back and pt/caregiver verbalized/demonstrated understanding. Due to the below listed deficits, pt requires minimal assistance with ADL tasks and indep on level surfaces assist with functional mobility. Caregiver will be able to provide necessary level of assistance at discharge. Pt to follow up with MD to progress rehab of the operative shoulder.      If plan is discharge home, recommend the following:   A little help with walking and/or transfers;A little help with bathing/dressing/bathroom;Assist for transportation;Help with stairs or ramp for entrance (amb indep on level surfaces)     Functional Status Assessment   Patient has had a recent decline in their functional status and demonstrates the ability to make significant improvements in function in a reasonable and predictable amount of time.     Equipment Recommendations   None recommended by OT      Precautions/Restrictions   Precautions Precautions: Shoulder Required Braces or Orthoses: Sling No shoulder ROM allowed; elbow/wrist/hand AROM  only.  Restrictions Weight Bearing Restrictions Per Provider Order: Yes LUE Weight Bearing Per Provider Order: Non weight bearing     Mobility Bed Mobility Overal bed mobility:  (eval completed recliner level but educated on bed mobility and positioning)                  Transfers Overall transfer level: Modified independent Equipment used: None               General transfer comment: min cues for pacing off and on hallway toilet ambulator level with daughter present for OT education      Balance Overall balance assessment: No apparent balance deficits (not formally assessed)                                         ADL either performed or assessed with clinical judgement   ADL Overall ADL's : Needs assistance/impaired    Per orders, L shoulder parameters as follows for ADL tasks: No shoulder ROM; elbow/wrist/hand ROM only. While moving within specified parameters, pt/caregiver instructed on bathing and how to donn/doff shirt, placing operative arm through sleeve first when donning and off last when doffing.Pt/caregiver educated on compensatory strategies for LB ADL and strategies to reduce risk of falls.  Pt/caregiver educated on donning/doffing sling and to wear the sling at all times with the exception of ADL, and to loosen the neck strap of the sling when the operative arm is in a supported position when sitting. In sitting or supine, pt instructed to have a pillow behind and under their operative arm to provide support. If  assist needed with ambulation, caregiver educated on the importance of walking on pt's non-operative side.  Education regarding use of Cold Therapy regular ice packs completed, including the importance of using a barrier on the shoulder prior to positioning the pack. Pt/caregiver verbalized/demonstrated understanding. Teach Back used while caregiver assisted with dressing pt and positioning the ice to facilitate DC.                                           Vision Baseline Vision/History: 1 Wears glasses;0 No visual deficits Ability to See in Adequate Light: 0 Adequate Vision Assessment?: No apparent visual deficits            Pertinent Vitals/Pain Pain Assessment Pain Assessment: No/denies pain     Extremity/Trunk Assessment Upper Extremity Assessment Upper Extremity Assessment: Right hand dominant;LUE deficits/detail LUE Deficits / Details: L UE post operative nerve block remains active, L elbow, wrist, hand WFL's for AAROM LUE Coordination: decreased fine motor;decreased gross motor   Lower Extremity Assessment Lower Extremity Assessment: Overall WFL for tasks assessed   Cervical / Trunk Assessment Cervical / Trunk Assessment: Normal   Communication Communication Communication: No apparent difficulties   Cognition Arousal: Lethargic, Alert (progessed to fully alert after session) Behavior During Therapy: Flat affect Cognition: No apparent impairments             OT - Cognition Comments: baseline tends to [facility] dtr reports                 Following commands: Intact       Cueing  General Comments   Cueing Techniques: Verbal cues  L post op incision covered with intact dressing   Exercises Exercises: Shoulder Shoulder Exercises Elbow Flexion: 10 reps, Left, AAROM Elbow Extension: AAROM, Left, 10 reps Wrist Flexion: AAROM, 10 reps, Left Wrist Extension: AAROM, Left, 10 reps Digit Composite Flexion: AAROM, 10 reps, Left Composite Extension: AAROM, Left, 10 reps   Shoulder Instructions Shoulder Instructions Donning/doffing shirt without moving shoulder: Caregiver independent with task;Patient able to independently direct caregiver Method for sponge bathing under operated UE: Caregiver independent with task;Patient able to independently direct caregiver Donning/doffing sling/immobilizer: Caregiver independent with task;Patient able to independently direct  caregiver Correct positioning of sling/immobilizer: Caregiver independent with task;Patient able to independently direct caregiver ROM for elbow, wrist and digits of operated UE: Caregiver independent with task;Patient able to independently direct caregiver Sling wearing schedule (on at all times/off for ADL's): Caregiver independent with task;Patient able to independently direct caregiver Proper positioning of operated UE when showering: Caregiver independent with task;Patient able to independently direct caregiver Positioning of UE while sleeping: Caregiver independent with task;Patient able to independently direct caregiver    Home Living Family/patient expects to be discharged to:: Private residence Living Arrangements: Non-relatives/Friends Available Help at Discharge: Friend(s) Type of Home: House Home Access: Stairs to enter   Entrance Stairs-Rails: None       Bathroom Shower/Tub: Producer, television/film/video: Handicapped height     Home Equipment: None          Prior Functioning/Environment Prior Level of Function : Independent/Modified Independent                    OT Problem List: Impaired UE functional use    AM-PAC OT 6 Clicks Daily Activity     Outcome Measure Help from another person eating meals?: None  Help from another person taking care of personal grooming?: A Little Help from another person toileting, which includes using toliet, bedpan, or urinal?: A Little Help from another person bathing (including washing, rinsing, drying)?: A Little Help from another person to put on and taking off regular upper body clothing?: A Little Help from another person to put on and taking off regular lower body clothing?: A Little 6 Click Score: 19   End of Session Equipment Utilized During Treatment: Gait belt Nurse Communication: Mobility status;Weight bearing status;Precautions;Other (comment) (completed toileting)  Activity Tolerance: Patient  tolerated treatment well Patient left: in chair;with call bell/phone within reach;with family/visitor present  OT Visit Diagnosis: Other (comment) (L UE dysfunction)                Time: 1306-1400 OT Time Calculation (min): 54 min Charges:  OT General Charges $OT Visit: 1 Visit OT Evaluation $OT Eval Low Complexity: 1 Low OT Treatments $Self Care/Home Management : 23-37 mins  [name] OT/L Acute Rehabilitation Department  [phone]  01/16/2024, 2:21 PM

## 2024-01-16 NOTE — Anesthesia Procedure Notes (Signed)
 Anesthesia Regional Block: Interscalene brachial plexus block   Pre-Anesthetic Checklist: , timeout performed,  Correct Patient, Correct Site, Correct Laterality,  Correct Procedure, Correct Position, site marked,  Risks and benefits discussed,  Surgical consent,  Pre-op evaluation,  At surgeon's request and post-op pain management  Laterality: Left  Prep: chloraprep       Needles:  Injection technique: Single-shot  Needle Type: Echogenic Stimulator Needle     Needle Length: 9cm  Needle Gauge: 22     Additional Needles:   Procedures:,,,, ultrasound used (permanent image in chart),,    Narrative:  Start time: 01/16/2024 6:50 AM End time: 01/16/2024 6:57 AM Injection made incrementally with aspirations every 5 mL.  Performed by: Personally  Anesthesiologist: Corinne Garnette BRAVO, MD  Additional Notes: Functioning IV was confirmed and monitors were applied.  A 90mm 22ga echogenic stimulator needle was used. Sterile prep and drape, hand hygiene, and sterile gloves were used.  Negative aspiration and negative test dose prior to incremental administration of local anesthetic. The patient tolerated the procedure well.  Ultrasound guidance: relevent anatomy identified, needle position confirmed, local anesthetic spread visualized around nerve(s), vascular puncture avoided.  Image printed for medical record.

## 2024-01-16 NOTE — Brief Op Note (Signed)
 01/16/2024  9:44 AM  PATIENT:  [name]  73 y.o. female  PRE-OPERATIVE DIAGNOSIS:  Closed displaced fracture of greater tuberosity of left humerus, initial encounter  POST-OPERATIVE DIAGNOSIS:  Closed displaced fracture of greater tuberosity of left humerus, initial encounter  PROCEDURE:  Procedure(s): ARTHROPLASTY, SHOULDER, TOTAL, REVERSE (Left) no Subscap repair DePuy Delta Xtend  SURGEON:  Surgeons and Role:    DEWAINE Kay Kemps, MD - Primary  PHYSICIAN ASSISTANT:   ASSISTANTS: Debby KATHEE Fireman, PA-C   ANESTHESIA:   regional and general  EBL:  minimal  BLOOD ADMINISTERED:none  DRAINS: none   LOCAL MEDICATIONS USED:  MARCAINE      SPECIMEN:  No Specimen  DISPOSITION OF SPECIMEN:  N/A  COUNTS:  YES  TOURNIQUET:  * No tourniquets in log *  DICTATION: .Other Dictation: Dictation Number [id number]  PLAN OF CARE: Discharge to home after PACU  PATIENT DISPOSITION:  PACU - hemodynamically stable.   Delay start of Pharmacological VTE agent (>24hrs) due to surgical blood loss or risk of bleeding: not applicable

## 2024-01-16 NOTE — Anesthesia Procedure Notes (Signed)
 Procedure Name: Intubation Date/Time: 01/16/2024 7:45 AM  Performed by: Arvell Edsel HERO, CRNAPre-anesthesia Checklist: Patient identified, Emergency Drugs available, Suction available, Patient being monitored and Timeout performed Patient Re-evaluated:Patient Re-evaluated prior to induction Oxygen Delivery Method: Circle system utilized Preoxygenation: Pre-oxygenation with 100% oxygen Induction Type: IV induction Ventilation: Mask ventilation without difficulty Laryngoscope Size: Mac and 3 Grade View: Grade I Tube size: 7.0 mm Number of attempts: 1 Airway Equipment and Method: Stylet and Patient positioned with wedge pillow Placement Confirmation: ETT inserted through vocal cords under direct vision, positive ETCO2, CO2 detector and breath sounds checked- equal and bilateral Secured at: 22 cm Tube secured with: Tape

## 2024-01-16 NOTE — Interval H&P Note (Signed)
 History and Physical Interval Note:  01/16/2024 7:20 AM  [name]  has presented today for surgery, with the diagnosis of Closed displaced fracture of greater tuberosity of left humerus, initial encounter.  The various methods of treatment have been discussed with the patient and family. After consideration of risks, benefits and other options for treatment, the patient has consented to  Procedure(s): ARTHROPLASTY, SHOULDER, TOTAL, REVERSE (Left) as a surgical intervention.  The patient's history has been reviewed, patient examined, no change in status, stable for surgery.  I have reviewed the patient's chart and labs.  Questions were answered to the patient's satisfaction.     [name]

## 2024-01-16 NOTE — Transfer of Care (Signed)
 Immediate Anesthesia Transfer of Care Note  Patient: [name]  Procedure(s) Performed: ARTHROPLASTY, SHOULDER, TOTAL, REVERSE (Left: Shoulder)  Patient Location: PACU  Anesthesia Type:General  Level of Consciousness: awake, alert , and oriented  Airway & Oxygen Therapy: Patient Spontanous Breathing and Patient connected to face mask oxygen  Post-op Assessment: Report given to RN, Post -op Vital signs reviewed and stable, Patient moving all extremities, and Patient moving all extremities X 4  Post vital signs: Reviewed and stable  Last Vitals:  Vitals Value Taken Time  BP 112/50 01/16/24 09:45  Temp    Pulse 64 01/16/24 09:51  Resp 13 01/16/24 09:51  SpO2 91 % 01/16/24 09:51  Vitals shown include unfiled device data.  Last Pain:  Vitals:   01/16/24 0630  TempSrc:   PainSc: 0-No pain         Complications: No notable events documented.

## 2024-01-16 NOTE — Discharge Instructions (Signed)
 Ice to the shoulder constantly.  Keep the incision covered and clean and dry for one week, then ok to get it wet in the shower. Change to the Aquacel bandage on Monday and then ok to get it wet in the shower with the waterproof bandage.  Do exercise as instructed several times per day.  DO NOT reach behind your back or push up out of a chair with the operative arm.  Use a sling while you are up and around for comfort, may remove while seated.  Keep pillow propped behind the operative elbow.  Follow up with Dr Kay in two weeks in the office, call [phone] for appt  Please call Dr Kay (cell) [phone] with any questions or concerns

## 2024-01-16 NOTE — Op Note (Signed)
 NAME: [name], [name] MEDICAL RECORD NO: [id number] ACCOUNT NO: [id number] DATE OF BIRTH: Sep 05, 1950 FACILITY: [facility] LOCATION: [facility] PHYSICIAN: Elspeth SAUNDERS. Kay, MD  Operative Report   DATE OF PROCEDURE: 01/16/2024  PREOPERATIVE DIAGNOSIS:  Left shoulder greater tuberosity fracture with poor function and severe pain.  POSTOPERATIVE DIAGNOSES: 1.  Left shoulder greater tuberosity fracture with poor function and severe pain. 2.  Nonunion of the greater tuberosity fracture.  PROCEDURE PERFORMED:  Left reverse total shoulder arthroplasty using DePuy Delta Xtend prosthesis with no subscapularis repair.  ATTENDING SURGEON:  Elspeth SAUNDERS. Kay, MD  ASSISTANT:  Debby Crock [place], [place], who was scrubbed during the entire procedure, and necessary for satisfactory completion of surgery.  ANESTHESIA:  General anesthesia was used plus interscalene block.  ESTIMATED BLOOD LOSS:  100 mL.  FLUID REPLACEMENT:  1200 mL crystalloid.  COUNTS:  Instrument count was correct.  COMPLICATIONS:  There were no complications.  ANTIBIOTICS:  Perioperative antibiotics were given.  INDICATIONS:  The patient is a 73 year old female who suffered a greater tuberosity fracture that displaced severely and did not heal.  The patient presented to my clinic complaining of severe shoulder pain and dysfunction with nonfunctional range of  motion of the shoulder.  We discussed all options for management, recommending reverse total shoulder arthroplasty to restore arm elevation, eliminate pain, and improve her quality of life.  The patient agreed to this plan.  Informed consent was  obtained.  DESCRIPTION OF PROCEDURE:  After an adequate level of anesthesia was achieved, the patient was positioned in the modified beach chair position.  The left shoulder was correctly identified, and sterile prep and drape performed.  Timeout called verifying  correct patient and correct site.  We entered the patient's shoulder using a  standard deltopectoral approach starting at the coracoid process and extending down the anterior humerus.  Dissection down through the subcutaneous tissues using Bovie.  The  cephalic vein was identified and retracted laterally with the deltoid.  Pectoralis was taken medially.  The conjoined tendon was retracted medially.  Deep retractor was placed.  We tenodesed the biceps in situ with a 0 Vicryl figure-of-eight suture  incorporating part of the pectoralis tendon.  We then released the subscapularis subperiosteally off the lesser tuberosity and tagged with #2 FiberWire suture for potential repair.  We were hoping to potentially be able to grab the greater tuberosity and  the rotator cuff, which was attached to that and create a repair of her tuberosities and her rotator cuff tendons.  Once we had that done, we released the anterior cuff.  We then extended the shoulder to the delivering the humeral head out of the wound.   The patient's greater tuberosity had displaced posteriorly with the infraspinatus and the teres minor attached to it.  Next, we went ahead and entered the proximal humerus with a 6-mm reamer reaming up to size 10.  We then placed our 10-mm T-handle  guide and resected the head at 20 degrees of retroversion with the oscillating saw.  We removed that to the back table and used half for bone graft.  Next, we retracted the shoulder posteriorly, gaining good exposure of the glenoid.  We placed our deep  retractors, removed the glenoid labrum.  We also removed the biceps anchor.  We removed the remaining cartilage.  We then found our center point low on the glenoid face and drilled our guide pin bicortically.  We then reamed the subchondral bone for the  metaglene baseplate.  Next, we did  our peripheral hand reaming.  Then, we drilled out our central peg hole.  We irrigated and impacted the HA-coated press fit baseplate into position.  We placed a 42-mm screw inferiorly, a 30-mm screw superiorly  locking  those screws.  The plate was secured.  We then selected a 38-mm standard glenosphere and attached that to the baseplate.  Next, we went to the humeral side reaming for the one left metaphysis and then trialing with the 10 stem in the one left metaphysis  set on the 0 setting and placed in 20 degrees of retroversion.  I then went ahead and reduced the tuberosity.  I could just get it reduced with the patient externally rotated a little bit, but the minute we internally rotated hand on stomach, the  tuberosity displaced.  I tried everything we could to free up the tuberosity and try to get it to where it would move forward to be able to actually have a chance to heal.  It would not.  Thus, we made a decision to resect the tuberosity fragment, which  was a large piece of bone in the back of the shoulder so as not to block rotation.  We did that.  We then went ahead and irrigated thoroughly.  We then used the available bone graft from the humeral head in impaction grafting technique to impact the 10  HA-coated stem with the 1 left HA-coated metaphysis set on the 0 setting and impacted in 20 degrees of retroversion.  With the bone graft, we had a very stable stem with no cement.  We then used a 38 +3 poly, placed it on the humeral tray, impacted that  and reduced the shoulder.  Nice, little pop as it reduced.  Very stable shoulder throughout a full range of motion.  Excellent tensioning.  We then resected the subscapularis remnant.  We irrigated thoroughly, repairing the deltopectoral interval with 0  Vicryl suture followed by 2-0 Vicryl for subcutaneous closure and 4-0 Monocryl for skin.  Steri-Strips were applied followed by sterile dressing.  The patient tolerated the surgery well.   PUS D: 01/16/2024 9:49:23 am T: 01/16/2024 11:33:00 am  JOB: [id number]/ [id number]

## 2024-01-16 NOTE — Anesthesia Postprocedure Evaluation (Signed)
 Anesthesia Post Note  Patient: [name]  Procedure(s) Performed: ARTHROPLASTY, SHOULDER, TOTAL, REVERSE (Left: Shoulder)     Patient location during evaluation: PACU Anesthesia Type: General Level of consciousness: awake and alert Pain management: pain level controlled Vital Signs Assessment: post-procedure vital signs reviewed and stable Respiratory status: spontaneous breathing, nonlabored ventilation and respiratory function stable Cardiovascular status: blood pressure returned to baseline and stable Postop Assessment: no apparent nausea or vomiting Anesthetic complications: no   No notable events documented.  Last Vitals:  Vitals:   01/16/24 1245 01/16/24 1300  BP: (!) 109/44 (!) 115/50  Pulse: 60 60  Resp: 19   Temp:    SpO2: (!) 87% 96%    Last Pain:  Vitals:   01/16/24 1300  TempSrc:   PainSc: 0-No pain                 Garnette FORBES Skillern

## 2024-01-19 ENCOUNTER — Encounter (HOSPITAL_COMMUNITY): Payer: Self-pay | Admitting: Orthopedic Surgery

## 2024-02-01 ENCOUNTER — Other Ambulatory Visit: Payer: Self-pay | Admitting: Pulmonary Disease

## 2024-02-01 DIAGNOSIS — J449 Chronic obstructive pulmonary disease, unspecified: Secondary | ICD-10-CM

## 2024-03-24 ENCOUNTER — Other Ambulatory Visit: Payer: Medicare Other

## 2024-04-08 ENCOUNTER — Ambulatory Visit: Admitting: Pulmonary Disease

## 2024-04-21 ENCOUNTER — Ambulatory Visit: Attending: Cardiology | Admitting: Cardiovascular Disease

## 2024-04-21 ENCOUNTER — Encounter: Payer: Self-pay | Admitting: Cardiovascular Disease

## 2024-04-21 VITALS — BP 122/78 | HR 87 | Ht 67.0 in | Wt 99.6 lb

## 2024-04-21 DIAGNOSIS — I739 Peripheral vascular disease, unspecified: Secondary | ICD-10-CM

## 2024-04-21 DIAGNOSIS — I6523 Occlusion and stenosis of bilateral carotid arteries: Secondary | ICD-10-CM

## 2024-04-21 MED ORDER — EZETIMIBE 10 MG PO TABS: 10.0000 mg | ORAL_TABLET | Freq: Every day | ORAL | 4 refills | Status: DC

## 2024-04-21 MED ORDER — OLMESARTAN MEDOXOMIL 20 MG PO TABS: 20.0000 mg | ORAL_TABLET | Freq: Every day | ORAL | 4 refills | Status: DC

## 2024-04-21 MED ORDER — CILOSTAZOL 50 MG PO TABS: 50.0000 mg | ORAL_TABLET | Freq: Two times a day (BID) | ORAL | 4 refills | Status: AC

## 2024-04-21 MED ORDER — ROSUVASTATIN CALCIUM 40 MG PO TABS: 40.0000 mg | ORAL_TABLET | Freq: Every day | ORAL | 4 refills | Status: AC

## 2024-04-21 MED ORDER — CARVEDILOL 3.125 MG PO TABS: 3.1250 mg | ORAL_TABLET | Freq: Two times a day (BID) | ORAL | 4 refills | Status: AC

## 2024-04-21 MED ORDER — NITROGLYCERIN 0.4 MG SL SUBL: SUBLINGUAL_TABLET | SUBLINGUAL | 3 refills | Status: AC

## 2024-04-21 MED ORDER — AMLODIPINE BESYLATE 10 MG PO TABS: 10.0000 mg | ORAL_TABLET | Freq: Every day | ORAL | 4 refills | Status: AC

## 2024-04-21 NOTE — Patient Instructions (Signed)
 Medication Instructions:  Your physician recommends that you continue on your current medications as directed. Please refer to the Current Medication list given to you today.  *If you need a refill on your cardiac medications before your next appointment, please call your pharmacy*   Follow-Up: At [facility], you and your health needs are our priority.  As part of our continuing mission to provide you with exceptional heart care, our providers are all part of one team.  This team includes your primary Cardiologist (physician) and Advanced Practice Providers or APPs (Physician Assistants and Nurse Practitioners) who all work together to provide you with the care you need, when you need it.  Your next appointment:   We will see you on an as needed basis.   Provider:   Dorn Lesches, MD     We recommend signing up for the patient portal called MyChart.  Sign up information is provided on this After Visit Summary.  MyChart is used to connect with patients for Virtual Visits (Telemedicine).  Patients are able to view lab/test results, encounter notes, upcoming appointments, etc.  Non-urgent messages can be sent to your provider as well.   To learn more about what you can do with MyChart, go to [email or web address].   Other Instructions

## 2024-04-21 NOTE — Assessment & Plan Note (Signed)
 History of peripheral arterial disease status post peripheral angiography which I performed 11/21/2022 revealing occluded SFAs bilaterally from the origin down to the adductor canal with three-vessel runoff.  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  I began her on Pletal  which provided significant symptomatic relief.  She no longer complains of claudication but does complain of restless leg syndrome.  I did refer her to Dr. Silver who agreed that she at this point was not a surgical candidate because of lack of symptoms.

## 2024-04-21 NOTE — Progress Notes (Signed)
 04/21/2024 Elveria Medico   February 21, 1951  [id number]  Primary Physician Stephane Leita DEL, MD Primary Cardiologist: Dorn JINNY Lesches MD [place], [place]  HPI:  [name] is a 73 y.o.   hin-appearing single Caucasian female mother of 2, grandmother of 2 grandchildren who works at [employer].  She was referred to me by Barnie Hila , NP for evaluation of PAD.  Her primary cardiologist is Dr. Lavona.  I apparently took care of her many years ago when I was part of [facility].  I last saw her in the office 04/21/23.  Her risk factors include ongoing tobacco abuse of 1/2 pack/day for the last 50+ years, treated hypertension and hyperlipidemia.  She had bypass grafting x 1 in 1996 by Dr. Tanda with a LIMA to her LAD.  She had a STEMI 05/12/2017 and had PCI and drug-eluting stenting to the mid LAD just beyond LIMA insertion as well as a DES stent to the ramus intermedius branch.  Her EF is in the 40 to 50% range.  She also has left subclavian artery stenosis.  She apparently had left carotid to subclavian bypass as well.  She is complain of lifestyle-limiting claudication at less than 1 block which is fairly symmetric.  Lower extremity arterial Doppler studies performed 11/04/2022 revealed ABIs in the 0.6 range with occluded SFAs bilaterally.   I performed peripheral angiography on her 11/23/2022 revealing occluded SFAs bilaterally beginning at the origin reconstituting in the adductor canal by profunda femoris collaterals.  She had three-vessel runoff bilaterally.  I do not think she had percutaneous options for revascularization given the flush occlusion and the length of the occlusion as well.  She does continue to smoke.  We talked about starting pharmacologic therapy with Pletal  prior to entertaining surgical revascularization.     Since I saw her in the office a year ago I did refer her to Dr. Lanis for surgical evaluation.  He agreed that at this point, given her clinical  improvement on Pletal , that she was not a surgical candidate.  He also obtain a abdominal ultrasound that ruled out abdominal aortic aneurysm and carotid Dopplers that suggested that her left carotid to subclavian bypass graft had occluded.  She is asymptomatic from this.  She is currently smoking 5 cigarettes a day and does complain of shortness of breath which is most likely related to her COPD.   Current Meds  Medication Sig   albuterol  (VENTOLIN  HFA) 108 (90 Base) MCG/ACT inhaler Inhale 1-2 puffs into the lungs every 6 (six) hours as needed for wheezing or shortness of breath.   ALPRAZolam  (XANAX ) 0.25 MG tablet TAKE 1 TABLET BY MOUTH EVERY DAY (Patient taking differently: Take 0.25 mg by mouth daily as needed for anxiety.)   amLODipine  (NORVASC ) 10 MG tablet Take 1 tablet (10 mg total) by mouth daily.   aspirin  81 MG tablet Take 81 mg by mouth daily.   calcipotriene (DOVONOX) 0.005 % cream Apply 1 Application topically daily as needed (top of foot).   carvedilol  (COREG ) 3.125 MG tablet Take 1 tablet (3.125 mg total) by mouth 2 (two) times daily with a meal.   cholecalciferol  (VITAMIN D3) 25 MCG (1000 UNIT) tablet Take 1,000 Units by mouth daily.   desonide (DESOWEN) 0.05 % cream Apply 1 Application topically daily as needed (head).   diphenhydrAMINE (BENADRYL) 25 mg capsule Take 25 mg by mouth every 6 (six) hours as needed for allergies.   ezetimibe  (ZETIA ) 10 MG tablet Take  1 tablet (10 mg total) by mouth daily. For cholesterol   fexofenadine  (ALLEGRA ) 180 MG tablet Take 1 tablet (180 mg total) by mouth daily.   Fluticasone-Umeclidin-Vilant (TRELEGY ELLIPTA ) 200-62.5-25 MCG/ACT AEPB Inhale 1 puff into the lungs daily.   montelukast  (SINGULAIR ) 10 MG tablet TAKE 1 TABLET BY MOUTH EVERYDAY AT BEDTIME   Multiple Vitamins-Minerals (PRESERVISION AREDS 2) CAPS Take 1 capsule by mouth daily.   nitroGLYCERIN  (NITROSTAT ) 0.4 MG SL tablet PLACE 1 TABLET ON THE TONGUE &LET DISSOLVE EVERY 5 MINS AS  NEEDED FOR CHEST PAIN   olmesartan  (BENICAR ) 20 MG tablet TAKE 1 TABLET BY MOUTH EVERY DAY   omeprazole  (PRILOSEC) 10 MG capsule TAKE 1 CAPSULE (10 MG TOTAL) BY MOUTH DAILY AS NEEDED.   rosuvastatin  (CRESTOR ) 40 MG tablet Take 1 tablet (40 mg total) by mouth daily. NEED APPOINTMENT   vitamin B-12 (CYANOCOBALAMIN) 100 MCG tablet Take 100 mcg by mouth daily.   vitamin E 180 MG (400 UNITS) capsule Take 400 Units by mouth daily.   [DISCONTINUED] cilostazol  (PLETAL ) 50 MG tablet TAKE 1 TABLET(50 MG) BY MOUTH TWICE DAILY     Allergies  Allergen Reactions   Bisphosphonates Other (See Comments)    Osteonecrosis Jaw     Social History   Socioeconomic History   Marital status: Married    Spouse name: Not on file   Number of children: 2   Years of education: Not on file   Highest education level: Not on file  Occupational History   Not on file  Tobacco Use   Smoking status: Every Day    Current packs/day: 0.50    Average packs/day: 0.5 packs/day for 40.0 years (20.0 ttl pk-yrs)    Types: Cigarettes   Smokeless tobacco: Never  Vaping Use   Vaping status: Never Used  Substance and Sexual Activity   Alcohol use: Yes    Comment: Occasional Beer   Drug use: No   Sexual activity: Yes    Birth control/protection: Post-menopausal  Other Topics Concern   Not on file  Social History Narrative   Lives alone.     Social Drivers of Corporate Investment Banker Strain: Not on file  Food Insecurity: Not on file  Transportation Needs: Not on file  Physical Activity: Not on file  Stress: Not on file  Social Connections: Not on file  Intimate Partner Violence: Not on file     Review of Systems: General: negative for chills, fever, night sweats or weight changes.  Cardiovascular: negative for chest pain, dyspnea on exertion, edema, orthopnea, palpitations, paroxysmal nocturnal dyspnea or shortness of breath Dermatological: negative for rash Respiratory: negative for cough or  wheezing Urologic: negative for hematuria Abdominal: negative for nausea, vomiting, diarrhea, bright red blood per rectum, melena, or hematemesis Neurologic: negative for visual changes, syncope, or dizziness All other systems reviewed and are otherwise negative except as noted above.    Blood pressure 122/78, pulse 87, height 5' 7 (1.702 m), weight 99 lb 9.6 oz (45.2 kg), SpO2 95%.  General appearance: alert and no distress Neck: no adenopathy, no JVD, supple, symmetrical, trachea midline, thyroid not enlarged, symmetric, no tenderness/mass/nodules, and right carotid bruit Lungs: clear to auscultation bilaterally Heart: regular rate and rhythm, S1, S2 normal, no murmur, click, rub or gallop Extremities: extremities normal, atraumatic, no cyanosis or edema Pulses: Absent pedal pulses Skin: Skin color, texture, turgor normal. No rashes or lesions Neurologic: Grossly normal  EKG  EKG Interpretation Date/Time:  Wednesday April 21 2024 14:42:45 EST  Ventricular Rate:  85 PR Interval:  178 QRS Duration:  84 QT Interval:  330 QTC Calculation: 392 R Axis:   69  Text Interpretation: Normal sinus rhythm Possible Left atrial enlargement ST & T wave abnormality, consider inferolateral ischemia When compared with ECG of 21-Apr-2023 15:15, No significant change was found Confirmed by Court Carrier [phone]) on 04/21/2024 3:08:26 PM    ASSESSMENT AND PLAN:   Peripheral arterial disease History of peripheral arterial disease status post peripheral angiography which I performed 11/21/2022 revealing occluded SFAs bilaterally from the origin down to the adductor canal with three-vessel runoff.  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  I began her on Pletal  which provided significant symptomatic relief.  She no longer complains of claudication but does complain of restless leg syndrome.  I did refer her to Dr. Silver who agreed that she at this point was not a surgical  candidate because of lack of symptoms.     Carrier DOROTHA Court MD FACP,FACC,FAHA, [facility] 04/21/2024 3:08 PM

## 2024-04-30 ENCOUNTER — Other Ambulatory Visit (HOSPITAL_COMMUNITY): Payer: Self-pay | Admitting: Internal Medicine

## 2024-05-03 ENCOUNTER — Telehealth (HOSPITAL_COMMUNITY): Payer: Self-pay

## 2024-05-03 NOTE — Telephone Encounter (Signed)
 Auth Submission: APPROVED Site of care: Site of care: [facility] Payer: [employer] Medicare Medication & CPT/J Code(s) submitted: Reclast (Zolendronic acid) [id number] Diagnosis Code: M81.0 Route of submission (phone, fax, portal): portal Phone # Fax # Auth type: Buy/Bill HB Units/visits requested: 5mg  x 1 dose Reference number: [id number] Approval from: 05/03/24 to 05/03/25    Auth Submission: NO AUTH NEEDED Site of care: Site of care: [facility] Payer: [facility] Commercial Medication & CPT/J Code(s) submitted: Reclast (Zolendronic acid) [id number] Diagnosis Code: M81.0 Route of submission (phone, fax, portal): portal Phone # Fax # Auth type: Buy/Bill HB Units/visits requested: 5mg  x 1 dose Reference number:  Approval from: 05/03/24 to 06/16/24

## 2024-05-05 ENCOUNTER — Ambulatory Visit: Admitting: Pulmonary Disease

## 2024-05-05 ENCOUNTER — Encounter: Payer: Self-pay | Admitting: Pulmonary Disease

## 2024-05-05 DIAGNOSIS — J449 Chronic obstructive pulmonary disease, unspecified: Secondary | ICD-10-CM | POA: Diagnosis not present

## 2024-05-05 DIAGNOSIS — F17210 Nicotine dependence, cigarettes, uncomplicated: Secondary | ICD-10-CM | POA: Diagnosis not present

## 2024-05-05 MED ORDER — MONTELUKAST SODIUM 10 MG PO TABS: 10.0000 mg | ORAL_TABLET | Freq: Every day | ORAL | 3 refills | Status: AC

## 2024-05-05 MED ORDER — ALBUTEROL SULFATE HFA 108 (90 BASE) MCG/ACT IN AERS: 1.0000 | INHALATION_SPRAY | Freq: Four times a day (QID) | RESPIRATORY_TRACT | 2 refills | Status: AC | PRN

## 2024-05-05 NOTE — Patient Instructions (Addendum)
 Continue Trelegy 1 puff daily - rinse your mouth out after each use  Use albuterol  inhaler as needed 1-2 puffs every 4-6 hours as needed  Continue supplemental oxygen at bedtime  Follow up in 6 months, call sooner if needed

## 2024-05-05 NOTE — Progress Notes (Signed)
 Established Patient Pulmonology Office Visit   Subjective:  Patient ID: [name], female    DOB: 1951-02-04  MRN: [id number]  CC:  Chief Complaint  Patient presents with   Medical Management of Chronic Issues    Pt states allergies     Discussed the use of AI scribe software for clinical note transcription with the patient, who gave verbal consent to proceed.  History of Present Illness [name] is a 73 year old female with COPD who returns for a pulmonary follow-up.  She uses Trelegy Ellipta  daily and albuterol  as needed, primarily when she forgets her daily inhaler. She uses supplemental oxygen at night and does not experience significant desaturation while walking.  She smokes five cigarettes daily, a reduction she is satisfied with, and notes improved breathing. She takes montelukast  at bedtime.      ROS    Current Outpatient Medications:    albuterol  (VENTOLIN  HFA) 108 (90 Base) MCG/ACT inhaler, Inhale 1-2 puffs into the lungs every 6 (six) hours as needed for wheezing or shortness of breath., Disp: 18 g, Rfl: 2   ALPRAZolam  (XANAX ) 0.25 MG tablet, TAKE 1 TABLET BY MOUTH EVERY DAY (Patient taking differently: Take 0.25 mg by mouth daily as needed for anxiety.), Disp: 30 tablet, Rfl: 0   amLODipine  (NORVASC ) 10 MG tablet, Take 1 tablet (10 mg total) by mouth daily., Disp: 90 tablet, Rfl: 4   aspirin  81 MG tablet, Take 81 mg by mouth daily., Disp: , Rfl:    calcipotriene (DOVONOX) 0.005 % cream, Apply 1 Application topically daily as needed (top of foot)., Disp: , Rfl:    carvedilol  (COREG ) 3.125 MG tablet, Take 1 tablet (3.125 mg total) by mouth 2 (two) times daily with a meal., Disp: 180 tablet, Rfl: 4   cholecalciferol  (VITAMIN D3) 25 MCG (1000 UNIT) tablet, Take 1,000 Units by mouth daily., Disp: , Rfl:    cilostazol  (PLETAL ) 50 MG tablet, Take 1 tablet (50 mg total) by mouth 2 (two) times daily., Disp: 180 tablet, Rfl: 4   desonide (DESOWEN) 0.05 % cream,  Apply 1 Application topically daily as needed (head)., Disp: , Rfl:    diphenhydrAMINE (BENADRYL) 25 mg capsule, Take 25 mg by mouth every 6 (six) hours as needed for allergies., Disp: , Rfl:    ezetimibe  (ZETIA ) 10 MG tablet, Take 1 tablet (10 mg total) by mouth daily. For cholesterol, Disp: 90 tablet, Rfl: 4   fexofenadine  (ALLEGRA ) 180 MG tablet, Take 1 tablet (180 mg total) by mouth daily., Disp: 30 tablet, Rfl: 5   Fluticasone-Umeclidin-Vilant (TRELEGY ELLIPTA ) 200-62.5-25 MCG/ACT AEPB, Inhale 1 puff into the lungs daily., Disp: 28 each, Rfl: 11   montelukast  (SINGULAIR ) 10 MG tablet, Take 1 tablet (10 mg total) by mouth at bedtime., Disp: 90 tablet, Rfl: 3   Multiple Vitamins-Minerals (PRESERVISION AREDS 2) CAPS, Take 1 capsule by mouth daily., Disp: , Rfl:    nitroGLYCERIN  (NITROSTAT ) 0.4 MG SL tablet, PLACE 1 TABLET ON THE TONGUE &LET DISSOLVE EVERY 5 MINS AS NEEDED FOR CHEST PAIN, Disp: 25 tablet, Rfl: 3   olmesartan  (BENICAR ) 20 MG tablet, Take 1 tablet (20 mg total) by mouth daily., Disp: 90 tablet, Rfl: 4   omeprazole  (PRILOSEC) 10 MG capsule, TAKE 1 CAPSULE (10 MG TOTAL) BY MOUTH DAILY AS NEEDED., Disp: 90 capsule, Rfl: 1   rosuvastatin  (CRESTOR ) 40 MG tablet, Take 1 tablet (40 mg total) by mouth daily., Disp: 90 tablet, Rfl: 4   vitamin B-12 (CYANOCOBALAMIN) 100 MCG tablet, Take  100 mcg by mouth daily., Disp: , Rfl:    vitamin E 180 MG (400 UNITS) capsule, Take 400 Units by mouth daily., Disp: , Rfl:       Objective:  BP (!) 154/79   Pulse 76   Ht 5' 6 (1.676 m) Comment: per pt  Wt 105 lb 6.4 oz (47.8 kg)   LMP  (LMP Unknown)   SpO2 96%   BMI 17.01 kg/m     Physical Exam Constitutional:      General: She is not in acute distress.    Appearance: Normal appearance.  Eyes:     General: No scleral icterus.    Conjunctiva/sclera: Conjunctivae normal.  Cardiovascular:     Rate and Rhythm: Normal rate and regular rhythm.  Pulmonary:     Breath sounds: No wheezing, rhonchi  or rales.  Musculoskeletal:     Right lower leg: No edema.     Left lower leg: No edema.  Skin:    General: Skin is warm and dry.  Neurological:     General: No focal deficit present.      Diagnostic Review:       Assessment & Plan:   Assessment & Plan Chronic obstructive pulmonary disease, unspecified COPD type (HCC)  Orders:   montelukast  (SINGULAIR ) 10 MG tablet; Take 1 tablet (10 mg total) by mouth at bedtime.   albuterol  (VENTOLIN  HFA) 108 (90 Base) MCG/ACT inhaler; Inhale 1-2 puffs into the lungs every 6 (six) hours as needed for wheezing or shortness of breath.   Assessment and Plan Assessment & Plan Chronic obstructive pulmonary disease (COPD) - Continue Trelegy Ellipta  one puff daily. - Use albuterol  inhaler as needed. - Use supplemental oxygen at night as prescribed. - Refilled montelukast  (Singulair ) prescription. - Refilled albuterol  inhaler prescription.  Tobacco use disorder Currently smoking five cigarettes per day, reduced from previous levels.      Return in about 6 months (around 11/02/2024) for f/u visit [name].   Dorn KATHEE Kara, MD

## 2024-05-05 NOTE — Assessment & Plan Note (Addendum)
  Orders:   montelukast  (SINGULAIR ) 10 MG tablet; Take 1 tablet (10 mg total) by mouth at bedtime.   albuterol  (VENTOLIN  HFA) 108 (90 Base) MCG/ACT inhaler; Inhale 1-2 puffs into the lungs every 6 (six) hours as needed for wheezing or shortness of breath.

## 2024-05-19 ENCOUNTER — Encounter (HOSPITAL_COMMUNITY)

## 2024-06-01 ENCOUNTER — Ambulatory Visit: Admitting: Family Medicine

## 2024-06-01 VITALS — BP 110/78 | HR 93 | Ht 66.0 in | Wt 103.0 lb

## 2024-06-01 DIAGNOSIS — M810 Age-related osteoporosis without current pathological fracture: Secondary | ICD-10-CM

## 2024-06-01 DIAGNOSIS — I130 Hypertensive heart and chronic kidney disease with heart failure and stage 1 through stage 4 chronic kidney disease, or unspecified chronic kidney disease: Secondary | ICD-10-CM | POA: Insufficient documentation

## 2024-06-01 DIAGNOSIS — H3530 Unspecified macular degeneration: Secondary | ICD-10-CM | POA: Insufficient documentation

## 2024-06-01 DIAGNOSIS — N1832 Chronic kidney disease, stage 3b: Secondary | ICD-10-CM | POA: Insufficient documentation

## 2024-06-01 DIAGNOSIS — N1831 Chronic kidney disease, stage 3a: Secondary | ICD-10-CM | POA: Insufficient documentation

## 2024-06-01 DIAGNOSIS — I25709 Atherosclerosis of coronary artery bypass graft(s), unspecified, with unspecified angina pectoris: Secondary | ICD-10-CM | POA: Insufficient documentation

## 2024-06-01 DIAGNOSIS — G609 Hereditary and idiopathic neuropathy, unspecified: Secondary | ICD-10-CM | POA: Insufficient documentation

## 2024-06-01 DIAGNOSIS — F419 Anxiety disorder, unspecified: Secondary | ICD-10-CM | POA: Insufficient documentation

## 2024-06-01 DIAGNOSIS — I5032 Chronic diastolic (congestive) heart failure: Secondary | ICD-10-CM | POA: Insufficient documentation

## 2024-06-01 DIAGNOSIS — I714 Abdominal aortic aneurysm, without rupture, unspecified: Secondary | ICD-10-CM | POA: Insufficient documentation

## 2024-06-01 NOTE — Progress Notes (Unsigned)
° °  [name], PhD, LAT, ATC acting as a scribe for Artist Lloyd, MD.  [name] is a 73 y.o. female who presents to [facility] at [facility] today for osteoporosis management.  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 Gastric/Peptic Ulcer: no Gastric bypass surgery: no Severe GERD: yes Hx of seizures: no Age at Menopause: late 40's-early 50's Calcium  intake: none Vitamin D  intake: yes Hormone replacement therapy: no Smoking history: current smoker, 1/2 pack/day Alcohol: rarely Exercise: stationary bike Major dental work in past year: 2020-- tooth extraction w/ infection, osteonecrosis of the jaw Parents with hip/spine fracture: yes- mom hip fx Height loss: none  Pertinent review of systems: No fevers or chills  Relevant historical information: 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 Current active smoker. History of left humeral head fracture.  Exam:  BP 110/78   Pulse 93   Ht 5' 6 (1.676 m)   Wt 103 lb (46.7 kg)   LMP  (LMP Unknown)   SpO2 (!) 77%   BMI 16.62 kg/m  General: Well Developed, well nourished, and in no acute distress.   MSK: L-spine normal lumbar motion normal gait.    Lab and Radiology Results No results found for this or any previous visit (from the past 72 hours). No results found.     Assessment and Plan: 73 y.o. female with osteopenia to osteoporosis.  Patient has T-scores consistent with osteopenia on recent bone density test.  She does have a history of a humeral head fracture as a result of a fall.  This in my opinion is a fragility fracture which would put her into osteoporosis. Treatment for her is going to be challenging.   She has a history of osteonecrosis of the jaw secondary to oral bisphosphonates.  There are not great medication options for her that will not include the risk of osteonecrosis of the jaw.  Prolia also carries a risk of osteonecrosis of the jaw as well as infusion bisphosphonates.  She is not ready to consider that risk.  We discussed options.  Plan to maximize conservative management with home exercise program as well as joining a gym and hired a systems analyst to tax inspector.  She is willing to do this.  Looking at recent labs vitamin D  was checked relatively recently was in the acceptable range of 40.  Plan to continue vitamin D .  Recheck in 1 year.   PDMP not reviewed this encounter. No orders of the defined types were placed in this encounter.  No orders of the defined types were placed in this encounter.    Discussed warning signs or symptoms. Please see discharge instructions. Patient expresses understanding.   The above documentation has been reviewed and is accurate and complete Artist Lloyd, M.D.

## 2024-06-01 NOTE — Patient Instructions (Signed)
 Thank you for coming in today.   I do recommend that you join the gym and hire a systems analyst and work on emergency planning/management officer and weight lifting for osteoporosis.   Continue current Vitamin D  dose.   Recheck in 1 year.

## 2024-06-22 NOTE — Progress Notes (Unsigned)
 " Cardiology Office Note:   Date:  06/23/2024  ID:  [name], DOB 10/18/1950, MRN [id number] PCP: Stephane Leita DEL, MD  [facility] Providers Cardiologist:  Lynwood Schilling, MD [facility] Cardiologist:  Dorn Lesches, MD {  History of Present Illness:   [name] is a 74 y.o. female  who presents ffor evaluation of known coronary disease. She has a history of CABG in 1996. 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   I sent her for a POET (Plain Old Exercise Treadmill) 2016,  This was negative for any evidence of ischemia. However, she had a hypertensive blood pressure response.   She presented 05/12/17 with an anterior STEMI after she developed chest pain at work and called EMS. Cath revealed high grade LAD disease after the LIMA insertion and proximal RI disease. She underwent PCI with DES to both sites. Troponin peaked at 2.76.  I last saw her in 2022.    She has been followed by Dr. Lesches.  She had claudication and had peripheral angiography on her 11/23/2022 revealing occluded SFAs bilaterally beginning at the origin reconstituting in the adductor canal by profunda femoris collaterals. She had three-vessel runoff bilaterally.  She was managed with Pletal  as she did not have a percutaneous revasc option.    She presents for evaluation of SOB.  She does see pulmonary and has O2 at night to be used.  She is managed for COPD.  She still smoking about 4 cigarettes a day.  She has had progressive shortness of breath.  She says she is short of breath walking a short distance on level ground.  She is not really describing PND or orthopnea.  She is not having any new palpitations, presyncope or syncope.  She has had no chest pain or symptoms reminiscent of her previous angina.  She has not had to take any nitroglycerin .  She is limited by some dizziness that she gets.  She gets a little lightheaded.  She thought she was told she had some valvular heart disease but she  has no history of this.  CT done recently did demonstrate some aortic atherosclerosis.   ROS: As stated in the HPI and negative for all other systems.  Studies Reviewed:    EKG:       04/21/2024 sinus rhythm, rate 85, right atrial enlargement, left ventricular hypertrophy, possible left atrial enlargement, no acute ST-T wave changes, nonspecific inferolateral changes unchanged from previous.  Risk Assessment/Calculations:              Physical Exam:   VS:  BP (!) 91/59 (BP Location: Left Arm, Patient Position: Sitting, Cuff Size: Normal)   Pulse 75   Ht 5' 6 (1.676 m)   Wt 102 lb 12.8 oz (46.6 kg)   LMP  (LMP Unknown)   SpO2 96%   BMI 16.59 kg/m    Wt Readings from Last 3 Encounters:  06/23/24 102 lb 12.8 oz (46.6 kg)  06/01/24 103 lb (46.7 kg)  05/05/24 105 lb 6.4 oz (47.8 kg)     GEN: Well nourished, well developed in no acute distress NECK: No JVD; No carotid bruits CARDIAC: RRR, no murmurs, rubs, gallops RESPIRATORY:  Clear to auscultation without rales, wheezing or rhonchi  ABDOMEN: Soft, non-tender, non-distended EXTREMITIES:  No edema; No deformity   ASSESSMENT AND PLAN:   CAD:      The patient does have progressive dyspnea and I suspect this is related to  her chronic lung disease but I am going to start with evaluation below and have a low threshold for perfusion imaging if this test is unremarkable.    TOBACCO:      We again talked about this and she understands the need to quit smoking.   DYSLIPIDEMIA:    LDL was 41 and an HDL of 46 earlier in 2025.  She will continue the meds as listed.   CARDIOMYOPATHY:  This previously had been 40 - 45% but was normal on follow up echo in 2024.  Because of her progressive dyspnea I am going to check a BNP level and an echocardiogram.  Of note walking around the office she was short of breath but her sats went from only 97% to 94%.  I will have a low threshold for perfusion imaging.   PVD: The patient has anatomy as described  above and is going to be managed conservatively.   HTN:    Her blood pressure runs low.  She will continue the meds as listed.     SOLITARY KIDNEY:    Creat was most recently 1.12.  No change in therapy.  Follow up with me after the echo and labs.  Signed, Lynwood Schilling, MD   "

## 2024-06-23 ENCOUNTER — Encounter: Payer: Self-pay | Admitting: Cardiology

## 2024-06-23 ENCOUNTER — Ambulatory Visit: Attending: Cardiovascular Disease | Admitting: Cardiology

## 2024-06-23 VITALS — BP 91/59 | HR 75 | Ht 66.0 in | Wt 102.8 lb

## 2024-06-23 DIAGNOSIS — I10 Essential (primary) hypertension: Secondary | ICD-10-CM

## 2024-06-23 DIAGNOSIS — R0602 Shortness of breath: Secondary | ICD-10-CM | POA: Diagnosis not present

## 2024-06-23 DIAGNOSIS — I2510 Atherosclerotic heart disease of native coronary artery without angina pectoris: Secondary | ICD-10-CM | POA: Diagnosis not present

## 2024-06-23 DIAGNOSIS — E785 Hyperlipidemia, unspecified: Secondary | ICD-10-CM

## 2024-06-23 DIAGNOSIS — I739 Peripheral vascular disease, unspecified: Secondary | ICD-10-CM

## 2024-06-23 NOTE — Patient Instructions (Signed)
 Medication Instructions:  Your physician recommends that you continue on your current medications as directed. Please refer to the Current Medication list given to you today.  *If you need a refill on your cardiac medications before your next appointment, please call your pharmacy*  Lab Work: BNP today at [facility] If you have labs (blood work) drawn today and your tests are completely normal, you will receive your results only by: MyChart Message (if you have MyChart) OR A paper copy in the mail If you have any lab test that is abnormal or we need to change your treatment, we will call you to review the results.  Testing/Procedures: Echocardiogram Your physician has requested that you have an echocardiogram. Echocardiography is a painless test that uses sound waves to create images of your heart. It provides your doctor with information about the size and shape of your heart and how well your hearts chambers and valves are working. This procedure takes approximately one hour. There are no restrictions for this procedure. Please do NOT wear cologne, perfume, aftershave, or lotions (deodorant is allowed). Please arrive 15 minutes prior to your appointment time.  Please note: We ask at that you not bring children with you during ultrasound (echo/ vascular) testing. Due to room size and safety concerns, children are not allowed in the ultrasound rooms during exams. Our front office staff cannot provide observation of children in our lobby area while testing is being conducted. An adult accompanying a patient to their appointment will only be allowed in the ultrasound room at the discretion of the ultrasound technician under special circumstances. We apologize for any inconvenience.   Follow-Up: At [facility], you and your health needs are our priority.  As part of our continuing mission to provide you with exceptional heart care, our providers are all part of one team.  This team  includes your primary Cardiologist (physician) and Advanced Practice Providers or APPs (Physician Assistants and Nurse Practitioners) who all work together to provide you with the care you need, when you need it.  Your next appointment:   After echo  Provider:   Lavona, MD   We recommend signing up for the patient portal called MyChart.  Sign up information is provided on this After Visit Summary.  MyChart is used to connect with patients for Virtual Visits (Telemedicine).  Patients are able to view lab/test results, encounter notes, upcoming appointments, etc.  Non-urgent messages can be sent to your provider as well.   To learn more about what you can do with MyChart, go to [email or web address].

## 2024-06-24 ENCOUNTER — Ambulatory Visit: Payer: Self-pay | Admitting: Cardiology

## 2024-06-24 LAB — PRO B NATRIURETIC PEPTIDE: NT-Pro BNP: 261 pg/mL (ref 0–301)

## 2024-07-13 ENCOUNTER — Inpatient Hospital Stay (HOSPITAL_COMMUNITY)
Admission: EM | Admit: 2024-07-13 | Discharge: 2024-07-15 | DRG: 312 | Disposition: A | Discharge status: Home Health | Attending: Internal Medicine | Admitting: Internal Medicine

## 2024-07-13 ENCOUNTER — Other Ambulatory Visit: Payer: Self-pay

## 2024-07-13 ENCOUNTER — Emergency Department (HOSPITAL_COMMUNITY)

## 2024-07-13 DIAGNOSIS — I5032 Chronic diastolic (congestive) heart failure: Secondary | ICD-10-CM | POA: Diagnosis present

## 2024-07-13 DIAGNOSIS — I129 Hypertensive chronic kidney disease with stage 1 through stage 4 chronic kidney disease, or unspecified chronic kidney disease: Secondary | ICD-10-CM | POA: Diagnosis present

## 2024-07-13 DIAGNOSIS — Z9861 Coronary angioplasty status: Secondary | ICD-10-CM | POA: Diagnosis not present

## 2024-07-13 DIAGNOSIS — Z7902 Long term (current) use of antithrombotics/antiplatelets: Secondary | ICD-10-CM

## 2024-07-13 DIAGNOSIS — K219 Gastro-esophageal reflux disease without esophagitis: Secondary | ICD-10-CM | POA: Diagnosis present

## 2024-07-13 DIAGNOSIS — Z79899 Other long term (current) drug therapy: Secondary | ICD-10-CM

## 2024-07-13 DIAGNOSIS — I739 Peripheral vascular disease, unspecified: Secondary | ICD-10-CM | POA: Diagnosis not present

## 2024-07-13 DIAGNOSIS — Z7952 Long term (current) use of systemic steroids: Secondary | ICD-10-CM

## 2024-07-13 DIAGNOSIS — I2510 Atherosclerotic heart disease of native coronary artery without angina pectoris: Secondary | ICD-10-CM | POA: Diagnosis not present

## 2024-07-13 DIAGNOSIS — Z955 Presence of coronary angioplasty implant and graft: Secondary | ICD-10-CM

## 2024-07-13 DIAGNOSIS — R55 Syncope and collapse: Principal | ICD-10-CM | POA: Diagnosis present

## 2024-07-13 DIAGNOSIS — M25561 Pain in right knee: Secondary | ICD-10-CM | POA: Diagnosis present

## 2024-07-13 DIAGNOSIS — Z825 Family history of asthma and other chronic lower respiratory diseases: Secondary | ICD-10-CM

## 2024-07-13 DIAGNOSIS — E785 Hyperlipidemia, unspecified: Secondary | ICD-10-CM | POA: Diagnosis present

## 2024-07-13 DIAGNOSIS — Z818 Family history of other mental and behavioral disorders: Secondary | ICD-10-CM

## 2024-07-13 DIAGNOSIS — R600 Localized edema: Secondary | ICD-10-CM

## 2024-07-13 DIAGNOSIS — E43 Unspecified severe protein-calorie malnutrition: Secondary | ICD-10-CM | POA: Diagnosis present

## 2024-07-13 DIAGNOSIS — R54 Age-related physical debility: Secondary | ICD-10-CM | POA: Diagnosis present

## 2024-07-13 DIAGNOSIS — G8929 Other chronic pain: Secondary | ICD-10-CM | POA: Diagnosis present

## 2024-07-13 DIAGNOSIS — R64 Cachexia: Secondary | ICD-10-CM | POA: Diagnosis present

## 2024-07-13 DIAGNOSIS — Z888 Allergy status to other drugs, medicaments and biological substances status: Secondary | ICD-10-CM

## 2024-07-13 DIAGNOSIS — Z96612 Presence of left artificial shoulder joint: Secondary | ICD-10-CM | POA: Diagnosis present

## 2024-07-13 DIAGNOSIS — Z87442 Personal history of urinary calculi: Secondary | ICD-10-CM

## 2024-07-13 DIAGNOSIS — Z8049 Family history of malignant neoplasm of other genital organs: Secondary | ICD-10-CM

## 2024-07-13 DIAGNOSIS — Z905 Acquired absence of kidney: Secondary | ICD-10-CM

## 2024-07-13 DIAGNOSIS — E871 Hypo-osmolality and hyponatremia: Secondary | ICD-10-CM | POA: Diagnosis present

## 2024-07-13 DIAGNOSIS — I252 Old myocardial infarction: Secondary | ICD-10-CM

## 2024-07-13 DIAGNOSIS — J449 Chronic obstructive pulmonary disease, unspecified: Secondary | ICD-10-CM | POA: Diagnosis present

## 2024-07-13 DIAGNOSIS — R0602 Shortness of breath: Secondary | ICD-10-CM

## 2024-07-13 DIAGNOSIS — N1831 Chronic kidney disease, stage 3a: Secondary | ICD-10-CM | POA: Diagnosis present

## 2024-07-13 DIAGNOSIS — Z951 Presence of aortocoronary bypass graft: Secondary | ICD-10-CM

## 2024-07-13 DIAGNOSIS — Z7982 Long term (current) use of aspirin: Secondary | ICD-10-CM

## 2024-07-13 DIAGNOSIS — J439 Emphysema, unspecified: Secondary | ICD-10-CM | POA: Diagnosis not present

## 2024-07-13 DIAGNOSIS — Z681 Body mass index (BMI) 19 or less, adult: Secondary | ICD-10-CM

## 2024-07-13 DIAGNOSIS — M25562 Pain in left knee: Secondary | ICD-10-CM | POA: Diagnosis present

## 2024-07-13 DIAGNOSIS — F419 Anxiety disorder, unspecified: Secondary | ICD-10-CM | POA: Diagnosis present

## 2024-07-13 DIAGNOSIS — F17210 Nicotine dependence, cigarettes, uncomplicated: Secondary | ICD-10-CM | POA: Diagnosis present

## 2024-07-13 LAB — CBC
HCT: 44.6 % (ref 36.0–46.0)
Hemoglobin: 14.3 g/dL (ref 12.0–15.0)
MCH: 30.1 pg (ref 26.0–34.0)
MCHC: 32.1 g/dL (ref 30.0–36.0)
MCV: 93.9 fL (ref 80.0–100.0)
Platelets: 248 10*3/uL (ref 150–400)
RBC: 4.75 MIL/uL (ref 3.87–5.11)
RDW: 15.4 % (ref 11.5–15.5)
WBC: 6.4 10*3/uL (ref 4.0–10.5)
nRBC: 0 % (ref 0.0–0.2)

## 2024-07-13 LAB — BASIC METABOLIC PANEL WITH GFR
Anion gap: 7 (ref 5–15)
BUN: 23 mg/dL (ref 8–23)
CO2: 28 mmol/L (ref 22–32)
Calcium: 10.2 mg/dL (ref 8.9–10.3)
Chloride: 106 mmol/L (ref 98–111)
Creatinine, Ser: 1.07 mg/dL — ABNORMAL HIGH (ref 0.44–1.00)
GFR, Estimated: 55 mL/min — ABNORMAL LOW
Glucose, Bld: 129 mg/dL — ABNORMAL HIGH (ref 70–99)
Potassium: 4.5 mmol/L (ref 3.5–5.1)
Sodium: 141 mmol/L (ref 135–145)

## 2024-07-13 LAB — PRO BRAIN NATRIURETIC PEPTIDE: Pro Brain Natriuretic Peptide: 299 pg/mL

## 2024-07-13 LAB — TROPONIN T, HIGH SENSITIVITY
Troponin T High Sensitivity: 11 ng/L (ref 0–19)
Troponin T High Sensitivity: 11 ng/L (ref 0–19)

## 2024-07-13 LAB — MAGNESIUM: Magnesium: 2.5 mg/dL — ABNORMAL HIGH (ref 1.7–2.4)

## 2024-07-13 LAB — TSH: TSH: 1.07 u[IU]/mL (ref 0.350–4.500)

## 2024-07-13 NOTE — ED Provider Triage Note (Signed)
 Emergency Medicine Provider Triage Evaluation Note  [name] , a 74 y.o. female  was evaluated in triage.  Pt complains of near syncope, exertional shortness of breath, generalized weakness and fatigue.  Review of Systems  Positive: Generalized weakness, fatigue, bilateral leg pain with ambulation that is chronic, worsening exertional shortness of breath and near syncope.  Acutely worsened over the last 4 days Negative: No active chest pain, nausea, vomiting, constipation, diarrhea, or urinary changes.   Physical Exam  BP (!) 117/57   Pulse 69   Temp 97.9 F (36.6 C) (Oral)   Resp 20   Ht 5' 6 (1.676 m)   Wt 47.6 kg   LMP  (LMP Unknown)   SpO2 100%   BMI 16.95 kg/m  Gen:   Awake, no distress   Resp:  Normal effort at rest, some rales on exam MSK:   Moves extremities without difficulty  Other:    Medical Decision Making  Medically screening exam initiated at 6:05 PM.  Appropriate orders placed.  Latora Quarry was informed that the remainder of the evaluation will be completed by another provider, this initial triage assessment does not replace that evaluation, and the importance of remaining in the ED until their evaluation is complete.  [name] is a 75 y.o. female with a past medical history significant for CAD status post PCI and CABG, peripheral vascular disease, COPD, solitary kidney, hypertension, previous kidney stones, and previous hernia repair who presents with worsening exertional shortness of breath and episodes of ectopy and lightheadedness.  According to patient, she is currently in the midst of getting a workup with her vascular team and cardiology team given these exertional shortness of breath and lightheaded episodes.  She reports with any exertion she is getting winded.  She is denying chest pain at this time but has been feeling worse.  The last 4 days she has been scared to even walk around because she is getting so winded and lightheaded.  She reports  she was in the shower and nearly had a syncopal episode.  This is much worse in the last several days.  She reports some cramping with walking in her legs but this is not new.  She otherwise denies any fevers, chills, congestion, cough, nausea, vomiting, constipation, diarrhea, or urinary changes.  Patient reports she is post to have further workup done  in a week and a half with her cardiology team but could not wait that long due to her near syncopal episodes in the shower today and recently.  On exam, lungs had some rales but chest was nontender.  There was a slight murmur.  Abdomen nontender.  Intact pulses in upper extremities.  Legs were nontender and not critically edematous for me.  There was slight indentation of her socks on to her legs but they were not grossly swollen.  Patient otherwise resting and vital signs reassuring initially.  EKG did not show STEMI.  Given the patient's 4 days of acute worsening exertional shortness of breath lightheadedness and near syncopal episodes, anticipate workup to rule out concerning etiologies.  Initial orders were placed and anticipate her being evaluated by provider to determine disposition after workup.    Annikah Lovins, Lonni PARAS, MD 07/13/24 [phone]

## 2024-07-13 NOTE — ED Notes (Signed)
 CCMD called.

## 2024-07-13 NOTE — ED Provider Notes (Signed)
 " [facility] Provider Note   CSN: [id number] Arrival date & time: 07/13/24  1710     Patient presents with: Shortness of Breath   [name] is a 74 y.o. female.   The history is provided by the patient and medical records. No language interpreter was used.  Shortness of Breath Severity:  Severe Onset quality:  Gradual Duration:  4 days Timing:  Constant Progression:  Waxing and waning Context: not URI   Relieved by:  Rest Worsened by:  Exertion Ineffective treatments:  None tried Associated symptoms: cough   Associated symptoms: no abdominal pain, no chest pain, no fever, no neck pain, no rash, no vomiting and no wheezing   Risk factors: no hx of PE/DVT        Prior to Admission medications  Medication Sig Start Date End Date Taking? Authorizing Provider  albuterol  (VENTOLIN  HFA) 108 (90 Base) MCG/ACT inhaler Inhale 1-2 puffs into the lungs every 6 (six) hours as needed for wheezing or shortness of breath. 05/05/24   Kara Dorn NOVAK, MD  ALPRAZolam  (XANAX ) 0.25 MG tablet TAKE 1 TABLET BY MOUTH EVERY DAY Patient taking differently: Take 0.25 mg by mouth daily as needed for anxiety. 09/18/20   Duanne Butler DASEN, MD  amLODipine  (NORVASC ) 10 MG tablet Take 1 tablet (10 mg total) by mouth daily. 04/21/24   Lavona Agent, MD  aspirin  81 MG tablet Take 81 mg by mouth daily.    [provider]  carvedilol  (COREG ) 3.125 MG tablet Take 1 tablet (3.125 mg total) by mouth 2 (two) times daily with a meal. 04/21/24   Lavona Agent, MD  cilostazol  (PLETAL ) 50 MG tablet Take 1 tablet (50 mg total) by mouth 2 (two) times daily. 04/21/24   Court Dorn PARAS, MD  desonide (DESOWEN) 0.05 % cream Apply 1 Application topically daily as needed (head).    [provider]  diphenhydrAMINE (BENADRYL) 25 mg capsule Take 25 mg by mouth every 6 (six) hours as needed for allergies.    [provider]  ezetimibe  (ZETIA ) 10 MG  tablet Take 10 mg by mouth daily. 05/06/23   [provider]  fexofenadine  (ALLEGRA ) 180 MG tablet Take 1 tablet (180 mg total) by mouth daily. 05/28/23   Kara Dorn NOVAK, MD  Fluticasone-Umeclidin-Vilant (TRELEGY ELLIPTA ) 200-62.5-25 MCG/ACT AEPB Inhale 1 puff into the lungs daily. 12/01/23   Kara Dorn NOVAK, MD  lisinopril  (ZESTRIL ) 20 MG tablet Take 20 mg by mouth daily.    [provider]  montelukast  (SINGULAIR ) 10 MG tablet Take 1 tablet (10 mg total) by mouth at bedtime. 05/05/24   Kara Dorn NOVAK, MD  Multiple Vitamins-Minerals (PRESERVISION AREDS 2) CAPS Take 1 capsule by mouth daily.    [provider]  nitroGLYCERIN  (NITROSTAT ) 0.4 MG SL tablet PLACE 1 TABLET ON THE TONGUE &LET DISSOLVE EVERY 5 MINS AS NEEDED FOR CHEST PAIN 04/21/24   Lavona Agent, MD  olmesartan  (BENICAR ) 20 MG tablet Take 1 tablet (20 mg total) by mouth daily. 04/21/24   Lavona Agent, MD  omeprazole  (PRILOSEC) 10 MG capsule TAKE 1 CAPSULE (10 MG TOTAL) BY MOUTH DAILY AS NEEDED. 07/26/20   Chandra Harlene LABOR, NP  rosuvastatin  (CRESTOR ) 40 MG tablet Take 1 tablet (40 mg total) by mouth daily. 04/21/24   Lavona Agent, MD  vitamin B-12 (CYANOCOBALAMIN) 100 MCG tablet Take 100 mcg by mouth daily.    [provider]  vitamin E 180 MG (400 UNITS) capsule Take  400 Units by mouth daily.    [provider]    Allergies: Bisphosphonates    Review of Systems  Constitutional:  Positive for fatigue. Negative for chills and fever.  HENT:  Negative for congestion.   Respiratory:  Positive for cough, chest tightness and shortness of breath. Negative for wheezing.   Cardiovascular:  Positive for leg swelling. Negative for chest pain and palpitations.  Gastrointestinal:  Negative for abdominal pain, constipation, diarrhea, nausea and vomiting.  Genitourinary:  Negative for dysuria, flank pain and frequency.  Musculoskeletal:  Negative for back pain, neck pain and neck  stiffness.  Skin:  Negative for rash and wound.  Neurological:  Positive for weakness (genearalized) and light-headedness. Negative for seizures and syncope.  Psychiatric/Behavioral:  Negative for agitation.   All other systems reviewed and are negative.   Updated Vital Signs BP (!) 162/71   Pulse 69   Temp 98 F (36.7 C) (Oral)   Resp (!) 21   Ht 5' 6 (1.676 m)   Wt 47.6 kg   LMP  (LMP Unknown)   SpO2 98%   BMI 16.95 kg/m   Physical Exam Vitals and nursing note reviewed.  Constitutional:      General: She is not in acute distress.    Appearance: She is well-developed. She is not ill-appearing, toxic-appearing or diaphoretic.  HENT:     Head: Normocephalic and atraumatic.  Eyes:     Extraocular Movements: Extraocular movements intact.     Conjunctiva/sclera: Conjunctivae normal.     Pupils: Pupils are equal, round, and reactive to light.  Cardiovascular:     Rate and Rhythm: Normal rate and regular rhythm.     Heart sounds: Murmur heard.  Pulmonary:     Effort: Pulmonary effort is normal. Tachypnea present. No respiratory distress.     Breath sounds: Rhonchi and rales present. No wheezing.  Chest:     Chest wall: No tenderness.  Abdominal:     Palpations: Abdomen is soft.     Tenderness: There is no abdominal tenderness.  Musculoskeletal:        General: No swelling.     Cervical back: Neck supple.     Right lower leg: Edema present.     Left lower leg: Edema present.  Skin:    General: Skin is warm and dry.     Capillary Refill: Capillary refill takes less than 2 seconds.     Findings: No erythema.  Neurological:     General: No focal deficit present.     Mental Status: She is alert.  Psychiatric:        Mood and Affect: Mood normal.     (all labs ordered are listed, but only abnormal results are displayed) Labs Reviewed  BASIC METABOLIC PANEL WITH GFR - Abnormal; Notable for the following components:      Result Value   Glucose, Bld 129 (*)     Creatinine, Ser 1.07 (*)    GFR, Estimated 55 (*)    All other components within normal limits  MAGNESIUM - Abnormal; Notable for the following components:   Magnesium 2.5 (*)    All other components within normal limits  CBC  PRO BRAIN NATRIURETIC PEPTIDE  TSH  URINALYSIS, W/ REFLEX TO CULTURE (INFECTION SUSPECTED)  TROPONIN T, HIGH SENSITIVITY  TROPONIN T, HIGH SENSITIVITY    EKG: EKG Interpretation Date/Time:  Tuesday July 13 2024 17:21:58 EST Ventricular Rate:  84 PR Interval:  174 QRS Duration:  82 QT Interval:  326 QTC Calculation: 385 R Axis:   65  Text Interpretation: Normal sinus rhythm Right atrial enlargement ST & T wave abnormality, consider inferolateral ischemia Abnormal ECG When compared with ECG of 21-Apr-2024 14:42, PREVIOUS ECG IS PRESENT when compared to prior, more artifact No STEMI Confirmed by Ginger Barefoot ([id number]) on 07/13/2024 9:17:42 PM  Radiology: [facility] Chest 2 View Result Date: 07/13/2024 CLINICAL DATA:  Short of breath EXAM: CHEST - 2 VIEW COMPARISON:  02/02/2021 FINDINGS: Post sternotomy changes. Emphysema. No focal opacity or pleural effusion. Stable cardiomediastinal silhouette with aortic atherosclerosis. Left shoulder replacement IMPRESSION: No active cardiopulmonary disease. Emphysema. Electronically Signed   By: Luke Bun M.D.   On: 07/13/2024 19:25     Procedures   Medications Ordered in the ED - No data to display                                  Medical Decision Making Amount and/or Complexity of Data Reviewed Labs: ordered. Radiology: ordered.  Risk Decision regarding hospitalization.   See excerpt from my MSE note below: Marija Calamari is a 74 y.o. female with a past medical history significant for CAD status post PCI and CABG, peripheral vascular disease, COPD, solitary kidney, hypertension, previous kidney stones, and previous hernia repair who presents with worsening exertional shortness of breath and episodes of near  syncope and lightheadedness.  According to patient, she is currently in the midst of getting a workup with her vascular team and cardiology team given these exertional shortness of breath and lightheaded episodes.  She reports with any exertion she is getting winded.  She is denying chest pain at this time but has been feeling worse.  The last 4 days she has been scared to even walk around because she is getting so winded and lightheaded.  She reports she was in the shower and nearly had a syncopal episode.  This is much worse in the last several days.  She reports some cramping with walking in her legs but this is not new.  She otherwise denies any fevers, chills, congestion, cough, nausea, vomiting, constipation, diarrhea, or urinary changes.   Patient reports she is post to have further workup done  in a week and a half with her cardiology team but could not wait that long due to her near syncopal episodes in the shower today and recently.   On exam, lungs had some rales but chest was nontender.  There was a slight murmur.  Abdomen nontender.  Intact pulses in upper extremities.  Legs were nontender and not critically edematous for me.  There was slight indentation of her socks on to her legs but they were not grossly swollen.  Patient otherwise resting and vital signs reassuring initially.   EKG did not show STEMI.   Given the patient's 4 days of acute worsening exertional shortness of breath lightheadedness and near syncopal episodes, anticipate workup to rule out concerning etiologies.  Initial orders were placed and anticipate her being evaluated by provider to determine disposition after workup.   10:15 PM Patient placed in my pod to continue her workup so I will now see her as a provider.  10:58 PM I went through all the patient's workup with her.  Her labs did not show critical findings initially with negative troponin x 2, proBNP not critically elevated, and chest x-ray shows some chronic  lung disease but no clear abnormality today.  Her TSH  is normal.  CBC and CMP similar to prior.  Magnesium is not low.  Had a discussion with patient and she is still concerned.  She says that she nearly passed out today and could not breathe.  Her cardio told her to come to the hospital if this ever happened as she would likely need further workup.  She is post to have some imaging and ultrasound done in a week and a half and patient is still worried about how she felt today.  We had a shared decision-making conversation and she would prefer to be admitted given her exertional shortness of breath and near syncope and symptoms earlier today.  Will call for admission as patient may need cardiology her tomorrow to discuss that she needs ultrasound or perfusion imaging as was ordered by her cardiology team.     Final diagnoses:  Near syncope  Exertional shortness of breath  Peripheral edema    Clinical Impression: 1. Near syncope   2. Exertional shortness of breath   3. Peripheral edema     Disposition: Admit  This note was prepared with assistance of Dragon voice recognition software. Occasional wrong-word or sound-a-like substitutions may have occurred due to the inherent limitations of voice recognition software.      Aalijah Mims, Lonni PARAS, MD 07/13/24 [phone]  "

## 2024-07-13 NOTE — ED Triage Notes (Signed)
 Pt BIB [facility] for SOB and fatigue. Symptoms have been going on for the past year. Hx COPD and CHF. PRN Oxygen used at home, mostly used at night when sleeping. Pt A&O x4.

## 2024-07-14 ENCOUNTER — Observation Stay (HOSPITAL_COMMUNITY)

## 2024-07-14 ENCOUNTER — Encounter (HOSPITAL_COMMUNITY): Payer: Self-pay | Admitting: Family Medicine

## 2024-07-14 DIAGNOSIS — Z96612 Presence of left artificial shoulder joint: Secondary | ICD-10-CM | POA: Diagnosis present

## 2024-07-14 DIAGNOSIS — I129 Hypertensive chronic kidney disease with stage 1 through stage 4 chronic kidney disease, or unspecified chronic kidney disease: Secondary | ICD-10-CM | POA: Diagnosis present

## 2024-07-14 DIAGNOSIS — R64 Cachexia: Secondary | ICD-10-CM | POA: Diagnosis present

## 2024-07-14 DIAGNOSIS — I2510 Atherosclerotic heart disease of native coronary artery without angina pectoris: Secondary | ICD-10-CM | POA: Diagnosis present

## 2024-07-14 DIAGNOSIS — E871 Hypo-osmolality and hyponatremia: Secondary | ICD-10-CM | POA: Diagnosis present

## 2024-07-14 DIAGNOSIS — R600 Localized edema: Secondary | ICD-10-CM | POA: Diagnosis present

## 2024-07-14 DIAGNOSIS — R0609 Other forms of dyspnea: Secondary | ICD-10-CM | POA: Diagnosis not present

## 2024-07-14 DIAGNOSIS — Z818 Family history of other mental and behavioral disorders: Secondary | ICD-10-CM | POA: Diagnosis not present

## 2024-07-14 DIAGNOSIS — I7090 Unspecified atherosclerosis: Secondary | ICD-10-CM

## 2024-07-14 DIAGNOSIS — I252 Old myocardial infarction: Secondary | ICD-10-CM | POA: Diagnosis not present

## 2024-07-14 DIAGNOSIS — E785 Hyperlipidemia, unspecified: Secondary | ICD-10-CM | POA: Diagnosis present

## 2024-07-14 DIAGNOSIS — Z7902 Long term (current) use of antithrombotics/antiplatelets: Secondary | ICD-10-CM | POA: Diagnosis not present

## 2024-07-14 DIAGNOSIS — Z7982 Long term (current) use of aspirin: Secondary | ICD-10-CM | POA: Diagnosis not present

## 2024-07-14 DIAGNOSIS — R54 Age-related physical debility: Secondary | ICD-10-CM | POA: Diagnosis present

## 2024-07-14 DIAGNOSIS — Z681 Body mass index (BMI) 19 or less, adult: Secondary | ICD-10-CM | POA: Diagnosis not present

## 2024-07-14 DIAGNOSIS — Z951 Presence of aortocoronary bypass graft: Secondary | ICD-10-CM | POA: Diagnosis not present

## 2024-07-14 DIAGNOSIS — Z905 Acquired absence of kidney: Secondary | ICD-10-CM | POA: Diagnosis not present

## 2024-07-14 DIAGNOSIS — R55 Syncope and collapse: Secondary | ICD-10-CM | POA: Diagnosis present

## 2024-07-14 DIAGNOSIS — G8929 Other chronic pain: Secondary | ICD-10-CM | POA: Diagnosis present

## 2024-07-14 DIAGNOSIS — J449 Chronic obstructive pulmonary disease, unspecified: Secondary | ICD-10-CM | POA: Diagnosis present

## 2024-07-14 DIAGNOSIS — I739 Peripheral vascular disease, unspecified: Secondary | ICD-10-CM | POA: Diagnosis present

## 2024-07-14 DIAGNOSIS — Z825 Family history of asthma and other chronic lower respiratory diseases: Secondary | ICD-10-CM | POA: Diagnosis not present

## 2024-07-14 DIAGNOSIS — Z79899 Other long term (current) drug therapy: Secondary | ICD-10-CM | POA: Diagnosis not present

## 2024-07-14 DIAGNOSIS — E43 Unspecified severe protein-calorie malnutrition: Secondary | ICD-10-CM | POA: Diagnosis present

## 2024-07-14 DIAGNOSIS — N1831 Chronic kidney disease, stage 3a: Secondary | ICD-10-CM | POA: Diagnosis present

## 2024-07-14 DIAGNOSIS — F17210 Nicotine dependence, cigarettes, uncomplicated: Secondary | ICD-10-CM | POA: Diagnosis present

## 2024-07-14 DIAGNOSIS — Z8049 Family history of malignant neoplasm of other genital organs: Secondary | ICD-10-CM | POA: Diagnosis not present

## 2024-07-14 LAB — BASIC METABOLIC PANEL WITH GFR
Anion gap: 10 (ref 5–15)
BUN: 20 mg/dL (ref 8–23)
CO2: 26 mmol/L (ref 22–32)
Calcium: 10.3 mg/dL (ref 8.9–10.3)
Chloride: 110 mmol/L (ref 98–111)
Creatinine, Ser: 0.89 mg/dL (ref 0.44–1.00)
GFR, Estimated: >60 mL/min
Glucose, Bld: 101 mg/dL — ABNORMAL HIGH (ref 70–99)
Potassium: 4 mmol/L (ref 3.5–5.1)
Sodium: 146 mmol/L — ABNORMAL HIGH (ref 135–145)

## 2024-07-14 LAB — CBC
HCT: 42.5 % (ref 36.0–46.0)
Hemoglobin: 13.9 g/dL (ref 12.0–15.0)
MCH: 30.2 pg (ref 26.0–34.0)
MCHC: 32.7 g/dL (ref 30.0–36.0)
MCV: 92.2 fL (ref 80.0–100.0)
Platelets: 243 10*3/uL (ref 150–400)
RBC: 4.61 MIL/uL (ref 3.87–5.11)
RDW: 15.5 % (ref 11.5–15.5)
WBC: 6.5 10*3/uL (ref 4.0–10.5)
nRBC: 0 % (ref 0.0–0.2)

## 2024-07-14 LAB — ECHOCARDIOGRAM COMPLETE
Area-P 1/2: 3.31 cm2
Height: 66 in
S' Lateral: 3.9 cm
Weight: 1680 [oz_av]

## 2024-07-14 LAB — URINALYSIS, W/ REFLEX TO CULTURE (INFECTION SUSPECTED)
Bilirubin Urine: NEGATIVE
Glucose, UA: NEGATIVE mg/dL
Hgb urine dipstick: NEGATIVE
Ketones, ur: NEGATIVE mg/dL
Leukocytes,Ua: NEGATIVE
Nitrite: NEGATIVE
Protein, ur: NEGATIVE mg/dL
Specific Gravity, Urine: 1.013 (ref 1.005–1.030)
pH: 6 (ref 5.0–8.0)

## 2024-07-14 LAB — VAS US ABI WITH/WO TBI
Left ABI: 0.76
Right ABI: 0.58

## 2024-07-14 LAB — CBG MONITORING, ED: Glucose-Capillary: 96 mg/dL (ref 70–99)

## 2024-07-14 MED ORDER — ONDANSETRON HCL 4 MG PO TABS: 4.0000 mg | ORAL_TABLET | Freq: Four times a day (QID) | ORAL | Status: DC | PRN

## 2024-07-14 MED ORDER — ROSUVASTATIN CALCIUM 20 MG PO TABS
40.0000 mg | ORAL_TABLET | Freq: Every day | ORAL | Status: DC
  Administered 2024-07-14: 40 mg via ORAL
  Filled 2024-07-14: qty 2

## 2024-07-14 MED ORDER — ACETAMINOPHEN 325 MG PO TABS
650.0000 mg | ORAL_TABLET | Freq: Four times a day (QID) | ORAL | Status: DC | PRN
  Administered 2024-07-14 (×2): 650 mg via ORAL
  Filled 2024-07-14 (×2): qty 2

## 2024-07-14 MED ORDER — CARVEDILOL 3.125 MG PO TABS
3.1250 mg | ORAL_TABLET | Freq: Two times a day (BID) | ORAL | Status: DC
  Administered 2024-07-14 – 2024-07-15 (×3): 3.125 mg via ORAL
  Filled 2024-07-14 (×3): qty 1

## 2024-07-14 MED ORDER — ALPRAZOLAM 0.25 MG PO TABS: 0.2500 mg | ORAL_TABLET | Freq: Every day | ORAL | Status: DC | PRN

## 2024-07-14 MED ORDER — SENNOSIDES-DOCUSATE SODIUM 8.6-50 MG PO TABS: 1.0000 | ORAL_TABLET | Freq: Every evening | ORAL | Status: DC | PRN

## 2024-07-14 MED ORDER — ASPIRIN 81 MG PO TBEC
81.0000 mg | DELAYED_RELEASE_TABLET | Freq: Every day | ORAL | Status: DC
  Administered 2024-07-14 – 2024-07-15 (×2): 81 mg via ORAL
  Filled 2024-07-14 (×2): qty 1

## 2024-07-14 MED ORDER — ACETAMINOPHEN 650 MG RE SUPP: 650.0000 mg | Freq: Four times a day (QID) | RECTAL | Status: DC | PRN

## 2024-07-14 MED ORDER — MONTELUKAST SODIUM 10 MG PO TABS
10.0000 mg | ORAL_TABLET | Freq: Every day | ORAL | Status: DC
  Administered 2024-07-14: 10 mg via ORAL
  Filled 2024-07-14: qty 1

## 2024-07-14 MED ORDER — AMLODIPINE BESYLATE 10 MG PO TABS
10.0000 mg | ORAL_TABLET | Freq: Every day | ORAL | Status: DC
  Administered 2024-07-14 – 2024-07-15 (×2): 10 mg via ORAL
  Filled 2024-07-14: qty 2
  Filled 2024-07-14: qty 1

## 2024-07-14 MED ORDER — IPRATROPIUM-ALBUTEROL 0.5-2.5 (3) MG/3ML IN SOLN: 3.0000 mL | Freq: Four times a day (QID) | RESPIRATORY_TRACT | Status: DC | PRN

## 2024-07-14 MED ORDER — CILOSTAZOL 50 MG PO TABS
50.0000 mg | ORAL_TABLET | Freq: Two times a day (BID) | ORAL | Status: DC
  Administered 2024-07-14 – 2024-07-15 (×3): 50 mg via ORAL
  Filled 2024-07-14 (×4): qty 1

## 2024-07-14 MED ORDER — ENOXAPARIN SODIUM 40 MG/0.4ML IJ SOSY
40.0000 mg | PREFILLED_SYRINGE | INTRAMUSCULAR | Status: DC
  Administered 2024-07-14: 40 mg via SUBCUTANEOUS
  Filled 2024-07-14: qty 0.4

## 2024-07-14 MED ORDER — BUDESON-GLYCOPYRROL-FORMOTEROL 160-9-4.8 MCG/ACT IN AERO
2.0000 | INHALATION_SPRAY | Freq: Two times a day (BID) | RESPIRATORY_TRACT | Status: DC
  Administered 2024-07-14 – 2024-07-15 (×2): 2 via RESPIRATORY_TRACT
  Filled 2024-07-14: qty 5.9

## 2024-07-14 MED ORDER — SODIUM CHLORIDE 0.9% FLUSH
3.0000 mL | Freq: Two times a day (BID) | INTRAVENOUS | Status: DC
  Administered 2024-07-14 – 2024-07-15 (×4): 3 mL via INTRAVENOUS

## 2024-07-14 MED ORDER — ONDANSETRON HCL 4 MG/2ML IJ SOLN: 4.0000 mg | Freq: Four times a day (QID) | INTRAMUSCULAR | Status: DC | PRN

## 2024-07-14 NOTE — Plan of Care (Signed)

## 2024-07-14 NOTE — Hospital Course (Addendum)
 [name] is a 74 y.o. female with PMH of  hypertension, COPD, PAD, CAD status post CABG, and CKD 3A who presents for evaluation of lightheadedness and near syncope.  As per admission report she has been struggling with exertional dyspnea and occasional lightheadedness for several months at least.Symptoms have worsened over the past 4 days, she has been increasingly lightheaded with activity, and has had multiple near syncope episodes.  She had a particularly severe episode 1/27 where she thought that she would certainly lose consciousness while standing, squatted down, and then slowly began to feel better.  She denies any change in her chronic cough or chronic dyspnea, has not been experiencing any chest pain, and denies fevers, chills, abdominal pain, or leg swelling. In ED: afebrile and saturating well on RA w/ normal RR and stable BP. EKG demonstrates sinus rhythm with possible right atrial enlargement and artifact. CXR-emphysematous changes but no acute findings.Labs- creatinine 1.07, normal CBC, normal BMP, normal troponin x 2, and normal TSH.  Patient was admitted for further workup.  Seen by PT OT did fairly well, echocardiogram reviewed no acute finding. ABI completed with moderate PVD no wounds on the foot, discussed with vascular Dr Pearline who will arrange outpatient follow-up  Subjective: Seen and examined She has been ambulating herself.  She is eager to go home this morning Overnight remains afebrile vitals fairly stable tolerating her home regimen, labs unremarkable  Discharge Diagnoses:   Near syncope Deconditioning/gen weakness Chronic leg pain/PVD B/l knee pain Underweight:  VSS, no chest pain on ambulation,Orthostatic vitals as below-low positive initially but subsequently after ambulation unremarkable. Echo showed EF 60 to 65%, G1 DD RV SF is normal aortic valve is tricuspid mitral valve is normal. ABI with known PVD .She generally appears frail thin cachectic.  Suspect  weakness multifactorial in the setting of poor COPD, malnutrition, PVD Continue PT OT  Hypertension: BP stable on amlodipine  10,coreg .  COPD  Ongoing active tobacco abuse: Not in exacerbation Continue ICS-LABA-LAMA and short-acting bronchodilators as-needed.  Not hypoxic   CAD  No anginal symptoms Continue ASA, statin, and beta-blocker     PAD w/ chronic left leg pain: Last seen in march/25 by Dr Lanis Chew for b/l  LE PVD- W/ short distance claudication which improved with the use of Pletal .  VVS had a long discussion about this, most notably that blockages not to be fixed as long as she is symptom-free. Last ABIs mild to moderate disease. Repeat ABI with moderate disease Continue ASA, statin, and Pletal  and follow-up with vascular- she has no wounds on the foot, discussed with vascular Dr Pearline who will arrange outpatient follow-up.   CKD 3a:  At baseline monitor intermittently  Severe malnutrition with w/ Body mass index is 14.01 kg/m.: Consult RD, augment diet   Mobility:  PT Orders: Active PT Follow up Rec: Home Health Pt1/28/2026 0920    DVT prophylaxis: enoxaparin  (LOVENOX ) injection 40 mg Start: 07/14/24 0245 Code Status:   Code Status: Full Code Family Communication: plan of care discussed with patient at bedside. Patient status is: Remains hospitalized because of severity of illness Level of care: Telemetry   Dispo: The patient is from: home            Anticipated disposition: home /w hh Objective: Vitals last 24 hrs: Vitals:   07/15/24 0013 07/15/24 0426 07/15/24 0750 07/15/24 0816  BP: (!) 146/66 (!) 151/80  (!) 144/67  Pulse:   62 75  Resp: 16 17 18 18   Temp: 97.9  F (36.6 C) 97.6 F (36.4 C)  97.6 F (36.4 C)  TempSrc: Oral Oral  Oral  SpO2:    95%  Weight:  39.4 kg    Height:        Physical Examination: General exam: alert awake thin frail HEENT:Oral mucosa moist, Ear/Nose WNL grossly Respiratory system: Bilaterally diminished BS,no use of  accessory muscle Cardiovascular system: S1 & S2 +, No JVD. Gastrointestinal system: Abdomen soft,NT,ND, BS+ Nervous System: Alert, awake, moving all extremities,and following commands. Extremities: extremities warm, leg edema neg Skin: Warm, no rashes MSK: Normal muscle bulk,tone, power

## 2024-07-14 NOTE — ED Notes (Signed)
PT and MD at bedside

## 2024-07-14 NOTE — Progress Notes (Signed)
 " PROGRESS NOTE [name]  FMW:[id number] DOB: 1951/02/11 DOA: 07/13/2024 PCP: Stephane Leita DEL, MD  Brief Narrative/Hospital Course: [name] is a 74 y.o. female with PMH of  hypertension, COPD, PAD, CAD status post CABG, and CKD 3A who presents for evaluation of lightheadedness and near syncope.  As per admission report she has been struggling with exertional dyspnea and occasional lightheadedness for several months at least.Symptoms have worsened over the past 4 days, she has been increasingly lightheaded with activity, and has had multiple near syncope episodes.  She had a particularly severe episode 1/27 where she thought that she would certainly lose consciousness while standing, squatted down, and then slowly began to feel better.  She denies any change in her chronic cough or chronic dyspnea, has not been experiencing any chest pain, and denies fevers, chills, abdominal pain, or leg swelling. In ED: afebrile and saturating well on RA w/ normal RR and stable BP. EKG demonstrates sinus rhythm with possible right atrial enlargement and artifact. CXR-emphysematous changes but no acute findings.Labs- creatinine 1.07, normal CBC, normal BMP, normal troponin x 2, and normal TSH.  Patient was admitted for further workup  Subjective: Seen and examined today Worked w PT and has bene weak w/ ambulation, legs giving out, some dizziness, and having left leg a pain Overnight afebrile on room air BP fairly stable, Labs mild hyponatremia otherwise stable electrolytes CBC and UA Has active smoking hx and has cough  Assessment and plan:  Near syncope Deconditioning/gen weakness Chronic leg pain/PVD B/l knee pain Underweight:  VSS, no chest pain on ambulation,Orthostatic vitals as below. Ambulated with PT OT/ OV on standing down  125 from 155 but after ambualtion OV stable as below TTE pending, tele unremarkable.  Getting ABI of the lower extremities with PVD history. She generally appears frail  thin cachectic.  Continue to work with PT OT Orthostatic VS for the past 24 hrs:  BP- Lying Pulse- Lying BP- Sitting Pulse- Sitting  07/14/24 0845 148/66 (!) 43 155/64 67   Hypertension: BP appears fairly stable patient on amlodipine  10,coreg .  COPD  Ongoing active tobacco abuse: Not in exacerbation  Continue ICS-LABA-LAMA and short-acting bronchodilators as-needed.  Not hypoxic   CAD  No anginal symptoms Continue ASA, statin, and beta-blocker     PAD w/ chronic left leg pain: Last seen in march/25 by Dr Lanis Chew for b/l  LE PVD- W/ short distance claudication which improved with the use of Pletal .  VVS had a long discussion about this, most notably that blockages not to be fixed as long as she is symptom-free. Last ABIs mild to moderate disease. Unfortunately she still smokes. Continue ASA, statin, and Pletal  .  checking ABI   CKD 3a:  At baseline monitor intermittently  Severe malnutrition with w/ Body mass index is 16.95 kg/m.: Consult RD, augment diet   Mobility:  PT Orders: Active PT Follow up Rec: Home Health Pt1/28/2026 0920    DVT prophylaxis: enoxaparin  (LOVENOX ) injection 40 mg Start: 07/14/24 0245 Code Status:   Code Status: Full Code Family Communication: plan of care discussed with patient at bedside. Patient status is: Remains hospitalized because of severity of illness Level of care: Telemetry   Dispo: The patient is from: home            Anticipated disposition: TBD Objective: Vitals last 24 hrs: Vitals:   07/14/24 0715 07/14/24 0716 07/14/24 0934 07/14/24 1019  BP: 138/82 138/82  (!) 144/70  Pulse: 84 84  67  Resp: ROLLEN)  21   (!) 21  Temp:   97.6 F (36.4 C)   TempSrc:   Oral   SpO2: 95%   97%  Weight:      Height:        Physical Examination: General exam: alert awake thin frail HEENT:Oral mucosa moist, Ear/Nose WNL grossly Respiratory system: Bilaterally diminished BS,no use of accessory muscle Cardiovascular system: S1 & S2 +, No  JVD. Gastrointestinal system: Abdomen soft,NT,ND, BS+ Nervous System: Alert, awake, moving all extremities,and following commands. Extremities: extremities warm, leg edema neg Skin: Warm, no rashes MSK: Normal muscle bulk,tone, power   Medications reviewed:  Scheduled Meds:  amLODipine   10 mg Oral Daily   aspirin  EC  81 mg Oral Daily   budesonide -glycopyrrolate -formoterol   2 puff Inhalation BID   carvedilol   3.125 mg Oral BID WC   cilostazol   50 mg Oral BID   enoxaparin  (LOVENOX ) injection  40 mg Subcutaneous Q24H   montelukast   10 mg Oral QHS   rosuvastatin   40 mg Oral QHS   sodium chloride  flush  3 mL Intravenous Q12H   Continuous Infusions: Diet: Diet Order             Diet regular Room service appropriate? Yes; Fluid consistency: Thin  Diet effective now                    Unresulted Labs (From admission, onward)     Start     Ordered   07/21/24 0500  Creatinine, serum  (enoxaparin  (LOVENOX )    CrCl >/= 30 ml/min)  Weekly,   R     Comments: while on enoxaparin  therapy    07/14/24 0244   07/14/24 0500  Basic metabolic panel  Daily,   R      07/14/24 0244   07/14/24 0500  CBC  Daily,   R      07/14/24 0244           Data Reviewed: I have personally reviewed following labs and imaging studies ( see epic result tab) CBC: Recent Labs  Lab 07/13/24 1735 07/14/24 0422  WBC 6.4 6.5  HGB 14.3 13.9  HCT 44.6 42.5  MCV 93.9 92.2  PLT 248 243   CMP: Recent Labs  Lab 07/13/24 1735 07/13/24 1835 07/14/24 0422  NA 141  --  146*  K 4.5  --  4.0  CL 106  --  110  CO2 28  --  26  GLUCOSE 129*  --  101*  BUN 23  --  20  CREATININE 1.07*  --  0.89  CALCIUM  10.2  --  10.3  MG  --  2.5*  --    GFR: Estimated Creatinine Clearance: 42.9 mL/min (by C-G formula based on SCr of 0.89 mg/dL). No results for input(s): AST, ALT, ALKPHOS, BILITOT, PROT, ALBUMIN in the last 168 hours. No results for input(s): LIPASE, AMYLASE in the last 168 hours. No  results for input(s): AMMONIA in the last 168 hours. Coagulation Profile: No results for input(s): INR, PROTIME in the last 168 hours. Antimicrobials/Microbiology: Anti-infectives (From admission, onward)    None         Component Value Date/Time   SDES TISSUE LEFT MANDIBLE 03/23/2019 0809   SPECREQUEST B 03/23/2019 0809   CULT  03/23/2019 0809    FEW ESCHERICHIA COLI MODERATE ACTINOMYCES ODONTOLYTICUS Standardized susceptibility testing for this organism is not available. MIXED ANAEROBIC FLORA PRESENT.  CALL LAB IF FURTHER IID REQUIRED.    REPTSTATUS 03/28/2019  FINAL 03/23/2019 0809    Procedures:    Mennie LAMY, MD [facility] 07/14/2024, 11:05 AM   "

## 2024-07-14 NOTE — ED Notes (Signed)
 Echo at bedside

## 2024-07-14 NOTE — Progress Notes (Signed)
 Physical Therapy Quick Note  PT has completed initial evaluation.    Overall, patient at supervision assistance level.   PT Follow up recommended: Home Health PT Equipment recommended:  Rollator Complete evaluation note to follow.     Bernardino JINNY Ruth, PT, DPT Acute Rehabilitation Office [phone]

## 2024-07-14 NOTE — Evaluation (Signed)
 Physical Therapy Evaluation Patient Details Name: [name] MRN: [id number] DOB: 01/08/1951 Today's Date: 07/14/2024  History of Present Illness  74 y.o. female presents to [facility] hospital on 07/13/2024 with lightheadedness and near-syncope. PMH includes COPD, HTN, PAD, CABG, CKD III.  Clinical Impression  Pt presents to PT with deficits in activity tolerance, strength, power, endurance, gait. Pt reports a history of peripheral vascular disease along with COPD. Pt reports her legs often feel like they are going to give out, this tends to occur after working or when she has been mobilizing for longer periods of time. Pt also reports a recent history of lightheadedness. Vitals during eval are documented in general comments portion of this note. The patient is able to ambulate for household distances without physical assistance at this time. The pt will benefit from a rollator to allow for improved energy conservation when working and mobilizing at home and in the community. HHPT currently recommended.         If plan is discharge home, recommend the following: A little help with walking and/or transfers;A little help with bathing/dressing/bathroom;Assistance with cooking/housework;Assist for transportation;Help with stairs or ramp for entrance   Can travel by private vehicle        Equipment Recommendations Rollator (4 wheels)  Recommendations for Other Services       Functional Status Assessment Patient has had a recent decline in their functional status and demonstrates the ability to make significant improvements in function in a reasonable and predictable amount of time.     Precautions / Restrictions Precautions Precautions: Fall Recall of Precautions/Restrictions: Intact Restrictions Weight Bearing Restrictions Per Provider Order: No      Mobility  Bed Mobility Overal bed mobility: Independent                  Transfers Overall transfer level: Needs  assistance Equipment used: None Transfers: Sit to/from Stand Sit to Stand: Supervision                Ambulation/Gait Ambulation/Gait assistance: Supervision Gait Distance (Feet): 80 Feet (additional trials of 15' x 2) Assistive device: None Gait Pattern/deviations: Step-through pattern, Drifts right/left Gait velocity: functional Gait velocity interpretation: 1.31 - 2.62 ft/sec, indicative of limited community ambulator   General Gait Details: pt reports feeling lightheaded initially when ambulating, pt returns to a seated position and BP is 144/87. Pt then ambulates again, PT notes lateral drift but no overt losses of balance. Pt returns to room where standing BP is 144/71.  Stairs            Wheelchair Mobility     Tilt Bed    Modified Rankin (Stroke Patients Only)       Balance Overall balance assessment: Needs assistance Sitting-balance support: No upper extremity supported, Feet supported Sitting balance-Leahy Scale: Good     Standing balance support: No upper extremity supported, During functional activity Standing balance-Leahy Scale: Fair                               Pertinent Vitals/Pain Pain Assessment Pain Assessment: No/denies pain    Home Living Family/patient expects to be discharged to:: Private residence Living Arrangements: Alone Available Help at Discharge: Family;Other (Comment) (significant other, daughter) Type of Home: House Home Access: Level entry       Home Layout: One level Home Equipment: None      Prior Function Prior Level of Function : Independent/Modified Independent;Working/employed;Driving  Mobility Comments: ambulatory without DME, works at [employer], pt reports she has issues with significant fatigue after working in recent months. Often feels her legs will give out when she has been exerting herself       Extremity/Trunk Assessment   Upper Extremity Assessment Upper Extremity  Assessment: Overall WFL for tasks assessed    Lower Extremity Assessment Lower Extremity Assessment: Generalized weakness (history of PAD per the patient)    Cervical / Trunk Assessment Cervical / Trunk Assessment: Normal  Communication   Communication Communication: No apparent difficulties    Cognition Arousal: Alert Behavior During Therapy: WFL for tasks assessed/performed   PT - Cognitive impairments: No apparent impairments                         Following commands: Intact       Cueing Cueing Techniques: Verbal cues     General Comments General comments (skin integrity, edema, etc.): SpO2 90% when ambulating on room air. BP 147/84 in sitting after initial bout of ambulation, BP in standing after further ambulation is 144/71. Prior orthostatic vitals recorded by RN in vitals flowsheet as follows: Lying 148/66, Sitting 155/64, standing 3 min 125/68.    Exercises     Assessment/Plan    PT Assessment Patient needs continued PT services  PT Problem List Decreased strength;Decreased activity tolerance;Decreased balance;Decreased mobility;Decreased knowledge of use of DME;Decreased knowledge of precautions;Cardiopulmonary status limiting activity       PT Treatment Interventions DME instruction;Gait training;Functional mobility training;Therapeutic activities;Therapeutic exercise;Balance training;Neuromuscular re-education;Patient/family education    PT Goals (Current goals can be found in the Care Plan section)  Acute Rehab PT Goals Patient Stated Goal: to return to independence, improve activity tolerance PT Goal Formulation: With patient Time For Goal Achievement: 07/28/24 Potential to Achieve Goals: Fair Additional Goals Additional Goal #1: Pt will score >19/24 on the DGI to indicate a reduced risk for falls    Frequency Min 2X/week     Co-evaluation               AM-PAC PT 6 Clicks Mobility  Outcome Measure Help needed turning from your  back to your side while in a flat bed without using bedrails?: None Help needed moving from lying on your back to sitting on the side of a flat bed without using bedrails?: None Help needed moving to and from a bed to a chair (including a wheelchair)?: A Little Help needed standing up from a chair using your arms (e.g., wheelchair or bedside chair)?: A Little Help needed to walk in hospital room?: A Little Help needed climbing 3-5 steps with a railing? : A Little 6 Click Score: 20    End of Session Equipment Utilized During Treatment: Gait belt Activity Tolerance: Patient tolerated treatment well Patient left: in bed;with call bell/phone within reach Nurse Communication: Mobility status PT Visit Diagnosis: Other abnormalities of gait and mobility (R26.89);Muscle weakness (generalized) (M62.81)    Time: 9143-9079 PT Time Calculation (min) (ACUTE ONLY): 24 min   Charges:   PT Evaluation $PT Eval Low Complexity: 1 Low   PT General Charges $$ ACUTE PT VISIT: 1 Visit         Bernardino JINNY Ruth, PT, DPT Acute Rehabilitation Office [phone]   Bernardino JINNY Ruth 07/14/2024, 10:19 AM

## 2024-07-14 NOTE — Evaluation (Signed)
 Occupational Therapy Evaluation Patient Details Name: [name] MRN: [id number] DOB: Jun 12, 1951 Today's Date: 07/14/2024   History of Present Illness   74 y.o. female presents to [facility] hospital on 07/13/2024 with lightheadedness and near-syncope. PMH includes COPD, HTN, PAD, CABG, CKD III.     Clinical Impressions Pt c/o pain to L hip with certain movements. Pt lives alone, has boyfriend and daughter who can assist at times if needed. Pt's PLOF independent, works 40hrs/wk at [employer]. Pt currently requires CGA for ambulation due to mild instability, no overt LOB but at times took some missteps, or leaned to one side. Pt able to complete ADLs with CGA. Pt would benefit from rollator for return home, working with PT/mobility for balance and activity tolerance, no further acute OT needs, recommending HHOT follow up to maximize safety in home, will need rollator/4WW for return home.      If plan is discharge home, recommend the following:   Assist for transportation     Functional Status Assessment   Patient has had a recent decline in their functional status and demonstrates the ability to make significant improvements in function in a reasonable and predictable amount of time.     Equipment Recommendations   Other (comment) (rollator)     Recommendations for Other Services         Precautions/Restrictions   Precautions Precautions: Fall Recall of Precautions/Restrictions: Intact Restrictions Weight Bearing Restrictions Per Provider Order: No     Mobility Bed Mobility Overal bed mobility: Modified Independent                  Transfers Overall transfer level: Needs assistance Equipment used: None Transfers: Sit to/from Stand Sit to Stand: Contact guard assist           General transfer comment: CGA for safety      Balance Overall balance assessment: Needs assistance Sitting-balance support: No upper extremity supported, Feet supported Sitting  balance-Leahy Scale: Good     Standing balance support: No upper extremity supported, During functional activity Standing balance-Leahy Scale: Fair Standing balance comment: mild instiability when ambulating, no overt LOB                           ADL either performed or assessed with clinical judgement   ADL Overall ADL's : Needs assistance/impaired                                       General ADL Comments: set up/supervision for safety due to poor balance     Vision         Perception         Praxis         Pertinent Vitals/Pain Pain Assessment Pain Assessment: No/denies pain     Extremity/Trunk Assessment Upper Extremity Assessment Upper Extremity Assessment: Overall WFL for tasks assessed   Lower Extremity Assessment Lower Extremity Assessment: Defer to PT evaluation       Communication Communication Communication: No apparent difficulties   Cognition Arousal: Alert Behavior During Therapy: WFL for tasks assessed/performed Cognition: No apparent impairments                               Following commands: Intact       Cueing  General Comments   Cueing Techniques: Verbal cues  Exercises     Shoulder Instructions      Home Living Family/patient expects to be discharged to:: Private residence Living Arrangements: Alone Available Help at Discharge: Family;Other (Comment) Type of Home: House Home Access: Level entry     Home Layout: One level     Bathroom Shower/Tub: Chief Strategy Officer: Standard     Home Equipment: None   Additional Comments: Pt lives alone, has boyfriend and daughter who can assist at times      Prior Functioning/Environment Prior Level of Function : Independent/Modified Independent;Working/employed;Driving             Mobility Comments: ambulatory without DME, works at [employer], pt reports she has issues with significant fatigue after working in  recent months. Often feels her legs will give out when she has been exerting herself      OT Problem List: Decreased strength;Decreased range of motion;Impaired balance (sitting and/or standing);Decreased activity tolerance;Pain   OT Treatment/Interventions:        OT Goals(Current goals can be found in the care plan section)   Acute Rehab OT Goals Patient Stated Goal: to return home OT Goal Formulation: With patient Time For Goal Achievement: 07/28/24 Potential to Achieve Goals: Good   OT Frequency:       Co-evaluation              AM-PAC OT 6 Clicks Daily Activity     Outcome Measure Help from another person eating meals?: None Help from another person taking care of personal grooming?: None Help from another person toileting, which includes using toliet, bedpan, or urinal?: None Help from another person bathing (including washing, rinsing, drying)?: None Help from another person to put on and taking off regular upper body clothing?: None Help from another person to put on and taking off regular lower body clothing?: None 6 Click Score: 24   End of Session Equipment Utilized During Treatment: Gait belt Nurse Communication: Mobility status  Activity Tolerance: Patient tolerated treatment well Patient left: in bed;with call bell/phone within reach  OT Visit Diagnosis: Unsteadiness on feet (R26.81);Other abnormalities of gait and mobility (R26.89);Muscle weakness (generalized) (M62.81);Pain Pain - Right/Left: Left Pain - part of body: Hip                Time: 8664-8648 OT Time Calculation (min): 16 min Charges:  OT General Charges $OT Visit: 1 Visit OT Evaluation $OT Eval Low Complexity: 1 Low  [address], OTR/L   Elouise JONELLE Bott 07/14/2024, 2:29 PM

## 2024-07-14 NOTE — Progress Notes (Signed)
 Echocardiogram 2D Echocardiogram has been performed.  Juliene JINNY Rucks 07/14/2024, 10:57 AM

## 2024-07-14 NOTE — Progress Notes (Signed)
 Ankle Brachial Index has been completed.  Results can be found in chart review under CV Proc.  07/14/2024 3:52 PM  Edilia Elden Appl, RVT.

## 2024-07-14 NOTE — H&P (Signed)
 " History and Physical    [name] FMW:[id number] DOB: 10/28/1950 DOA: 07/13/2024  PCP: Stephane Leita DEL, MD   Patient coming from: Home   Chief Complaint: lightheaded, near-syncope   HPI: [name] is a 74 y.o. female with medical history significant for hypertension, COPD, PAD, CAD status post CABG, and CKD 3A who presents for evaluation of lightheadedness and near syncope.  Patient reports that she has been struggling with exertional dyspnea and occasional lightheadedness for several months at least.  Symptoms have worsened over the past 4 days, she has been increasingly lightheaded with activity, and has had multiple near syncope episodes.  She had a particularly severe episode today where she thought that she would certainly lose consciousness while standing, squatted down, and then slowly began to feel better.  She denies any change in her chronic cough or chronic dyspnea, has not been experiencing any chest pain, and denies fevers, chills, abdominal pain, or leg swelling.  ED Course: Upon arrival to the ED, patient is found to be afebrile and saturating well on room air with normal RR and stable BP.  EKG demonstrates sinus rhythm with possible right atrial enlargement and artifact.  Chest x-ray is notable for emphysematous changes but no acute findings.  Labs are most notable for creatinine 1.07, normal CBC, normal BMP, normal troponin x 2, and normal TSH.  Review of Systems:  All other systems reviewed and apart from HPI, are negative.  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, jaw    Peripheral vascular disease    carotid to subclavian artery bypass graft   Pneumonia    Smoker     Past Surgical History:  Procedure Laterality Date   ABDOMINAL AORTOGRAM W/LOWER EXTREMITY N/A 11/21/2022   Procedure: ABDOMINAL AORTOGRAM W/LOWER EXTREMITY;  Surgeon: Court Dorn PARAS, MD;  Location: [facility] INVASIVE CV LAB;  Service: Cardiovascular;  Laterality: N/A;   CAROTID ARTERY - SUBCLAVIAN ARTERY BYPASS GRAFT Left 04/18/2007   COLONOSCOPY  2008   CORONARY ARTERY BYPASS GRAFT     CORONARY/GRAFT ACUTE MI REVASCULARIZATION N/A 05/12/2017   Procedure: Coronary/Graft Acute MI Revascularization;  Surgeon: Wonda Sharper, MD;  Location: [facility] INVASIVE CV LAB;  Service: Cardiovascular;  Laterality: N/A;   DEBRIDEMENT MANDIBLE Left 03/23/2019   Procedure: DEBRIDEMENT LEFT MANDIBLE,SEQUESTRECTOMY;  Surgeon: Joanette Soulier, DMD;  Location: [facility] OR;  Service: Oral Surgery;  Laterality: Left;   EYE SURGERY Bilateral 2020   cataract removal   KIDNEY SURGERY Left    74 yo   LEFT HEART CATH AND CORONARY ANGIOGRAPHY N/A 05/12/2017   Procedure: LEFT HEART CATH AND CORONARY ANGIOGRAPHY;  Surgeon: Wonda Sharper, MD;  Location: [facility] INVASIVE CV LAB;  Service: Cardiovascular;  Laterality: N/A;   Left Nephrectomy Left 10/17/1983   Dr. Ronnie, Left Kidney Nonfunctioning wiht Acute and Chronic Pyelonephritis   REVERSE SHOULDER ARTHROPLASTY Left 01/16/2024   Procedure: ARTHROPLASTY, SHOULDER, TOTAL, REVERSE;  Surgeon: Kay Kemps, MD;  Location: [facility] ORS;  Service: Orthopedics;  Laterality: Left;  WISDOM TOOTH EXTRACTION     XI ROBOTIC ASSISTED INGUINAL HERNIA REPAIR WITH MESH Left 08/30/2022   Procedure: ROBOTIC LEFT INGUINAL HERNIA REPAIR WITH MESH;  Surgeon: Kinsinger, Herlene Righter, MD;  Location: [facility] ORS;  Service: General;  Laterality: Left;    Social History:   reports that she has been smoking cigarettes. She has a 20 pack-year smoking history.  She has never used smokeless tobacco. She reports current alcohol use. She reports that she does not use drugs.  Allergies[1]  Family History  Problem Relation Age of Onset   Depression Sister    COPD Father    Cancer Sister        hysterectomy sec to cancer   Cancer Sister        cervical cancer   COPD Brother      Prior to Admission medications  Medication Sig Start Date End Date Taking? Authorizing Provider  albuterol  (VENTOLIN  HFA) 108 (90 Base) MCG/ACT inhaler Inhale 1-2 puffs into the lungs every 6 (six) hours as needed for wheezing or shortness of breath. 05/05/24  Yes Kara Dorn NOVAK, MD  ALPRAZolam  (XANAX ) 0.25 MG tablet TAKE 1 TABLET BY MOUTH EVERY DAY Patient taking differently: Take 0.25 mg by mouth daily as needed for anxiety. 09/18/20  Yes Duanne Butler DASEN, MD  amLODipine  (NORVASC ) 10 MG tablet Take 1 tablet (10 mg total) by mouth daily. 04/21/24  Yes Lavona Agent, MD  aspirin  81 MG tablet Take 81 mg by mouth daily.   Yes [provider]  Aspirin -Caffeine 845-65 MG PACK Take 1 packet by mouth every 6 (six) hours as needed (headaches).   Yes [provider]  Capsaicin-Menthol 0.025-1.25 % PTCH Apply 1 patch topically daily as needed (back, shoulder, and hip pain).   Yes [provider]  carvedilol  (COREG ) 3.125 MG tablet Take 1 tablet (3.125 mg total) by mouth 2 (two) times daily with a meal. 04/21/24  Yes Lavona Agent, MD  Cholecalciferol  25 MCG (1000 UT) capsule Take 1,000 Units by mouth at bedtime.   Yes [provider]  cilostazol  (PLETAL ) 50 MG tablet Take 1 tablet (50 mg total) by mouth 2 (two) times daily. 04/21/24  Yes Court Dorn PARAS, MD  cyanocobalamin 100 MCG tablet Take 100 mcg by mouth at bedtime.   Yes [provider]  desonide (DESOWEN) 0.05 % cream Apply 1 Application topically daily as needed (rash, skin irritation on head and foot).   Yes [provider]  diphenhydrAMINE (BENADRYL) 25 mg capsule  Take 50 mg by mouth every 6 (six) hours as needed for allergies or itching.   Yes [provider]  ezetimibe  (ZETIA ) 10 MG tablet Take 10 mg by mouth at bedtime. 05/06/23  Yes [provider]  Fluticasone-Umeclidin-Vilant (TRELEGY ELLIPTA ) 200-62.5-25 MCG/ACT AEPB Inhale 1 puff into the lungs daily. 12/01/23  Yes Kara Dorn NOVAK, MD  montelukast  (SINGULAIR ) 10 MG tablet Take 1 tablet (10 mg total) by mouth at bedtime. 05/05/24  Yes Kara Dorn NOVAK, MD  Multiple Vitamins-Minerals (PRESERVISION AREDS 2) CAPS Take 1 capsule by mouth in the morning and at bedtime.   Yes [provider]  nitroGLYCERIN  (NITROSTAT ) 0.4 MG SL tablet PLACE 1 TABLET ON THE TONGUE &LET DISSOLVE EVERY 5 MINS AS NEEDED FOR CHEST PAIN 04/21/24  Yes Lavona Agent, MD  rosuvastatin  (CRESTOR ) 40 MG tablet Take 1 tablet (40 mg total) by mouth daily. Patient taking differently: Take 40 mg by mouth at bedtime. 04/21/24  Yes Lavona Agent, MD  vitamin  E 180 MG (400 UNITS) capsule Take 400 Units by mouth at bedtime.   Yes [provider]    Physical Exam: Vitals:   07/13/24 2345 07/14/24 0015 07/14/24 0030 07/14/24 0122  BP:   (!) 181/142   Pulse: 67 70 (!) 52   Resp: 15 20 13    Temp:    98.2 F (36.8 C)  TempSrc:    Oral  SpO2: 100% 100% 94%   Weight:      Height:         Constitutional: NAD, no pallor or diaphoresis   Eyes: PERTLA, lids and conjunctivae normal ENMT: Mucous membranes are moist. Posterior pharynx clear of any exudate or lesions.   Neck: supple, no masses  Respiratory: Diminished bilaterally with prolonged expiratory phase. No wheezing. Speaking in full sentences.    Cardiovascular: S1 & S2 heard, regular rate and rhythm. No extremity edema.  Abdomen: No tenderness, soft. Bowel sounds active.  Musculoskeletal: Clubbing noted. No joint deformity upper and lower extremities.   Skin: no significant rashes, lesions, ulcers. Warm, dry, well-perfused. Neurologic: CN  2-12 grossly intact. Moving all extremities. Alert and oriented.  Psychiatric: Calm. Cooperative.    Labs and Imaging on Admission: I have personally reviewed following labs and imaging studies  CBC: Recent Labs  Lab 07/13/24 1735  WBC 6.4  HGB 14.3  HCT 44.6  MCV 93.9  PLT 248   Basic Metabolic Panel: Recent Labs  Lab 07/13/24 1735 07/13/24 1835  NA 141  --   K 4.5  --   CL 106  --   CO2 28  --   GLUCOSE 129*  --   BUN 23  --   CREATININE 1.07*  --   CALCIUM  10.2  --   MG  --  2.5*   GFR: Estimated Creatinine Clearance: 35.7 mL/min (A) (by C-G formula based on SCr of 1.07 mg/dL (H)). Liver Function Tests: No results for input(s): AST, ALT, ALKPHOS, BILITOT, PROT, ALBUMIN in the last 168 hours. No results for input(s): LIPASE, AMYLASE in the last 168 hours. No results for input(s): AMMONIA in the last 168 hours. Coagulation Profile: No results for input(s): INR, PROTIME in the last 168 hours. Cardiac Enzymes: No results for input(s): CKTOTAL, CKMB, CKMBINDEX, TROPONINI in the last 168 hours. BNP (last 3 results) Recent Labs    06/23/24 1605 07/13/24 1835  PROBNP 261 299.0   HbA1C: No results for input(s): HGBA1C in the last 72 hours. CBG: No results for input(s): GLUCAP in the last 168 hours. Lipid Profile: No results for input(s): CHOL, HDL, LDLCALC, TRIG, CHOLHDL, LDLDIRECT in the last 72 hours. Thyroid Function Tests: Recent Labs    07/13/24 1835  TSH 1.070   Anemia Panel: No results for input(s): VITAMINB12, FOLATE, FERRITIN, TIBC, IRON, RETICCTPCT in the last 72 hours. Urine analysis:    Component Value Date/Time   COLORURINE YELLOW 07/14/2024 0121   APPEARANCEUR HAZY (A) 07/14/2024 0121   LABSPEC 1.013 07/14/2024 0121   PHURINE 6.0 07/14/2024 0121   GLUCOSEU NEGATIVE 07/14/2024 0121   HGBUR NEGATIVE 07/14/2024 0121   BILIRUBINUR NEGATIVE 07/14/2024 0121   KETONESUR NEGATIVE  07/14/2024 0121   PROTEINUR NEGATIVE 07/14/2024 0121   NITRITE NEGATIVE 07/14/2024 0121   LEUKOCYTESUR NEGATIVE 07/14/2024 0121   Sepsis Labs: @LABRCNTIP (procalcitonin:4,lacticidven:4) )No results found for this or any previous visit (from the past 240 hours).   Radiological Exams on Admission: DG Chest 2 View Result Date: 07/13/2024 CLINICAL DATA:  Short of breath EXAM: CHEST -  2 VIEW COMPARISON:  02/02/2021 FINDINGS: Post sternotomy changes. Emphysema. No focal opacity or pleural effusion. Stable cardiomediastinal silhouette with aortic atherosclerosis. Left shoulder replacement IMPRESSION: No active cardiopulmonary disease. Emphysema. Electronically Signed   By: Luke Bun M.D.   On: 07/13/2024 19:25    EKG: Independently reviewed. Sinus rhythm, RAE, artifact.   Assessment/Plan   1. Near syncope  - Continue cardiac monitoring, check orthostatic vitals, and update echocardiogram    2. COPD  - Not in exacerbation  - Continue ICS-LABA-LAMA and short-acting bronchodilators as-needed  3. CAD  - No anginal symptoms  - Continue ASA, statin, and beta-blocker    4. PAD  - No acute ischemia  - Continue ASA, statin, and Pletal     5. CKD 3A  - Appears close to baseline  - Renally-dose medications     DVT prophylaxis: Lovenox   Code Status: Full  Level of Care: Level of care: Telemetry Family Communication: None present   Disposition Plan:  Patient is from: Home  Anticipated d/c is to: Home  Anticipated d/c date is: 1/28 or 07/15/24 Patient currently: Pending cardiac monitoring, orthostatic vitals, echocardiogram  Consults called: None  Admission status: Observation     Evalene GORMAN Sprinkles, MD [facility]  07/14/2024, 2:44 AM       [1]  Allergies Allergen Reactions   Bisphosphonates Other (See Comments)    Osteonecrosis Jaw    "

## 2024-07-15 LAB — CBC
HCT: 39.9 % (ref 36.0–46.0)
Hemoglobin: 13.4 g/dL (ref 12.0–15.0)
MCH: 30.5 pg (ref 26.0–34.0)
MCHC: 33.6 g/dL (ref 30.0–36.0)
MCV: 90.9 fL (ref 80.0–100.0)
Platelets: 249 10*3/uL (ref 150–400)
RBC: 4.39 MIL/uL (ref 3.87–5.11)
RDW: 15.3 % (ref 11.5–15.5)
WBC: 5.9 10*3/uL (ref 4.0–10.5)
nRBC: 0 % (ref 0.0–0.2)

## 2024-07-15 LAB — BASIC METABOLIC PANEL WITH GFR
Anion gap: 8 (ref 5–15)
BUN: 20 mg/dL (ref 8–23)
CO2: 26 mmol/L (ref 22–32)
Calcium: 10.3 mg/dL (ref 8.9–10.3)
Chloride: 107 mmol/L (ref 98–111)
Creatinine, Ser: 1.04 mg/dL — ABNORMAL HIGH (ref 0.44–1.00)
GFR, Estimated: 57 mL/min — ABNORMAL LOW
Glucose, Bld: 91 mg/dL (ref 70–99)
Potassium: 4.1 mmol/L (ref 3.5–5.1)
Sodium: 142 mmol/L (ref 135–145)

## 2024-07-15 LAB — GLUCOSE, CAPILLARY: Glucose-Capillary: 87 mg/dL (ref 70–99)

## 2024-07-15 MED ORDER — ENSURE PLUS HIGH PROTEIN PO LIQD
237.0000 mL | Freq: Two times a day (BID) | ORAL | Status: DC
  Administered 2024-07-15: 237 mL via ORAL

## 2024-07-15 MED ORDER — ENSURE PLUS HIGH PROTEIN PO LIQD: 237.0000 mL | Freq: Two times a day (BID) | ORAL | 0 refills | Status: AC

## 2024-07-15 NOTE — Plan of Care (Signed)

## 2024-07-15 NOTE — Discharge Summary (Signed)
 Physician Discharge Summary  [name] FMW:[id number] DOB: 01-28-1951 DOA: 07/13/2024  PCP: Stephane Leita DEL, MD  Admit date: 07/13/2024 Discharge date: 07/15/2024 Recommendations for Outpatient Follow-up:  Follow up with PCP in 1 weeks-call for appointment Please obtain BMP/CBC in one week  Discharge Dispo: home w/ hh Discharge Condition: Stable Code Status:   Code Status: Full Code Diet recommendation:  Diet Order             Diet regular Room service appropriate? Yes; Fluid consistency: Thin  Diet effective now                    Brief/Interim Summary: [name] is a 74 y.o. female with PMH of  hypertension, COPD, PAD, CAD status post CABG, and CKD 3A who presents for evaluation of lightheadedness and near syncope.  As per admission report she has been struggling with exertional dyspnea and occasional lightheadedness for several months at least.Symptoms have worsened over the past 4 days, she has been increasingly lightheaded with activity, and has had multiple near syncope episodes.  She had a particularly severe episode 1/27 where she thought that she would certainly lose consciousness while standing, squatted down, and then slowly began to feel better.  She denies any change in her chronic cough or chronic dyspnea, has not been experiencing any chest pain, and denies fevers, chills, abdominal pain, or leg swelling. In ED: afebrile and saturating well on RA w/ normal RR and stable BP. EKG demonstrates sinus rhythm with possible right atrial enlargement and artifact. CXR-emphysematous changes but no acute findings.Labs- creatinine 1.07, normal CBC, normal BMP, normal troponin x 2, and normal TSH.  Patient was admitted for further workup.  Seen by PT OT did fairly well, echocardiogram reviewed no acute finding. ABI completed with moderate PVD no wounds on the foot, discussed with vascular Dr Pearline who will arrange outpatient follow-up  Subjective: Seen and examined She has  been ambulating herself.  She is eager to go home this morning Overnight remains afebrile vitals fairly stable tolerating her home regimen, labs unremarkable  Discharge Diagnoses:   Near syncope Deconditioning/gen weakness Chronic leg pain/PVD B/l knee pain Underweight:  VSS, no chest pain on ambulation,Orthostatic vitals as below-low positive initially but subsequently after ambulation unremarkable. Echo showed EF 60 to 65%, G1 DD RV SF is normal aortic valve is tricuspid mitral valve is normal. ABI with known PVD .She generally appears frail thin cachectic.  Suspect weakness multifactorial in the setting of poor COPD, malnutrition, PVD Continue PT OT  Hypertension: BP stable on amlodipine  10,coreg .  COPD  Ongoing active tobacco abuse: Not in exacerbation Continue ICS-LABA-LAMA and short-acting bronchodilators as-needed.  Not hypoxic   CAD  No anginal symptoms Continue ASA, statin, and beta-blocker     PAD w/ chronic left leg pain: Last seen in march/25 by Dr Lanis Chew for b/l  LE PVD- W/ short distance claudication which improved with the use of Pletal .  VVS had a long discussion about this, most notably that blockages not to be fixed as long as she is symptom-free. Last ABIs mild to moderate disease. Repeat ABI with moderate disease Continue ASA, statin, and Pletal  and follow-up with vascular- she has no wounds on the foot, discussed with vascular Dr Pearline who will arrange outpatient follow-up.   CKD 3a:  At baseline monitor intermittently  Severe malnutrition with w/ Body mass index is 14.01 kg/m.: Consult RD, augment diet   Mobility:  PT Orders: Active PT Follow up Rec: Home  Health Pt1/28/2026 0920    DVT prophylaxis: enoxaparin  (LOVENOX ) injection 40 mg Start: 07/14/24 0245 Code Status:   Code Status: Full Code Family Communication: plan of care discussed with patient at bedside. Patient status is: Remains hospitalized because of severity of illness Level of  care: Telemetry   Dispo: The patient is from: home            Anticipated disposition: home /w hh Objective: Vitals last 24 hrs: Vitals:   07/15/24 0013 07/15/24 0426 07/15/24 0750 07/15/24 0816  BP: (!) 146/66 (!) 151/80  (!) 144/67  Pulse:   62 75  Resp: 16 17 18 18   Temp: 97.9 F (36.6 C) 97.6 F (36.4 C)  97.6 F (36.4 C)  TempSrc: Oral Oral  Oral  SpO2:    95%  Weight:  39.4 kg    Height:        Physical Examination: General exam: alert awake thin frail HEENT:Oral mucosa moist, Ear/Nose WNL grossly Respiratory system: Bilaterally diminished BS,no use of accessory muscle Cardiovascular system: S1 & S2 +, No JVD. Gastrointestinal system: Abdomen soft,NT,ND, BS+ Nervous System: Alert, awake, moving all extremities,and following commands. Extremities: extremities warm, leg edema neg Skin: Warm, no rashes MSK: Normal muscle bulk,tone, power       Consultation: See note.  Discharge Instructions  Discharge Instructions     Discharge instructions   Complete by: As directed    Please call call MD or return to ER for similar or worsening recurring problem that brought you to hospital or if any fever,nausea/vomiting,abdominal pain, uncontrolled pain, chest pain,  shortness of breath or any other alarming symptoms.  Please follow-up your doctor as instructed in a week time and call the office for appointment.  Please avoid alcohol, smoking, or any other illicit substance and maintain healthy habits including taking your regular medications as prescribed.  You were cared for by a hospitalist during your hospital stay. If you have any questions about your discharge medications or the care you received while you were in the hospital after you are discharged, you can call the unit and ask to speak with the hospitalist on call if the hospitalist that took care of you is not available.  Once you are discharged, your primary care physician will handle any further medical issues.  Please note that NO REFILLS for any discharge medications will be authorized once you are discharged, as it is imperative that you return to your primary care physician (or establish a relationship with a primary care physician if you do not have one) for your aftercare needs so that they can reassess your need for medications and monitor your lab values   Increase activity slowly   Complete by: As directed       Allergies as of 07/15/2024       Reactions   Bisphosphonates Other (See Comments)   Osteonecrosis Jaw         Medication List     TAKE these medications    albuterol  108 (90 Base) MCG/ACT inhaler Commonly known as: VENTOLIN  HFA Inhale 1-2 puffs into the lungs every 6 (six) hours as needed for wheezing or shortness of breath.   ALPRAZolam  0.25 MG tablet Commonly known as: XANAX  TAKE 1 TABLET BY MOUTH EVERY DAY What changed:  when to take this reasons to take this   amLODipine  10 MG tablet Commonly known as: NORVASC  Take 1 tablet (10 mg total) by mouth daily.   aspirin  81 MG tablet Take 81 mg by  mouth daily.   Aspirin -Caffeine 845-65 MG Pack Take 1 packet by mouth every 6 (six) hours as needed (headaches).   Capsaicin-Menthol 0.025-1.25 % Ptch Apply 1 patch topically daily as needed (back, shoulder, and hip pain).   carvedilol  3.125 MG tablet Commonly known as: COREG  Take 1 tablet (3.125 mg total) by mouth 2 (two) times daily with a meal.   Cholecalciferol  25 MCG (1000 UT) capsule Take 1,000 Units by mouth at bedtime.   cilostazol  50 MG tablet Commonly known as: PLETAL  Take 1 tablet (50 mg total) by mouth 2 (two) times daily.   cyanocobalamin 100 MCG tablet Take 100 mcg by mouth at bedtime.   desonide 0.05 % cream Commonly known as: DESOWEN Apply 1 Application topically daily as needed (rash, skin irritation on head and foot).   diphenhydrAMINE 25 mg capsule Commonly known as: BENADRYL Take 50 mg by mouth every 6 (six) hours as needed for  allergies or itching.   feeding supplement Liqd Take 237 mLs by mouth 2 (two) times daily between meals for 7 days.   montelukast  10 MG tablet Commonly known as: SINGULAIR  Take 1 tablet (10 mg total) by mouth at bedtime.   nitroGLYCERIN  0.4 MG SL tablet Commonly known as: NITROSTAT  PLACE 1 TABLET ON THE TONGUE &LET DISSOLVE EVERY 5 MINS AS NEEDED FOR CHEST PAIN   PreserVision AREDS 2 Caps Take 1 capsule by mouth in the morning and at bedtime.   rosuvastatin  40 MG tablet Commonly known as: CRESTOR  Take 1 tablet (40 mg total) by mouth daily. What changed: when to take this   Trelegy Ellipta  200-62.5-25 MCG/ACT Aepb Generic drug: Fluticasone-Umeclidin-Vilant Inhale 1 puff into the lungs daily.   vitamin E 180 MG (400 UNITS) capsule Take 400 Units by mouth at bedtime.   Zetia  10 MG tablet Generic drug: ezetimibe  Take 10 mg by mouth at bedtime.        Allergies[1]  The results of significant diagnostics from this hospitalization (including imaging, microbiology, ancillary and laboratory) are listed below for reference.    Microbiology: No results found for this or any previous visit (from the past 240 hours).  Procedures/Studies: VAS US  ABI WITH/WO TBI Result Date: 07/14/2024  LOWER EXTREMITY DOPPLER STUDY Patient Name:  Tarea Skillman  Date of Exam:   07/14/2024 Medical Rec #: [id number]        Accession #:    [id number] Date of Birth: 05/21/1951         Patient Gender: F Patient Age:   16 years Exam Location:  [facility] Procedure:      VAS US  ABI WITH/WO TBI Referring Phys: Yoshua Geisinger --------------------------------------------------------------------------------  Indications: Peripheral artery disease. High Risk Factors: Coronary artery disease.  Vascular Interventions: Status post CABG. Comparison Study: Previous study on 2.6.2025. Performing Technologist: Edilia Elden Appl  Examination Guidelines: A complete evaluation includes at minimum, Doppler waveform signals  and systolic blood pressure reading at the level of bilateral brachial, anterior tibial, and posterior tibial arteries, when vessel segments are accessible. Bilateral testing is considered an integral part of a complete examination. Photoelectric Plethysmograph (PPG) waveforms and toe systolic pressure readings are included as required and additional duplex testing as needed. Limited examinations for reoccurring indications may be performed as noted.  ABI Findings: +---------+------------------+-----+----------+--------+ Right    Rt Pressure (mmHg)IndexWaveform  Comment  +---------+------------------+-----+----------+--------+ Brachial 163                    triphasic          +---------+------------------+-----+----------+--------+  PTA      94                0.58 monophasic         +---------+------------------+-----+----------+--------+ DP       87                0.53 monophasic         +---------+------------------+-----+----------+--------+ Great Toe56                0.34 Abnormal           +---------+------------------+-----+----------+--------+ +---------+------------------+-----+----------+--------+ Left     Lt Pressure (mmHg)IndexWaveform  Comment  +---------+------------------+-----+----------+--------+ Brachial 148                    biphasic  Audibly. +---------+------------------+-----+----------+--------+ PTA      88                0.54 monophasic         +---------+------------------+-----+----------+--------+ DP       124               0.76 monophasic         +---------+------------------+-----+----------+--------+ Great Toe52                0.32 Abnormal           +---------+------------------+-----+----------+--------+ +-------+-----------+-----------+------------+------------+ ABI/TBIToday's ABIToday's TBIPrevious ABIPrevious TBI +-------+-----------+-----------+------------+------------+ Right  0.58       0.34                                 +-------+-----------+-----------+------------+------------+ Left   0.76       0.32                                +-------+-----------+-----------+------------+------------+   Summary: Right: Resting right ankle-brachial index indicates moderate right lower extremity arterial disease. The right toe-brachial index is abnormal.  Left: Resting left ankle-brachial index indicates moderate left lower extremity arterial disease. The left toe-brachial index is abnormal.  *See table(s) above for measurements and observations.  Electronically signed by Debby Robertson on 07/14/2024 at 4:13:47 PM.    Final    ECHOCARDIOGRAM COMPLETE Result Date: 07/14/2024    ECHOCARDIOGRAM REPORT   Patient Name:   BIRDA DIDONATO Date of Exam: 07/14/2024 Medical Rec #:  [id number]       Height:       66.0 in Accession #:    [id number]      Weight:       105.0 lb Date of Birth:  03-05-1951        BSA:          1.521 m Patient Age:    73 years        BP:           144/70 mmHg Patient Gender: F               HR:           70 bpm. Exam Location:  Inpatient Procedure: 2D Echo, Cardiac Doppler and Color Doppler (Both Spectral and Color            Flow Doppler were utilized during procedure). Indications:    Syncope, Dyspnea  History:        Patient has prior history of Echocardiogram examinations, most  recent 10/08/2022. Cardiomyopathy, CAD and Previous Myocardial                 Infarction, Prior CABG, COPD, Signs/Symptoms:Syncope and                 Dyspnea; Risk Factors:Current Smoker, Hypertension and                 Dyslipidemia.  Sonographer:    Juliene Rucks Referring Phys: [id number] TIMOTHY S OPYD  Sonographer Comments: Suboptimal parasternal window. Image acquisition challenging due to COPD. IMPRESSIONS  1. Left ventricular ejection fraction, by estimation, is 60 to 65%. The left ventricle has normal function. The left ventricle has no regional wall motion abnormalities. Left ventricular diastolic parameters  are consistent with Grade I diastolic dysfunction (impaired relaxation).  2. Right ventricular systolic function is normal. The right ventricular size is normal.  3. The mitral valve is normal in structure. No evidence of mitral valve regurgitation. No evidence of mitral stenosis.  4. The aortic valve is tricuspid. There is mild calcification of the aortic valve. Aortic valve regurgitation is not visualized. Aortic valve sclerosis/calcification is present, without any evidence of aortic stenosis.  5. The inferior vena cava is normal in size with greater than 50% respiratory variability, suggesting right atrial pressure of 3 mmHg. FINDINGS  Left Ventricle: Left ventricular ejection fraction, by estimation, is 60 to 65%. The left ventricle has normal function. The left ventricle has no regional wall motion abnormalities. The left ventricular internal cavity size was normal in size. There is  no left ventricular hypertrophy. Left ventricular diastolic parameters are consistent with Grade I diastolic dysfunction (impaired relaxation). Right Ventricle: The right ventricular size is normal. No increase in right ventricular wall thickness. Right ventricular systolic function is normal. Left Atrium: Left atrial size was normal in size. Right Atrium: Right atrial size was normal in size. Pericardium: There is no evidence of pericardial effusion. Mitral Valve: The mitral valve is normal in structure. No evidence of mitral valve regurgitation. No evidence of mitral valve stenosis. Tricuspid Valve: The tricuspid valve is normal in structure. Tricuspid valve regurgitation is not demonstrated. No evidence of tricuspid stenosis. Aortic Valve: The aortic valve is tricuspid. There is mild calcification of the aortic valve. Aortic valve regurgitation is not visualized. Aortic valve sclerosis/calcification is present, without any evidence of aortic stenosis. Pulmonic Valve: The pulmonic valve was not well visualized. Pulmonic valve  regurgitation is not visualized. No evidence of pulmonic stenosis. Aorta: The aortic root is normal in size and structure. Venous: The inferior vena cava is normal in size with greater than 50% respiratory variability, suggesting right atrial pressure of 3 mmHg. IAS/Shunts: No atrial level shunt detected by color flow Doppler.  LEFT VENTRICLE PLAX 2D LVIDd:         5.10 cm   Diastology LVIDs:         3.90 cm   LV e' medial:    7.87 cm/s LV PW:         1.00 cm   LV E/e' medial:  7.9 LV IVS:        1.10 cm   LV e' lateral:   8.19 cm/s LVOT diam:     2.10 cm   LV E/e' lateral: 7.6 LVOT Area:     3.46 cm  RIGHT VENTRICLE            IVC RV Basal diam:  3.10 cm    IVC diam: 1.30 cm RV Mid diam:  2.00 cm RV S prime:     9.77 cm/s TAPSE (M-mode): 1.5 cm LEFT ATRIUM           Index        RIGHT ATRIUM           Index LA diam:      2.90 cm 1.91 cm/m   RA Area:     10.70 cm LA Vol (A2C): 26.8 ml 17.62 ml/m  RA Volume:   25.80 ml  16.97 ml/m LA Vol (A4C): 16.0 ml 10.52 ml/m   AORTA Ao Root diam: 3.00 cm MITRAL VALVE MV Area (PHT): 3.31 cm    SHUNTS MV Decel Time: 229 msec    Systemic Diam: 2.10 cm MV E velocity: 62.40 cm/s MV A velocity: 69.70 cm/s MV E/A ratio:  0.90 Toribio Fuel MD Electronically signed by Toribio Fuel MD Signature Date/Time: 07/14/2024/3:16:00 PM    Final    DG Chest 2 View Result Date: 07/13/2024 CLINICAL DATA:  Short of breath EXAM: CHEST - 2 VIEW COMPARISON:  02/02/2021 FINDINGS: Post sternotomy changes. Emphysema. No focal opacity or pleural effusion. Stable cardiomediastinal silhouette with aortic atherosclerosis. Left shoulder replacement IMPRESSION: No active cardiopulmonary disease. Emphysema. Electronically Signed   By: Luke Bun M.D.   On: 07/13/2024 19:25    Labs: BNP (last 3 results) No results for input(s): BNP in the last 8760 hours. Basic Metabolic Panel: Recent Labs  Lab 07/13/24 1735 07/13/24 1835 07/14/24 0422 07/15/24 0402  NA 141  --  146* 142  K 4.5   --  4.0 4.1  CL 106  --  110 107  CO2 28  --  26 26  GLUCOSE 129*  --  101* 91  BUN 23  --  20 20  CREATININE 1.07*  --  0.89 1.04*  CALCIUM  10.2  --  10.3 10.3  MG  --  2.5*  --   --    Liver Function Tests: No results for input(s): AST, ALT, ALKPHOS, BILITOT, PROT, ALBUMIN in the last 168 hours. No results for input(s): LIPASE, AMYLASE in the last 168 hours. No results for input(s): AMMONIA in the last 168 hours. CBC: Recent Labs  Lab 07/13/24 1735 07/14/24 0422 07/15/24 0402  WBC 6.4 6.5 5.9  HGB 14.3 13.9 13.4  HCT 44.6 42.5 39.9  MCV 93.9 92.2 90.9  PLT 248 243 249   CBG: Recent Labs  Lab 07/14/24 0609 07/15/24 0643  GLUCAP 96 87   Hgb A1c No results for input(s): HGBA1C in the last 72 hours. Anemia work up No results for input(s): VITAMINB12, FOLATE, FERRITIN, TIBC, IRON, RETICCTPCT in the last 72 hours. Cardiac Enzymes: No results for input(s): CKTOTAL, CKMB, CKMBINDEX, TROPONINI in the last 168 hours. BNP: Invalid input(s): POCBNP D-Dimer No results for input(s): DDIMER in the last 72 hours. Lipid Profile No results for input(s): CHOL, HDL, LDLCALC, TRIG, CHOLHDL, LDLDIRECT in the last 72 hours. Thyroid function studies Recent Labs    07/13/24 1835  TSH 1.070   Urinalysis    Component Value Date/Time   COLORURINE YELLOW 07/14/2024 0121   APPEARANCEUR HAZY (A) 07/14/2024 0121   LABSPEC 1.013 07/14/2024 0121   PHURINE 6.0 07/14/2024 0121   GLUCOSEU NEGATIVE 07/14/2024 0121   HGBUR NEGATIVE 07/14/2024 0121   BILIRUBINUR NEGATIVE 07/14/2024 0121   KETONESUR NEGATIVE 07/14/2024 0121   PROTEINUR NEGATIVE 07/14/2024 0121   NITRITE NEGATIVE 07/14/2024 0121   LEUKOCYTESUR NEGATIVE 07/14/2024 0121   Sepsis Labs Recent Labs  Lab 07/13/24 1735  07/14/24 0422 07/15/24 0402  WBC 6.4 6.5 5.9   Microbiology No results found for this or any previous visit (from the past 240 hours).   Time  coordinating discharge: 25  minutes  SIGNED: Mennie LAMY, MD  [facility] 07/15/2024, 10:26 AM  If 7PM-7AM, please contact night-coverage [email or web address]       [1]  Allergies Allergen Reactions   Bisphosphonates Other (See Comments)    Osteonecrosis Jaw

## 2024-07-15 NOTE — Discharge Instructions (Signed)
 SABRA

## 2024-07-15 NOTE — Plan of Care (Signed)
 " Problem: Education: Goal: Knowledge of condition and prescribed therapy will improve 07/15/2024 1056 by Emil Roselie SAUNDERS, RN Outcome: Adequate for Discharge 07/15/2024 1055 by Emil Roselie SAUNDERS, RN Outcome: Adequate for Discharge   Problem: Cardiac: Goal: Will achieve and/or maintain adequate cardiac output 07/15/2024 1056 by Emil Roselie SAUNDERS, RN Outcome: Adequate for Discharge 07/15/2024 1055 by Emil Roselie SAUNDERS, RN Outcome: Adequate for Discharge   Problem: Physical Regulation: Goal: Complications related to the disease process, condition or treatment will be avoided or minimized 07/15/2024 1056 by Emil Roselie SAUNDERS, RN Outcome: Adequate for Discharge 07/15/2024 1055 by Emil Roselie SAUNDERS, RN Outcome: Adequate for Discharge   Problem: Education: Goal: Knowledge of General Education information will improve Description: Including pain rating scale, medication(s)/side effects and non-pharmacologic comfort measures 07/15/2024 1056 by Emil Roselie SAUNDERS, RN Outcome: Adequate for Discharge 07/15/2024 1055 by Emil Roselie SAUNDERS, RN Outcome: Adequate for Discharge   Problem: Health Behavior/Discharge Planning: Goal: Ability to manage health-related needs will improve 07/15/2024 1056 by Emil Roselie SAUNDERS, RN Outcome: Adequate for Discharge 07/15/2024 1055 by Emil Roselie SAUNDERS, RN Outcome: Adequate for Discharge   Problem: Clinical Measurements: Goal: Ability to maintain clinical measurements within normal limits will improve 07/15/2024 1056 by Emil Roselie SAUNDERS, RN Outcome: Adequate for Discharge 07/15/2024 1055 by Emil Roselie SAUNDERS, RN Outcome: Adequate for Discharge Goal: Will remain free from infection 07/15/2024 1056 by Emil Roselie SAUNDERS, RN Outcome: Adequate for Discharge 07/15/2024 1055 by Emil Roselie SAUNDERS, RN Outcome: Adequate for Discharge Goal: Diagnostic test results will improve 07/15/2024 1056 by Emil Roselie SAUNDERS, RN Outcome: Adequate for  Discharge 07/15/2024 1055 by Emil Roselie SAUNDERS, RN Outcome: Adequate for Discharge Goal: Respiratory complications will improve 07/15/2024 1056 by Emil Roselie SAUNDERS, RN Outcome: Adequate for Discharge 07/15/2024 1055 by Emil Roselie SAUNDERS, RN Outcome: Adequate for Discharge Goal: Cardiovascular complication will be avoided 07/15/2024 1056 by Emil Roselie SAUNDERS, RN Outcome: Adequate for Discharge 07/15/2024 1055 by Emil Roselie SAUNDERS, RN Outcome: Adequate for Discharge   Problem: Activity: Goal: Risk for activity intolerance will decrease 07/15/2024 1056 by Emil Roselie SAUNDERS, RN Outcome: Adequate for Discharge 07/15/2024 1055 by Emil Roselie SAUNDERS, RN Outcome: Adequate for Discharge   Problem: Nutrition: Goal: Adequate nutrition will be maintained 07/15/2024 1056 by Emil Roselie SAUNDERS, RN Outcome: Adequate for Discharge 07/15/2024 1055 by Emil Roselie SAUNDERS, RN Outcome: Adequate for Discharge   Problem: Coping: Goal: Level of anxiety will decrease 07/15/2024 1056 by Emil Roselie SAUNDERS, RN Outcome: Adequate for Discharge 07/15/2024 1055 by Emil Roselie SAUNDERS, RN Outcome: Adequate for Discharge   Problem: Elimination: Goal: Will not experience complications related to bowel motility 07/15/2024 1056 by Emil Roselie SAUNDERS, RN Outcome: Adequate for Discharge 07/15/2024 1055 by Emil Roselie SAUNDERS, RN Outcome: Adequate for Discharge Goal: Will not experience complications related to urinary retention 07/15/2024 1056 by Emil Roselie SAUNDERS, RN Outcome: Adequate for Discharge 07/15/2024 1055 by Emil Roselie SAUNDERS, RN Outcome: Adequate for Discharge   Problem: Pain Managment: Goal: General experience of comfort will improve and/or be controlled 07/15/2024 1056 by Emil Roselie SAUNDERS, RN Outcome: Adequate for Discharge 07/15/2024 1055 by Emil Roselie SAUNDERS, RN Outcome: Adequate for Discharge   Problem: Safety: Goal: Ability to remain free from injury will improve 07/15/2024 1056 by Emil Roselie SAUNDERS, RN Outcome: Adequate for Discharge 07/15/2024 1055 by Emil Roselie SAUNDERS, RN Outcome: Adequate for Discharge   Problem: Skin Integrity: Goal: Risk for impaired skin integrity will decrease 07/15/2024 1056 by Emil Roselie SAUNDERS, RN Outcome: Adequate for  Discharge 07/15/2024 1055 by Emil Roselie SAUNDERS, RN Outcome: Adequate for Discharge   "

## 2024-07-15 NOTE — TOC CM/SW Note (Signed)
 Transition of Care [facility]) - Inpatient Brief Assessment   Patient Details  Name: [name] MRN: [id number] Date of Birth: 05/25/1951  [facility] of [facility]) CM/SW Contact:    Sudie Erminio Deems, RN Phone Number: 07/15/2024, 11:01 AM   Clinical Narrative: Patient presented for syncopal episode. PTA patient was from home alone. Patient states she has PCP and has no issues with transportation. Patient is agreeable to a rolling walker with seat- submitted via the hub for agency. [employer] will deliver to the discharge lounge. Patient has declined home health physical therapy and she is aware to call PCP if she needs additional services in the future.     Transition of Care Asessment: Insurance and Status: Insurance coverage has been reviewed Patient has primary care physician: Yes Home environment has been reviewed: reviewed Prior level of function:: independent Prior/Current Home Services: No current home services Social Drivers of Health Review: SDOH reviewed no interventions necessary Readmission risk has been reviewed: Yes Transition of care needs: no transition of care needs at this time

## 2024-07-26 ENCOUNTER — Ambulatory Visit (HOSPITAL_COMMUNITY)

## 2024-08-26 ENCOUNTER — Ambulatory Visit (HOSPITAL_COMMUNITY)

## 2024-08-26 ENCOUNTER — Ambulatory Visit: Admitting: Vascular Surgery
